# Patient Record
Sex: Male | Born: 1935
Health system: Southern US, Community
[De-identification: ages and names within clinical notes are randomized; demographics above are authoritative.]

## PROBLEM LIST (undated history)

## (undated) DIAGNOSIS — H9190 Unspecified hearing loss, unspecified ear: Secondary | ICD-10-CM

## (undated) DIAGNOSIS — C61 Malignant neoplasm of prostate: Secondary | ICD-10-CM

## (undated) DIAGNOSIS — K219 Gastro-esophageal reflux disease without esophagitis: Secondary | ICD-10-CM

## (undated) DIAGNOSIS — K5900 Constipation, unspecified: Secondary | ICD-10-CM

## (undated) DIAGNOSIS — I10 Essential (primary) hypertension: Secondary | ICD-10-CM

## (undated) DIAGNOSIS — E785 Hyperlipidemia, unspecified: Secondary | ICD-10-CM

## (undated) DIAGNOSIS — C439 Malignant melanoma of skin, unspecified: Secondary | ICD-10-CM

## (undated) DIAGNOSIS — R0789 Other chest pain: Secondary | ICD-10-CM

## (undated) HISTORY — DX: Essential (primary) hypertension: I10

## (undated) HISTORY — DX: Gastro-esophageal reflux disease without esophagitis: K21.9

## (undated) HISTORY — DX: Other chest pain: R07.89

## (undated) HISTORY — DX: Hyperlipidemia, unspecified: E78.5

## (undated) HISTORY — DX: Malignant melanoma of skin, unspecified: C43.9

## (undated) HISTORY — PX: APPENDECTOMY: SHX54

## (undated) HISTORY — DX: Constipation, unspecified: K59.00

## (undated) HISTORY — DX: Unspecified hearing loss, unspecified ear: H91.90

## (undated) HISTORY — PX: OTHER SURGICAL HISTORY: SHX169

## (undated) HISTORY — PX: POLYPECTOMY: SHX149

## (undated) HISTORY — PX: TONSILLECTOMY: SUR1361

## (undated) HISTORY — DX: Malignant neoplasm of prostate: C61

---

## 2001-05-10 ENCOUNTER — Emergency Department (HOSPITAL_COMMUNITY): Admission: EM | Admit: 2001-05-10 | Discharge: 2001-05-10 | Payer: Self-pay | Admitting: Emergency Medicine

## 2001-05-10 ENCOUNTER — Encounter: Payer: Self-pay | Admitting: Emergency Medicine

## 2002-06-22 ENCOUNTER — Encounter: Admission: RE | Admit: 2002-06-22 | Discharge: 2002-06-22 | Payer: Self-pay | Admitting: Internal Medicine

## 2002-06-22 ENCOUNTER — Encounter: Payer: Self-pay | Admitting: Internal Medicine

## 2004-04-08 ENCOUNTER — Ambulatory Visit: Payer: Self-pay | Admitting: Internal Medicine

## 2004-05-30 ENCOUNTER — Ambulatory Visit: Payer: Self-pay | Admitting: Internal Medicine

## 2004-11-07 ENCOUNTER — Ambulatory Visit: Payer: Self-pay | Admitting: Internal Medicine

## 2004-11-29 ENCOUNTER — Ambulatory Visit: Payer: Self-pay | Admitting: Internal Medicine

## 2005-03-14 ENCOUNTER — Encounter: Admission: RE | Admit: 2005-03-14 | Discharge: 2005-03-14 | Payer: Self-pay | Admitting: Internal Medicine

## 2005-03-14 ENCOUNTER — Ambulatory Visit: Payer: Self-pay | Admitting: Internal Medicine

## 2005-06-13 ENCOUNTER — Ambulatory Visit: Payer: Self-pay | Admitting: Internal Medicine

## 2005-06-19 ENCOUNTER — Ambulatory Visit: Payer: Self-pay | Admitting: Internal Medicine

## 2005-06-20 ENCOUNTER — Ambulatory Visit: Payer: Self-pay | Admitting: Internal Medicine

## 2005-09-02 ENCOUNTER — Encounter (INDEPENDENT_AMBULATORY_CARE_PROVIDER_SITE_OTHER): Payer: Self-pay | Admitting: Specialist

## 2005-09-02 ENCOUNTER — Ambulatory Visit (HOSPITAL_BASED_OUTPATIENT_CLINIC_OR_DEPARTMENT_OTHER): Admission: RE | Admit: 2005-09-02 | Discharge: 2005-09-02 | Payer: Self-pay | Admitting: Urology

## 2005-11-19 ENCOUNTER — Ambulatory Visit: Payer: Self-pay | Admitting: Internal Medicine

## 2005-12-15 ENCOUNTER — Ambulatory Visit: Payer: Self-pay | Admitting: Internal Medicine

## 2005-12-15 LAB — CONVERTED CEMR LAB
Calcium: 9.3 mg/dL (ref 8.4–10.5)
Creatinine, Ser: 1.2 mg/dL (ref 0.4–1.5)
Glucose, Bld: 98 mg/dL (ref 70–99)
Sodium: 135 meq/L (ref 135–145)

## 2006-04-10 ENCOUNTER — Encounter (HOSPITAL_COMMUNITY): Admission: RE | Admit: 2006-04-10 | Discharge: 2006-04-10 | Payer: Self-pay | Admitting: Urology

## 2006-05-19 DIAGNOSIS — I10 Essential (primary) hypertension: Secondary | ICD-10-CM

## 2006-05-19 HISTORY — DX: Essential (primary) hypertension: I10

## 2006-06-04 ENCOUNTER — Encounter: Payer: Self-pay | Admitting: Internal Medicine

## 2006-06-04 ENCOUNTER — Encounter (INDEPENDENT_AMBULATORY_CARE_PROVIDER_SITE_OTHER): Payer: Self-pay | Admitting: Specialist

## 2006-06-04 ENCOUNTER — Inpatient Hospital Stay (HOSPITAL_COMMUNITY): Admission: RE | Admit: 2006-06-04 | Discharge: 2006-06-10 | Payer: Self-pay | Admitting: Urology

## 2006-06-09 ENCOUNTER — Ambulatory Visit: Payer: Self-pay | Admitting: Internal Medicine

## 2006-06-26 ENCOUNTER — Encounter: Payer: Self-pay | Admitting: Internal Medicine

## 2006-07-01 ENCOUNTER — Encounter: Payer: Self-pay | Admitting: Internal Medicine

## 2006-07-01 DIAGNOSIS — Z9079 Acquired absence of other genital organ(s): Secondary | ICD-10-CM | POA: Insufficient documentation

## 2006-07-01 DIAGNOSIS — C61 Malignant neoplasm of prostate: Secondary | ICD-10-CM

## 2006-07-01 HISTORY — DX: Malignant neoplasm of prostate: C61

## 2006-07-15 ENCOUNTER — Encounter: Payer: Self-pay | Admitting: Internal Medicine

## 2006-08-05 ENCOUNTER — Ambulatory Visit: Payer: Self-pay | Admitting: Internal Medicine

## 2006-08-31 ENCOUNTER — Encounter: Payer: Self-pay | Admitting: Internal Medicine

## 2006-10-19 ENCOUNTER — Ambulatory Visit: Payer: Self-pay | Admitting: Gastroenterology

## 2006-10-30 ENCOUNTER — Ambulatory Visit: Payer: Self-pay | Admitting: Gastroenterology

## 2006-10-30 ENCOUNTER — Encounter: Payer: Self-pay | Admitting: Gastroenterology

## 2006-10-30 ENCOUNTER — Encounter: Payer: Self-pay | Admitting: Internal Medicine

## 2006-11-11 ENCOUNTER — Ambulatory Visit: Payer: Self-pay | Admitting: Internal Medicine

## 2006-12-22 ENCOUNTER — Telehealth (INDEPENDENT_AMBULATORY_CARE_PROVIDER_SITE_OTHER): Payer: Self-pay | Admitting: *Deleted

## 2006-12-30 ENCOUNTER — Encounter: Payer: Self-pay | Admitting: Internal Medicine

## 2007-03-10 ENCOUNTER — Ambulatory Visit: Payer: Self-pay | Admitting: Internal Medicine

## 2007-04-02 ENCOUNTER — Encounter: Payer: Self-pay | Admitting: Internal Medicine

## 2007-05-28 HISTORY — PX: PROSTATECTOMY: SHX69

## 2007-07-14 ENCOUNTER — Encounter: Payer: Self-pay | Admitting: Internal Medicine

## 2007-07-19 ENCOUNTER — Telehealth (INDEPENDENT_AMBULATORY_CARE_PROVIDER_SITE_OTHER): Payer: Self-pay | Admitting: *Deleted

## 2007-10-15 ENCOUNTER — Ambulatory Visit: Payer: Self-pay | Admitting: Internal Medicine

## 2007-10-15 LAB — HM COLONOSCOPY

## 2007-10-18 ENCOUNTER — Encounter: Payer: Self-pay | Admitting: Internal Medicine

## 2007-10-18 LAB — CONVERTED CEMR LAB: PSA: 0.02 ng/mL

## 2007-10-20 ENCOUNTER — Encounter: Payer: Self-pay | Admitting: Internal Medicine

## 2007-11-05 ENCOUNTER — Encounter: Payer: Self-pay | Admitting: Internal Medicine

## 2007-11-17 ENCOUNTER — Ambulatory Visit: Payer: Self-pay | Admitting: Internal Medicine

## 2007-11-28 HISTORY — PX: INGUINAL HERNIA REPAIR: SUR1180

## 2007-12-03 ENCOUNTER — Inpatient Hospital Stay (HOSPITAL_COMMUNITY): Admission: EM | Admit: 2007-12-03 | Discharge: 2007-12-04 | Payer: Self-pay | Admitting: Emergency Medicine

## 2007-12-03 ENCOUNTER — Ambulatory Visit: Payer: Self-pay | Admitting: Internal Medicine

## 2007-12-06 ENCOUNTER — Ambulatory Visit: Payer: Self-pay | Admitting: Internal Medicine

## 2007-12-10 ENCOUNTER — Ambulatory Visit: Payer: Self-pay | Admitting: Internal Medicine

## 2007-12-14 ENCOUNTER — Encounter (INDEPENDENT_AMBULATORY_CARE_PROVIDER_SITE_OTHER): Payer: Self-pay | Admitting: *Deleted

## 2007-12-14 LAB — CONVERTED CEMR LAB
Calcium: 9.7 mg/dL (ref 8.4–10.5)
Creatinine, Ser: 1.3 mg/dL (ref 0.4–1.5)
GFR calc non Af Amer: 58 mL/min
Glucose, Bld: 82 mg/dL (ref 70–99)
Potassium: 4.7 meq/L (ref 3.5–5.1)

## 2007-12-17 ENCOUNTER — Ambulatory Visit (HOSPITAL_COMMUNITY): Admission: RE | Admit: 2007-12-17 | Discharge: 2007-12-17 | Payer: Self-pay | Admitting: General Surgery

## 2008-01-03 ENCOUNTER — Encounter: Payer: Self-pay | Admitting: Internal Medicine

## 2008-01-14 ENCOUNTER — Ambulatory Visit: Payer: Self-pay | Admitting: Internal Medicine

## 2008-01-19 ENCOUNTER — Encounter: Payer: Self-pay | Admitting: Internal Medicine

## 2008-06-05 ENCOUNTER — Ambulatory Visit: Payer: Self-pay | Admitting: Internal Medicine

## 2008-10-07 ENCOUNTER — Ambulatory Visit: Payer: Self-pay | Admitting: Family Medicine

## 2008-10-18 ENCOUNTER — Encounter: Payer: Self-pay | Admitting: Family Medicine

## 2008-10-25 ENCOUNTER — Ambulatory Visit: Payer: Self-pay | Admitting: Internal Medicine

## 2008-10-31 ENCOUNTER — Telehealth: Payer: Self-pay | Admitting: Internal Medicine

## 2008-11-29 ENCOUNTER — Telehealth (INDEPENDENT_AMBULATORY_CARE_PROVIDER_SITE_OTHER): Payer: Self-pay | Admitting: *Deleted

## 2009-01-17 ENCOUNTER — Ambulatory Visit: Payer: Self-pay | Admitting: Internal Medicine

## 2009-01-22 ENCOUNTER — Encounter (INDEPENDENT_AMBULATORY_CARE_PROVIDER_SITE_OTHER): Payer: Self-pay | Admitting: *Deleted

## 2009-01-22 LAB — CONVERTED CEMR LAB
AST: 26 units/L (ref 0–37)
CO2: 31 meq/L (ref 19–32)
Calcium: 9.5 mg/dL (ref 8.4–10.5)
Creatinine, Ser: 1 mg/dL (ref 0.4–1.5)
Direct LDL: 148.2 mg/dL
GFR calc non Af Amer: 77.48 mL/min (ref >60)
Sodium: 134 meq/L — ABNORMAL LOW (ref 135–145)
TSH: 2.72 microintl units/mL (ref 0.35–5.50)
Total CHOL/HDL Ratio: 5
VLDL: 35.2 mg/dL (ref 0.0–40.0)

## 2009-07-18 ENCOUNTER — Ambulatory Visit: Payer: Self-pay | Admitting: Internal Medicine

## 2009-07-18 DIAGNOSIS — E785 Hyperlipidemia, unspecified: Secondary | ICD-10-CM

## 2009-07-18 HISTORY — DX: Hyperlipidemia, unspecified: E78.5

## 2009-07-24 LAB — CONVERTED CEMR LAB
Cholesterol: 216 mg/dL — ABNORMAL HIGH (ref 0–200)
Direct LDL: 150.7 mg/dL
HDL: 46.5 mg/dL (ref >39.00)
PSA: 0.01 ng/mL — ABNORMAL LOW (ref 0.10–4.00)
Total CHOL/HDL Ratio: 5

## 2009-07-25 ENCOUNTER — Telehealth (INDEPENDENT_AMBULATORY_CARE_PROVIDER_SITE_OTHER): Payer: Self-pay | Admitting: *Deleted

## 2009-10-30 ENCOUNTER — Ambulatory Visit: Payer: Self-pay | Admitting: Internal Medicine

## 2009-12-17 ENCOUNTER — Telehealth: Payer: Self-pay | Admitting: Internal Medicine

## 2010-01-03 ENCOUNTER — Telehealth: Payer: Self-pay | Admitting: Internal Medicine

## 2010-02-01 ENCOUNTER — Telehealth (INDEPENDENT_AMBULATORY_CARE_PROVIDER_SITE_OTHER): Payer: Self-pay | Admitting: *Deleted

## 2010-02-24 LAB — CONVERTED CEMR LAB
AST: 24 units/L (ref 0–37)
BUN: 13 mg/dL (ref 6–23)
CO2: 31 meq/L (ref 19–32)
Chloride: 103 meq/L (ref 96–112)
Cholesterol: 208 mg/dL (ref 0–200)
Creatinine, Ser: 1.2 mg/dL (ref 0.4–1.5)
Eosinophils Absolute: 0.3 10*3/uL (ref 0.0–0.7)
GFR calc Af Amer: 76 mL/min
GFR calc non Af Amer: 63 mL/min
Hemoglobin: 14.2 g/dL (ref 13.0–17.0)
MCHC: 34.6 g/dL (ref 30.0–36.0)
MCV: 95.5 fL (ref 78.0–100.0)
Monocytes Absolute: 0.9 10*3/uL (ref 0.1–1.0)
Monocytes Relative: 13.2 % — ABNORMAL HIGH (ref 3.0–12.0)
Neutrophils Relative %: 48.6 % (ref 43.0–77.0)
RBC: 4.31 M/uL (ref 4.22–5.81)
Sodium: 137 meq/L (ref 135–145)
VLDL: 20 mg/dL (ref 0–40)
WBC: 6.9 10*3/uL (ref 4.5–10.5)

## 2010-02-25 ENCOUNTER — Other Ambulatory Visit: Payer: Self-pay | Admitting: Internal Medicine

## 2010-02-25 ENCOUNTER — Ambulatory Visit
Admission: RE | Admit: 2010-02-25 | Discharge: 2010-02-25 | Payer: Self-pay | Source: Home / Self Care | Attending: Internal Medicine | Admitting: Internal Medicine

## 2010-02-25 LAB — LIPID PANEL
HDL: 49.8 mg/dL (ref >39.00)
Total CHOL/HDL Ratio: 4
Triglycerides: 106 mg/dL (ref 0.0–149.0)
VLDL: 21.2 mg/dL (ref 0.0–40.0)

## 2010-02-25 LAB — AST: AST: 29 U/L (ref 0–37)

## 2010-02-25 LAB — BASIC METABOLIC PANEL
BUN: 16 mg/dL (ref 6–23)
GFR: 72.22 mL/min (ref >60.00)

## 2010-02-25 LAB — CBC WITH DIFFERENTIAL/PLATELET
HCT: 41.7 % (ref 39.0–52.0)
Hemoglobin: 14.7 g/dL (ref 13.0–17.0)
MCHC: 35.3 g/dL (ref 30.0–36.0)
MCV: 93 fl (ref 78.0–100.0)
Monocytes Relative: 12.8 % — ABNORMAL HIGH (ref 3.0–12.0)
Neutro Abs: 3.9 10*3/uL (ref 1.4–7.7)
Neutrophils Relative %: 53.2 % (ref 43.0–77.0)
RDW: 13.8 % (ref 11.5–14.6)

## 2010-02-26 NOTE — Assessment & Plan Note (Signed)
Summary: FLU SHOT//PH   Nurse Visit  CC: Flu shot./kb   Allergies: 1)  ! Keflex  Orders Added: 1)  Flu Vaccine 32yrs + MEDICARE PATIENTS [Q2039] 2)  Administration Flu vaccine - MCR [G0008]          Flu Vaccine Consent Questions     Do you have a history of severe allergic reactions to this vaccine? no    Any prior history of allergic reactions to egg and/or gelatin? no    Do you have a sensitivity to the preservative Thimersol? no    Do you have a past history of Guillan-Barre Syndrome? no    Do you currently have an acute febrile illness? no    Have you ever had a severe reaction to latex? no    Vaccine information given and explained to patient? yes    Are you currently pregnant? no    Lot Number:[id number]   Exp Date:07/27/2010   Site Given  Left Deltoid IMu

## 2010-02-26 NOTE — Progress Notes (Signed)
Summary: Patient Returning Call  Phone Note Outgoing Call Call back at [facility] Phone [phone] Call back at Work Phone [phone]   Call placed by: Shonna Chock,  July 25, 2009 7:54 AM Call placed to: Patient Summary of Call: Message left on Triage VM: Late yesterday, please call  I spoke with patient and informed him labs were normal, copy printed off and mailed to patient, patient ok'd.Shonna Chock  July 25, 2009 7:55 AM

## 2010-02-26 NOTE — Assessment & Plan Note (Signed)
Summary: 6 MONTH OV//PH   Vital Signs:  Patient profile:   77 year old male Height:      64 inches Weight:      148 pounds Temp:     98.2 degrees F oral Pulse rate:   66 / minute BP sitting:   130 / 70  (left arm)  Vitals Entered By: Jeremy Johann CMA (July 18, 2009 8:06 AM) CC: 6 month f/u Comments --fasting ---discuss pain in rt ear REVIEWED MED LIST, PATIENT AGREED DOSE AND INSTRUCTION CORRECT    History of Present Illness: routine office visit occ pain in the right side of the neck, on and off, starts in the back of the ear and goes down. No ear discharge, unclear if the pain is worse with neck motion   Allergies: 1)  ! Keflex  Past History:  Past Medical History: Reviewed history from 01/17/2009 and no changes required. Hypertension Prostate cancer, hx of (2008) ?gout (see 09-2008 note) addominal pain: admited 11-09, due to constipation and ?pancreatitis   Past Surgical History: Reviewed history from 01/14/2008 and no changes required. Appendectomy Tonsillectomy neg cardiolite but had tachycardia and couplets (09/2003).Marland KitchenMarland KitchenRx toprol rt middle ear surgery Prostatectomy (05/2007), LADs (-), bone scan (-) RIGHT Inguinal herniorrhaphy  (11-09)  Social History: Reviewed history from 06/05/2008 and no changes required. Married no children, wife has  retired-- worked for the [employer] exercise: golf 2/week, treadmill at home sometimes, active diet-- ok , eats at home most times  Review of Systems       -no further episodes of gout - amb  BPs within normal -the patient remains active without difficulty breathing -Denies dysuria, hematuria, occasionally has a bladder leak if his bladder is full  Physical Exam  General:  alert, well-developed, and well-nourished.   Ears:  no redness or discharge on either ear. No tenderness to percussion at the mastoid area Neck:  full ROM and no masses.   Lungs:  normal respiratory effort, no intercostal retractions, no  accessory muscle use, and normal breath sounds.   Heart:  normal rate, regular rhythm, and no murmur.     Impression & Recommendations:  Problem # 1:  HYPERLIPIDEMIA, MILD (ICD-272.4)  slight increased LDL Diet discussed Labs  Labs Reviewed: SGOT: 26 (01/17/2009)   SGPT: 23 (01/17/2009)   HDL:46.00 (01/17/2009), 37.8 (10/15/2007)  LDL:DEL (10/15/2007)  Chol:227 (01/17/2009), 208 (10/15/2007)  Trig:176.0 (01/17/2009), 101 (10/15/2007)  Orders: Venipuncture (16109) TLB-Lipid Panel (80061-LIPID)  Problem # 2:  ADENOCARCINOMA, PROSTATE (ICD-185)  does not see Dr. Annabell Howells   routinely Would like to check his PSAs here, we agreed to do that every 6 months for now  Orders: TLB-PSA (Prostate Specific Antigen) (84153-PSA)  Problem # 3:  HYPERTENSION (ICD-401.9) at goal  ambulatory BP is normal His updated medication list for this problem includes:    Hydrochlorothiazide 25 Mg Tabs (Hydrochlorothiazide) .Marland Kitchen... Take 1 tablet by mouth once a day    Toprol Xl 25 Mg Tb24 (Metoprolol succinate) .Marland Kitchen... Take 1 tablet by mouth once a day  BP today: 130/70 Prior BP: 120/88 (01/17/2009)  Labs Reviewed: K+: 3.7 (01/17/2009) Creat: : 1.0 (01/17/2009)   Chol: 227 (01/17/2009)   HDL: 46.00 (01/17/2009)   LDL: DEL (10/15/2007)   TG: 176.0 (01/17/2009)  Problem # 4:  right-sided neck pain exam negative, patient Will call if the symptoms worsen  Complete Medication List: 1)  Hydrochlorothiazide 25 Mg Tabs (Hydrochlorothiazide) .... Take 1 tablet by mouth once a day 2)  Toprol Xl 25 Mg  Tb24 (Metoprolol succinate) .... Take 1 tablet by mouth once a day 3)  Miralax Powd (Polyethylene glycol 3350) .... Once weekly 4)  Mvi  .... One a day 5)  Potassium Over-the-counter 99 Mg  .... One a day 6)  Baby Aspirin 81 Mg Chew (Aspirin) .Marland Kitchen.. 1 p o  daily  Patient Instructions: 1)  Please schedule a follow-up appointment in 6 months .

## 2010-02-26 NOTE — Progress Notes (Signed)
Summary: EYE MD recommendation  Phone Note Call from Patient   Details for Reason: ok to leave message on machine Summary of Call: Patient left message on triage requesting the name of an eye doctor (not optician). Please advise. Initial call taken by: Lucious Groves CMA,  January 03, 2010 10:57 AM  Follow-up for Phone Call        Dr Hazle Quant or one of his associates Follow-up by: Nolon Rod. Paz MD,  January 03, 2010 3:54 PM  Additional Follow-up for Phone Call Additional follow up Details #1::        Patient notified. Additional Follow-up by: Lucious Groves CMA,  January 03, 2010 4:40 PM

## 2010-02-26 NOTE — Progress Notes (Signed)
Summary: recommendation  Phone Note Call from Patient Call back at Home Phone [phone]   Summary of Call: Patient left message on triage requesting the name of an ear doctor to go to for an ear problem. Patient did not leave any additional info. Please advise. Initial call taken by: Lucious Groves CMA,  December 17, 2009 9:53 AM  Follow-up for Phone Call        [facility], phone  [phone] any of the doctors there will be fine Follow-up by: [facility] E. Paz MD,  December 17, 2009 4:53 PM  Additional Follow-up for Phone Call Additional follow up Details #1::        Patient notified and will call their office. Additional Follow-up by: Lucious Groves CMA,  December 18, 2009 8:53 AM

## 2010-02-28 NOTE — Progress Notes (Signed)
Summary: ?Accept family member as a pt  Phone Note Call from Patient Call back at Home Phone [phone]   Summary of Call: Patient left message on triage that his 77 yo sister needs to find a MD. Her current MD is closing and he would like to know if Drue Novel will take her as a patient. He notes that she is sick and he would like her seen today if possible. Please advise on if you can accept the patient.  Initial call taken by: Lucious Groves CMA,  February 01, 2010 9:29 AM  Follow-up for Phone Call        Patient walked in and sister has Medicare/AARP insurance. Brother states that she has congestion and cold and is just worried becuase of her age.Marland KitchenMarland KitchenHarold Barban  February 01, 2010 9:33 AM  Additional Follow-up for Phone Call Additional follow up Details #1::        Spoke with Dr. Drue Novel and he agreed to take patient as a new medicare patient. Schedule is booked for today but Mr. Godinho will call back to see about Monday if she isnt any better.  Additional Follow-up by: Harold Barban,  February 01, 2010 9:46 AM

## 2010-03-06 NOTE — Assessment & Plan Note (Signed)
Summary: cpx//pt will be fasting//lch   Vital Signs:  Patient profile:   77 year old male Height:      64 inches Weight:      154 pounds BMI:     26.53 Pulse rate:   78 / minute Pulse rhythm:   regular BP sitting:   128 / 84  (left arm) Cuff size:   regular  Vitals Entered By: Army Fossa CMA (February 25, 2010 9:37 AM) CC: CPX, fasting  Comments no complaints  discuss labs [facility]   History of Present Illness: Here for Medicare AWV:  1.   Risk factors based on Past M, S, F history: reviewed  2.   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 3.   Depression/mood: no problems noted or reported  4.   Hearing: R hear decreased > 40% x years , uses a hearing aid . No problems @ normal                             conversation  5.   ADL's: independent  6.   Fall Risk: no recent, low risk  7.   Home Safety: does feels safe at home  8.   Height, weight, &visual acuity: see VS, sees eye doctor yearly, wear glasses  9.   Counseling: yes  10.   Labs ordered based on risk factors: yes 11.           Referral Coordination, if needed 12.           Care Plan, see assessment and plan 13.            Cognitive Assessment: cognition & motor skills seem appropriate. Good memory  in addition, we discussed the following Hypertension-- good medication compliance , ambulatory BPs wnl  Prostate cancer,  does not see urology routinely  ?gout  -- no episodes   Current Medications (verified): 1)  Hydrochlorothiazide 25 Mg Tabs (Hydrochlorothiazide) .... Take 1 Tablet By Mouth Once A Day. Due For Ov. 2)  Toprol Xl 25 Mg Tb24 (Metoprolol Succinate) .... Take 1 Tablet By Mouth Once A Day. Due For Ov. 3)  Miralax  Powd (Polyethylene Glycol 3350) .... Once Weekly 4)  Mvi .... One A Day 5)  Potassium Over-The-Counter 99 Mg .... One A Day 6)  Baby Aspirin 81 Mg  Chew (Aspirin) .Marland Kitchen.. 1 P O  Daily  Allergies (verified): 1)  ! Keflex  Past History:  Past Medical  History: Hypertension Prostate cancer, hx of (2008) ?gout (see 09-2008 note) R hear decreased > 40% x years  addominal pain: admited 11-09, due to constipation and ?pancreatitis   Past Surgical History: Reviewed history from 01/14/2008 and no changes required. Appendectomy Tonsillectomy neg cardiolite but had tachycardia and couplets (09/2003).Marland KitchenMarland KitchenRx toprol rt middle ear surgery Prostatectomy (05/2007), LADs (-), bone scan (-) RIGHT Inguinal herniorrhaphy  (11-09)  Family History: Reviewed history from 10/15/2007 and no changes required. Father: esophageal Ca,  prostate ca-- no colon ca--no  HTN--M DM--M CAD--M (CHF, CABG 79y/0) MGM - MI   Social History: Married no children, wife has 3 daughters   retired-- worked for the [employer] exercise: golf 2/week, treadmill at home sometimes, active diet-- ok , eats at home most times; likes pizza, pork   Review of Systems General:  Denies fatigue, fever, and weight loss. CV:  Denies chest pain or discomfort and swelling of feet. Resp:  Denies cough, shortness of breath,  and sputum productive. GI:  Denies bloody stools, diarrhea, and nausea. GU:  Denies dysuria and hematuria; occasionally "drips" since prostate ca surgery .  Physical Exam  General:  alert, well-developed, and well-nourished.   Neck:  full ROM and no masses.  normal carotid pulses, no thyromegaly Lungs:  normal respiratory effort, no intercostal retractions, no accessory muscle use, and normal breath sounds.   Heart:  normal rate, regular rhythm, and no murmur.   Abdomen:  soft, non-tender, no distention, and no masses.  no bruit  Extremities:  no pretibial edema bilaterally  Neurologic:  alert & oriented X3, strength normal in all extremities, and gait normal.   Psych:  Cognition and judgment appear intact. Alert and cooperative with normal attention span and concentration.  not anxious appearing and not depressed appearing.     Impression &  Recommendations:  Problem # 1:  HEALTH SCREENING (ICD-V70.0) Td 2003 pneumonia shot 2009 shingles shot 2-09 flu shot +  very healthy life style, praised, counseled   Cscope 10-08, Adenomatous polyp, next 2013 Dr Arlyce Dice  diet and exercise discussed  Orders: Specimen Handling (46962) Medicare -1st Annual Wellness Visit [phone])  Problem # 2:  HYPERLIPIDEMIA, MILD (ICD-272.4) all previous labs discussed, LDL goal 130 consider meds if LDL increases , otherwise states will try to work on a even healthier diet  Labs Reviewed: SGOT: 26 (01/17/2009)   SGPT: 23 (01/17/2009)   HDL:46.50 (07/18/2009), 46.00 (01/17/2009)  LDL:DEL (10/15/2007)  Chol:216 (07/18/2009), 227 (01/17/2009)  Trig:113.0 (07/18/2009), 176.0 (01/17/2009)  Orders: Venipuncture (13244) TLB-Lipid Panel (80061-LIPID) TLB-ALT (SGPT) (84460-ALT) TLB-AST (SGOT) (84450-SGOT) Specimen Handling (01027)  Problem # 3:  ADENOCARCINOMA, PROSTATE (ICD-185) labs   Orders: TLB-PSA (Prostate Specific Antigen) (84153-PSA) Specimen Handling (25366)  Problem # 4:  ? of GOUT, UNSPECIFIED (ICD-274.9) asx   Complete Medication List: 1)  Hydrochlorothiazide 25 Mg Tabs (Hydrochlorothiazide) .... Take 1 tablet by mouth once a day. due for ov. 2)  Toprol Xl 25 Mg Tb24 (Metoprolol succinate) .... Take 1 tablet by mouth once a day. due for ov. 3)  Miralax Powd (Polyethylene glycol 3350) .... Once weekly 4)  Mvi  .... One a day 5)  Potassium Over-the-counter 99 Mg  .... One a day 6)  Baby Aspirin 81 Mg Chew (Aspirin) .Marland Kitchen.. 1 p o  daily  Other Orders: TLB-BMP (Basic Metabolic Panel-BMET) (80048-METABOL) TLB-CBC Platelet - w/Differential (85025-CBCD)  Patient Instructions: 1)  Please schedule a follow-up appointment in 6 months .    Orders Added: 1)  Venipuncture [36415] 2)  TLB-Lipid Panel [80061-LIPID] 3)  TLB-BMP (Basic Metabolic Panel-BMET) [80048-METABOL] 4)  TLB-ALT (SGPT) [84460-ALT] 5)  TLB-AST (SGOT) [84450-SGOT] 6)   TLB-PSA (Prostate Specific Antigen) [84153-PSA] 7)  TLB-CBC Platelet - w/Differential [85025-CBCD] 8)  Specimen Handling [99000] 9)  Medicare -1st Annual Wellness Visit [G0438] 10)  Est. Patient Level III [44034]

## 2010-03-13 ENCOUNTER — Other Ambulatory Visit: Payer: Self-pay | Admitting: Internal Medicine

## 2010-03-13 ENCOUNTER — Encounter (INDEPENDENT_AMBULATORY_CARE_PROVIDER_SITE_OTHER): Payer: Self-pay | Admitting: *Deleted

## 2010-03-13 ENCOUNTER — Other Ambulatory Visit (INDEPENDENT_AMBULATORY_CARE_PROVIDER_SITE_OTHER): Payer: Medicare Other

## 2010-03-13 DIAGNOSIS — I10 Essential (primary) hypertension: Secondary | ICD-10-CM

## 2010-03-13 LAB — BASIC METABOLIC PANEL
CO2: 32 mEq/L (ref 19–32)
Chloride: 97 mEq/L (ref 96–112)
Creatinine, Ser: 1.1 mg/dL (ref 0.4–1.5)
Glucose, Bld: 95 mg/dL (ref 70–99)
Potassium: 4.7 mEq/L (ref 3.5–5.1)
Sodium: 136 mEq/L (ref 135–145)

## 2010-03-19 ENCOUNTER — Encounter: Payer: Self-pay | Admitting: Internal Medicine

## 2010-04-04 NOTE — Consult Note (Signed)
Summary: chronic mastoiditis, otitis---ENT  [facility]   Imported By: Lanelle Bal 03/26/2010 13:45:31  _____________________________________________________________________  External Attachment:    Type:   Image     Comment:   External Document

## 2010-05-18 ENCOUNTER — Ambulatory Visit (INDEPENDENT_AMBULATORY_CARE_PROVIDER_SITE_OTHER): Payer: Medicare Other | Admitting: Family Medicine

## 2010-05-18 VITALS — BP 108/62 | HR 66 | Temp 98.0°F | Wt 148.4 lb

## 2010-05-18 DIAGNOSIS — K529 Noninfective gastroenteritis and colitis, unspecified: Secondary | ICD-10-CM

## 2010-05-18 DIAGNOSIS — K5289 Other specified noninfective gastroenteritis and colitis: Secondary | ICD-10-CM

## 2010-05-18 NOTE — Patient Instructions (Signed)
Prevent dehydration: drink Gatorade, Pedialyte, Ginger Ale, popsicles  Immodium A-D over ther counter or Pepto-Bismol   day 1: clear liquids day 1: clear liquids--2-3 oz every 45-60 min. SMALL SIPS OR ICE CHIPS 7-up, ginger ale, sprite tea--no cofee chicken broth plain jello Water  Day 2: contnue clear liquids and add BRAT diet B--banana R--rice---can use chicken noodle or rice soup A--apple sauce T--dry toast GRITS, CREAM OF WHEAT, OATMEAL OK  Day 3: continue day 2 and add simple, non fat non spicy foods1 at a time to diet as canned peaches or pears backed or broiled chicken breast---or lunch meat boiled white potato-cook in chicken broth Chicken and rice or chicken noodles  

## 2010-05-18 NOTE — Progress Notes (Signed)
  Subjective:    Patient ID: Michael Johns, male    DOB: October 15, 1932, 77 y.o.   MRN: [id number]  HPI Since Tues, sever  Diarrhea. And also had some bad stomache. No known exposures. Urinating.   Had to get up thre times last night.  10 times or so in the last day    Review of Systems     Objective:   Physical Exam        Assessment & Plan:   Subjective:     Michael Johns is a 77 y.o. male who presents for evaluation of diarrhea 10 times per day. Symptoms have been present for 5 days. Patient denies blood in stool, constipation, dark urine, dysuria, fever, heartburn, hematemesis, hematuria and melena. Patient's oral intake has been normal. Patient's urine output has been adequate. Other contacts with similar symptoms include: none. Patient denies recent travel history. Patient has not had recent ingestion of possible contaminated food, toxic plants, or inappropriate medications/poisons.   The following portions of the patient's history were reviewed and updated as appropriate: current medications and problem list.  Review of Systems Pertinent items are noted in HPI.    Objective:   GEN: WDWN, NAD, Non-toxic, A & O x 3 HEENT: Atraumatic, Normocephalic. Neck supple. No masses, No LAD. Ears and Nose: No external deformity. CV: RRR, No M/G/R. No JVD. No thrill. No extra heart sounds. PULM: CTA B, no wheezes, crackles, rhonchi. No retractions. No resp. distress. No accessory muscle use. ABD: S, NT, ND, HYPERACTIVE BS. No rebound tenderness. No HSM.  EXTR: No c/c/e NEURO Normal gait.  PSYCH: Normally interactive. Conversant. Not depressed or anxious appearing.  Calm demeanor.     Assessment:    Acute Gastroenteritis    Plan:    1. Discussed oral rehydration, reintroduction of solid foods, signs of dehydration. 2. Return or go to emergency department if worsening symptoms, blood or bile, signs of dehydration, diarrhea lasting longer than 5 days or any new concerns. 3. Follow  up in 5 days or sooner as needed.  If no improvement

## 2010-06-11 NOTE — Discharge Summary (Signed)
NAMEPARIS, Michael Johns                 ACCOUNT NO.:  [id number]   MEDICAL RECORD NO.:  [id number]          PATIENT TYPE:  INP   LOCATION:  5123                         FACILITY:  [facility]   PHYSICIAN:  Georgina Quint. Plotnikov, MDDATE OF BIRTH:  [birth year]   DATE OF ADMISSION:  12/02/2007  DATE OF DISCHARGE:                               DISCHARGE SUMMARY   DISCHARGE MEDICATIONS:  1. Peri-Colace 1 capsule b.i.d.  2. MiraLax 17 gm p.o. once daily p.r.n. constipation.   Resume other home medications.   FOLLOW-UP PLAN:  Dr. Drue Novel next week with BMET drawing.   SPECIAL INSTRUCTIONS:  1. Avoid alcohol for one month.  2. Call if problems.  3. Resume activity slowly.   DISCHARGE DIAGNOSES:  1. Abdominal pain due to acute pancreatitis, resolved.  2. Constipation with stool impaction, resolved.  3. Hyponatremia.  4. Splenic mass on ultrasound, which proved to be a part of distended      stomach on CAT scan.   HISTORY:  Patient is a 77 year old male who was admitted on November 6  by Dr. Janee Morn with abdominal pain, chemical pancreatitis,  hyponatremia, and obstipation.  He did have severe upper abdominal  epigastric pain.  For further details, please see the rest of his  history and physical on December 03, 2007.   HOSPITAL COURSE:  During the course of hospitalization, he was kept in  IV fluids and clear liquids.  His condition has improved.  He has a  large bowel movement with a laxative.  He was found to have elevated  lipase that resolved.  He was found to have a splenic mass and  ultrasound that proved to be a distended stomach on the CAT scan.   PHYSICAL EXAMINATION:  On the day of discharge, he was feeling well.  He  was able to tolerate clear liquids.  Blood pressure 131/71, respirations 20, heart rate 59, temp 97.6.  Sats  97% on room air.  He moves well.  HEENT:  Moist.  NECK:  Supple.  LUNGS:  Clear.  HEART:  Regular.  ABDOMEN:  Soft and nontender.  No organomegaly or masses  felt.  LOWER EXTREMITIES:  Without edema.  He is alert, oriented and  cooperative.   If he tolerates a regular lunch, we will discharge home today.      Georgina Quint. Plotnikov, MD  Electronically Signed     AVP/MEDQ  D:  12/04/2007  T:  12/04/2007  Job:  [id number]

## 2010-06-11 NOTE — H&P (Signed)
[name], Michael Johns                 ACCOUNT NO.:  [id number]   MEDICAL RECORD NO.:  [id number]          PATIENT TYPE:  INP   LOCATION:  1425                         FACILITY:  [facility]   PHYSICIAN:  Excell Seltzer. Annabell Howells, M.D.    DATE OF BIRTH:  18-Oct-1932   DATE OF ADMISSION:  06/04/2006  DATE OF DISCHARGE:                              HISTORY & PHYSICAL   HISTORY OF PRESENT ILLNESS:  [name] is a 77 year old white male who  initially presented with an elevated PSA.  He was found to have a T1C  Gleason's 73 +4 adenocarcinoma of prostate involving the right base of  the prostate and he has elected to undergo radical prostatectomy.   PAST HISTORY:  1. Hypertension.  2. BPH.   SURGICAL HISTORY:  1. Middle ear surgery in 1962.  2. Appendectomy at age 89.  3 . Tonsillectomy at age 84.  1. Prostate, ultrasound and biopsy in August 2007.  2. 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   CURRENT MEDICATIONS:  1. Hydrochlorothiazide 25 mg daily.  2. Doxazosin 4 mg daily.  3. Metoprolol 12-1/2 mg daily.  4. Aspirin 81 mg daily.  5. Potassium gluconate 99 mg daily.  6. Multivitamin daily.   ALLERGIES:  He has no drug allergies.   SOCIAL HISTORY:  Negative for tobacco use.  Drinks occasional alcohol.   FAMILY HISTORY:  Unremarkable.   REVIEW OF SYSTEMS:  He has no chest pains, shortness breath, GI  complaints, but does have a history of some voiding difficulties,  otherwise without complaints.   PHYSICAL EXAMINATION:  VITAL SIGNS: Blood pressure is 108/65, heart 77,  temperature 96.8.  GENERAL: He is a well well-nourished white male with stress alert and  x3.  HEENT: Normocephalic, atraumatic.  NECK: Supple without thyromegaly or bruits.  LUNGS: Clear.  HEART: Regular rate and rhythm.  ABDOMEN: Soft, flat and nontender without mass and hepatosplenomegaly or  CVA tenderness.  No hernias or inguinal adenopathy noted.  GU: Exam reveals an unremarkable  phallus an adequate meatus.  Scrotum is  unremarkable.  Testicles bilaterally descended normal in size and  consistency without mass or tenderness.  Epididymis unremarkable.  Anus  and perineum without lesions.  RECTAL:  Reveals normal sphincter tone.  Prostate is high-riding, 2+ in  size and benign consistency without nodules. Seminal vesicles is  nonpalpable.  No rectal masses are noted.  EXTREMITIES:  Full range of motion without edema.  NEUROLOGIC:  Grossly intact.  SKIN:  Warm and dry.   IMPRESSION:  Gleason 7 T1C adenocarcinoma of prostate is 12, history of  BPH, bladder outlet obstruction.   PLAN:  He is to be admitted for robot assisted radical prostatectomy  with bilateral pelvic lymphadenectomy.      Excell Seltzer. Annabell Howells, M.D.  Electronically Signed     JJW/MEDQ  D:  07/30/2006  T:  07/31/2006  Job:  [id number]

## 2010-06-11 NOTE — Discharge Summary (Signed)
[name], Michael Johns                 ACCOUNT NO.:  [id number]   MEDICAL RECORD NO.:  [id number]          PATIENT TYPE:  INP   LOCATION:  1425                         FACILITY:  [facility]   PHYSICIAN:  Excell Seltzer. Michael Johns, M.D.    DATE OF BIRTH:  1932-09-03   DATE OF ADMISSION:  06/04/2006  DATE OF DISCHARGE:  06/10/2006                               DISCHARGE SUMMARY   HISTORY OF PRESENT ILLNESS:  [name] is a 77 year old, white male with  a T1c Gleason 7 adenocarcinoma of the prostate who is admitted for  radical prostatectomy.   PAST MEDICAL HISTORY:  Significant for hypertension.  Please see his H&P  for details.   ADMISSION MEDICATIONS:  1. Hydrochlorothiazide 25 mg daily.  2. Doxazosin 4 mg daily.  3. Metoprolol 12.5 mg daily.  4. Aspirin 81 mg daily.  5. Potassium gluconate.  6. Multivitamin.   ALLERGIES:  No known drug allergies.   HOSPITAL COURSE:  The patient was taken to the operating room on the day  of admission and underwent an uneventful robotic-assisted radical  prostatectomy and bilateral pelvic lymphadenectomy.  His blood loss was  estimated at 200 mL.  Postoperatively, he did well.  On the evening of  surgery, he had good urine output and was afebrile.  However, he began  to have some bloody drainage with clots later that evening and required  irrigation of his catheter.  On the postop day #1, he was doing well.  He was afebrile.  His abdomen was soft with bowel sounds.  His JP output  was decreasing.  His urine output was felt to be adequate following  irrigation.  His hemoglobin was 11.5.  His Jackson-Pratt was removed.  However, later that day, the patient had the new-onset of severe lower  abdominal pain with rectal urgency.  The catheter was able to be  irrigated quantitatively with return of a small clot.  A CBC and CMP  were obtained.  A bladder scan was repeated which revealed no  significant residual and it was felt a CT of the abdomen with contrast  to rule  out bowel injury was indicated.  His hemoglobin was down to 9.8.  The initial CT scan report was not read as revealing extravasation and  the patient was managed over the weekend for an ileus.  During that  time, he remained afebrile, but continued to have pain and a persistent  ileus.  On postop day #4, his temperature was 97.9, pulse was regular,  respirations 22, blood pressure 127/57.  I reviewed his CT scan and  noted that he had significant extravasation of contrast of urine at the  anastomosis along with a persistent ileus.  He had been taken off his  Cipro, that was resumed.  A repeat CBC and CMP were obtained along with  order for a CT cystogram.  A medical consult was obtained because the  patient was found to have hyponatremia with a sodium of 122 on May 11.  He also was noted to have some hypokalemia that was felt to be secondary  to his  hydrochlorothiazide.  He was given Lasix and IV fluids.  On the  evening of May 12, his sodium increased to 127.  He was passing gas and  feeling better.  The CT cystogram revealed some mild extravasation at  the posterior bladder neck and anastomotic area with diminished  retroperitoneal fluid and a stable hematoma in the retropubic space.  His IV had infiltrated and was removed.  At this point, we felt we could  safely manage him with oral intake.  His diet was initiated and  advanced.  He was changed to p.o. Cipro.  A repeat BMP and CBC were  obtained on May 13, with his hemoglobin 8.9.  Sodium was up to 132,  potassium was 2.9.  He was given potassium supplement and changed to a  regular diet and had continued medical followup.  On May 14, his T-max  was 100.4, but he was afebrile that morning.  His vital signs were  stable.  His exam was unremarkable with reduced suprapubic tenderness.  His sodium was up to 131 and his potassium was up to 3.8.  His final  pathology returned consistent with a T2c, Gleason 7, N0 MX  adenocarcinoma of the  prostate.  He was felt to be ready for discharge  home.  His admission was complicated by an anastomotic leak likely  related to irrigation from clot retention.  He had acute blood loss  anemia, a postoperative ileus, hyponatremia and hypokalemia.   DISCHARGE MEDICATIONS:  Discharge medications include Colace, Vicodin,  Keflex, Cipro and resumption of his home medications.   SPECIAL INSTRUCTIONS:  His Foley catheter was left indwelling and he was  instructed to follow up in 1 week for evaluation.   DISPOSITION:  Home.   CONDITION ON DISCHARGE:  Prognosis is good.  Condition is improved.   FOLLOW UP:  He was encouraged to follow up as an outpatient with Dr. Drue Novel  as well.      Excell Seltzer. Michael Johns, M.D.  Electronically Signed     JJW/MEDQ  D:  07/30/2006  T:  07/31/2006  Job:  [id number]   cc:   Willow Ora, MD  [phone] [address] [place], [place] [address]

## 2010-06-11 NOTE — Op Note (Signed)
Michael Johns, Michael Johns                 ACCOUNT NO.:  [id number]   MEDICAL RECORD NO.:  [id number]          PATIENT TYPE:  AMB   LOCATION:  DAY                          FACILITY:  [facility]   PHYSICIAN:  Adolph Pollack, M.D.DATE OF BIRTH:  1932-05-04   DATE OF PROCEDURE:  12/17/2007  DATE OF DISCHARGE:                               OPERATIVE REPORT   PREOPERATIVE DIAGNOSIS:  Right inguinal hernia.   POSTOPERATIVE DIAGNOSIS:  Indirect right inguinal hernia.   PROCEDURE:  Right inguinal repair with mesh.   SURGEON:  Adolph Pollack, M.D.   ANESTHESIA:  Gentle/LMA with Marcaine local.   INDICATION:  Mr. Kracht is a 77 year old male who developed some  testicular pain.  He was noted to have a moderate size right inguinal  hernia. He would like to have it repaired and thus presents for repair.  We discussed the procedure, risks and aftercare preoperatively.   TECHNIQUE:  He was seen in the holding area and the right groin marked  with my initials.  He was then brought to the operating room, placed  supine on the operating table, and general anesthetic by way of LMA was  administered.  The hair in the right groin was clipped and the area  sterilely prepped and draped.  Marcaine solution was infiltrated  superficially and deep in the right groin.  Right groin incision was  made through the skin and subcutaneous tissue down to level of external  oblique aponeurosis.  Local anesthetic was infiltrated deep to the  external oblique aponeurosis.  The external oblique aponeurosis was then  divided in the direction of its fibers through the external ring  medially and up toward the anterior superior iliac spine laterally.  Using blunt dissection, the shelving edge of the inguinal ligament was  exposed inferiorly, and internal oblique muscle and aponeurosis were  exposed superiorly.  The ilioinguinal nerve was identified and kept with  the spermatic cord.  Spermatic cord was isolated, and a  posterior window  created around it.  Indirect sac was identified and dissected free from  the cord and reduced back through a patulous internal ring.   Next, a piece of 3 x 6 polypropylene mesh was brought into the field and  anchored 2 cm medial to the pubic tubercle with 2-0 Prolene suture.  The  inferior aspect of mesh was then anchored to the shelving edge of the  inguinal ligament with a running 2-0 Prolene suture up to the level 1-2  cm lateral to the internal ring.  A slit was cut in the mesh and 2 tails  wrapped around the cord.  The superior aspect of mesh was anchored to  the internal oblique aponeurosis with interrupted 2-0 Vicryl sutures.  The 2 tails were crossed creating a new internal ring, and they were  anchored to the shelving edge of the inguinal ligament with a single 2-0  Prolene suture.  The tip of the hemostat was able to be placed in the  new aperture.   Hemostasis was adequate at this time.  The lateral aspect of mesh was  then tucked deep to the external oblique aponeurosis.  The external  oblique aponeurosis was closed over the mesh with running 3-0 Vicryl  suture.  Scarpa fascia was closed with running 3-0 Vicryl suture.  Skin  was closed with 4-0 Monocryl subcuticular stitch followed by Steri-  Strips and sterile dressings.  The right testicle was in its normal  position in the scrotum.   He tolerated the procedure well without any apparent complications and  was taken to the recovery in satisfactory condition.      Adolph Pollack, M.D.  Electronically Signed     TJR/MEDQ  D:  12/17/2007  T:  12/17/2007  Job:  [id number]

## 2010-06-11 NOTE — H&P (Signed)
NAMESABIN, GIBEAULT NO.:  [id number]   MEDICAL RECORD NO.:  [id number]          PATIENT TYPE:  EMS   LOCATION:  MAJO                         FACILITY:  [facility]   PHYSICIAN:  Ramiro Harvest, MD    DATE OF BIRTH:  Apr 05, 1932   DATE OF ADMISSION:  12/03/2007  DATE OF DISCHARGE:                              HISTORY & PHYSICAL   PRIMARY CARE PHYSICIAN:  Dr. Drue Novel of [facility].   HISTORY OF PRESENT ILLNESS:  Michael Johns is a 77 year old white male  with a history of prostate cancer status post prostatectomy, history of  hypertension who presents to the ED with a several hour history of  epigastric and upper abdominal pain constant in nature and occurring at  rest.  The patient denied any nausea, no fever, no chills, no  diaphoresis, no headache, no visual changes, no melena, no hematemesis,  no hematochezia, no palpitations, no focal neurological symptoms.  The  patient does endorse some shortness of breath with abdominal pain and  some emesis while taking the p.o. contrast in the ED.  Otherwise, the  patient has been stable.  In the ED the patient was given GI cocktail,  Dilaudid, Zofran with some improvement in his symptoms.  Lipase levels  obtained were 382.  CBC was unremarkable.  CMET with sodium of 130,  chloride of 93; otherwise, was within normal limits.  Acute abdominal  series is a large fecal burden and nonobstructive bowel gas pattern.  Abdominal ultrasound was negative for cholecystitis and a 9 to 8 cm  cystic lesion within the spleen hilum.  CT of the abdomen and pelvis was  negative for splenic mass, and large tubal was noted within the rectum.   EKG was negative.  We are called to admit the patient for further  evaluation and management.   ALLERGIES:  CEPHALEXIN.   PAST MEDICAL HISTORY:  1. Hypertension.  2. BPH.  3. Adenocarcinoma of the prostate status post radical prostatectomy      per Dr. Annabell Howells in May of 2008.  4. Nasal polyp  status post removal in 1993.  5. A middle ear surgery in 1962.  6. Appendectomy at age 35.  7. Tonsillectomy at age 36.   HOME MEDICATIONS:  1. HCTZ 25 mg p.o. daily.  2. Metoprolol 25 mg p.o. daily.  3. Aspirin 81 mg p.o. daily.  4. Multivitamin 1 tab p.o. daily.  5. KCl 99 mg p.o. daily.  6. Fish oil 1 g p.o. daily.  7. Stool softener b.i.d.   SOCIAL HISTORY:  The patient is married, lives in [place].  No tobacco  use .  Occasional/ social alcohol use.  No IV drug use.   FAMILY HISTORY:  Noncontributory.   REVIEW OF SYSTEMS:  As per HPI; otherwise, negative.   PHYSICAL EXAMINATION:  Temperature 97.7, blood pressure 142/75, pulse of  65, respiratory rate 20, satting 98% on room air.  GENERAL:  Patient in no apparent distress, sleeping on gurney.  HEENT:  Normocephalic, atraumatic.  Pupils equal, round, and reactive to  light and accommodation.  Extraocular movements  intact.  Oropharynx is  clear.  No lesions, no exudates.  NECK:  Supple.  Dry mucous membranes.  RESPIRATORY:  Lungs are clear to auscultation bilaterally.  ABDOMEN:  Soft, nontender, nondistended.  Positive bowel sounds.  EXTREMITIES:  No clubbing or cyanosis.  NEUROLOGICAL:  The patient is alert and oriented x3.  Cranial nerves II-  XII are grossly intact.  No focal deficits.   LABORATORIES:  Lipase 382.  Sodium 130, potassium 3.6, chloride 93,  bicarb 27, BUN 13, creatinine 0.94, glucose 126.  Bilirubin 0.6, alk  phosphatase 54.  AST 32, ALT 22.  Calcium 9.6.  Protein 7.1.  Albumin  4.0.  CBC:  White count 8.1, hemoglobin 13.3, platelets 243, hematocrit  40.7, ANC of 4.4.  Direct LDL 136.5.   Acute abdominal series shows large fecal burden, nonobstructive bowel  gas pattern, left basal atelectasis.  Abdominal ultrasound shows no  cholecystitis or cholelithiasis, 9.8 cm long axis cystic lesion present  within the splenic hilum.  Differential includes pseudocyst or a  peristaltic stomach.  CT of the abdomen  and pelvis showed no mass lesion  in the splenic hilum.  This could represent a distended stomach with  hyperperistalsis at the time of ultrasound.  Cause for gastric  distention is unclear, no acute pelvic abnormality.  Large stool ball  within the rectum.  EKG:  Normal sinus rhythm.   ASSESSMENT AND PLAN:  Michael Johns is a 77 year old gentleman with a  history of adenocarcinoma of the prostate status post resection, history  of hypertension, and benign prostatic hypertrophy who presents to the  emergency department with poor abdominal pain.   1. Abdominal pain likely secondary to constipation.  The patient with      an elevated lipase.  However, computed tomography of the abdomen      and pelvis is negative for an acute pancreatitis.  Will recheck      lipase and amylase levels, cycle cardiac enzymes q.12 hours x2.      Check an EKG.  Hydrate with intravenous fluids.  Place on MiraLax      and follow.  2. Chemical pancreatitis.  Will place on intravenous fluids,      supportive care.  Computed tomography of the abdomen and pelvis      negative for any acute pancreatitis.  Will check a fasting lipid      panel and follow.  3. Constipation.  Will place on MiraLax.  4. Hyponatremia likely secondary to hypovolemia.  Check a urine      sodium.  Check a TSH.  Place on intravenous fluids and follow.  5. Benign prostatic hypertrophy.  6. Adenocarcinoma of the prostate status post prostatectomy.  7. Prophylaxis Protonix for gastrointestinal prophylaxis.  Sequential      compression devices for deep venous thrombosis prophylaxis.   It has been a pleasure taking care of Michael Johns.      Ramiro Harvest, MD  Electronically Signed     DT/MEDQ  D:  12/03/2007  T:  12/03/2007  Job:  [id number]   cc:   Willow Ora, MD

## 2010-06-14 NOTE — Op Note (Signed)
[name], [name]                 ACCOUNT NO.:  [id number]   MEDICAL RECORD NO.:  [id number]          PATIENT TYPE:  INP   LOCATION:  1425                         FACILITY:  [facility]   PHYSICIAN:  Michael Johns, M.D.    DATE OF BIRTH:  01/28/1932   DATE OF PROCEDURE:  06/04/2006  DATE OF DISCHARGE:                               OPERATIVE REPORT   PREOPERATIVE DIAGNOSIS:  Prostate cancer.   POSTOPERATIVE DIAGNOSIS:  Prostate cancer.   OPERATION PERFORMED:  Robot assisted laparoscopic radical prostatectomy  with bilateral pelvic lymphadenectomy.   SURGEON:  Michael Johns, M.D.   ASSISTANT:  Crecencio Mc, M.D.   ANESTHESIA:  General.   DRAINS:  Foley catheter and Blake drain.   BLOOD LOSS:  200 mL.   COMPLICATIONS:  None.   INDICATIONS FOR PROCEDURE:  Michael Johns is a 77 year old white male with a  Gleason 7 adenocarcinoma of the prostate who has elected to undergo  radical prostatectomy.   FINDINGS AND PROCEDURE:  The patient was given Ancef.  He was fitted  with PAS hose.  He was taken to operating room where general anesthetic  was induced.  He was placed in lithotomy position.  His abdomen was  shaved.  He was prepped with Betadine solution.  He was then placed in  steep Trendelenburg position and was draped in the usual sterile  fashion.  A 22 French Foley catheter was inserted.  The balloon was  filled with 30 mL of sterile fluid and the bladder was drained.   A periumbilical incision was made 18 cm above the pubis.  This was  carried down through the fascia with a Bovie.  The peritoneum was nicked  with the scalpel and a hemostat was passed through the posterior sheath  into the peritoneal cavity.  A finger was placed to confirm appropriate  placement and a 12 mm laparoscopic port was inserted.  This was secured  using a buttressing 2-0 Vicryl figure-of-eight suture.  Once in  position, the laparoscopic camera was inserted.  Examination revealed  significant adhesions  in the right lower quadrant from the patient's  prior appendectomy.   The robot port sites were marked and injected with local anesthetic.  The 5 mm port was placed in its usual location as were the two left-  sided 8 mm robot arm ports.  Using the 5 mm port and one of the robot  arm ports, a laparoscopic adhesiolysis was performed to release the  adhesions from the right lower quadrant.  The remaining right lower  quadrant 8 mm robot port and 12 mm working port were placed in the right  abdomen after lysis of adhesions.   At this point, the robot was brought onto the field and docked.  A  monopolar scissors was placed in the right robot port, and a PK  dissector was placed in the medial left port and a ProGrasp was placed  in the lateral robotic port.  The bladder was then filled with sterile  fluid and the peritoneum was incised over the anterior abdominal wall.  The obliterated  umbilical ligaments were divided using sharp and cautery  dissection and the bladder was reflected off the anterior abdominal wall  exposing the pubic symphysis and anterior prostate.  The bladder was  then drained.  The endopelvic fascia was opened on the right side and  the lateral prostatic attachments were freed out to the apex.  The left  endopelvic fascia was then incised and the lateral prostatic attachments  were taken down using sharp and blunt dissection from the base to the  apex.  The puboprostatic ligaments were divided and the groove between  the dorsal vein complex and the urethra was identified on each side.  An  endoscopic GIA stapler was then passed and the dorsal vein complex was  engaged and stapled.   At this point we turned our attention to the bladder neck which was  opened anteriorly using the monopolar cautery.  Initially, we were a bit  distal into the prostate so some residual prostatic tissue was removed  from the bladder neck side.  Once the anterior dissection had been   performed and the Foley catheter was exposed, it was brought onto the  field and used to provide anterior traction.  The posterior bladder neck  was then divided and the bladder was dropped away from the prostate  exposing the ampulla of the vas.  Indigo carmine was given to allow  better visualization of urine in the field and confirm ureteral patency.  Once the bladder neck had been freed anterior and posteriorly was when  the residual prostatic tissue was removed from the bladder neck.  It was  sent as a margin specimen.   The left vasal ampulla was then grasped and dissected out and then  divided and used to provide anterior traction while the left seminal  vesicle was dissected out using monopolar cautery.  The fourth robot arm  was then used to provide anterior traction on the left structures while  the right vasal ampulla was dissected out and divided followed by the  left seminal vesicle.  Once both  groups of structures were dissected  out, the Prestige grasper was used through the 12 mm right port and the  PK dissector were then used to provide anterior traction on the  prostate.  The monopolar scissors and ProGrasp instrument were then used  to develop the plane between the rectum and the posterior prostate.  There were some adhesions on the right that were taken down sharply.  Once this plane had been developed, we turned our attention to the nerve  spare.   The prostatic fascia was incised anteriorly on the right and the  neurovascular bundle was developed and dissected off the prostate back  to the level of the pedicle and out to the apex.  This was then repeated  on the left side.  Good bundles were developed. We then turned our  attention to the pedicle dissection.  The left prostatic pedicle was  divided into packets using the scissors and clips and the prostate was  reflected anteriorly as we marched up the pedicle.  Once the left pedicle had been taken down leaving  only some minor apical attachments,  the right pedicle was taken down in identical fashion.  Care was taken  to avoid injury to the neurovascular bundles.  Once again apical  attachments remained after division of the pedicle.   We then completed the division of the dorsal vein complex using the  monopolar scissors and some residual apical lateral  attachments were  taken down sharply.  Once small area of bleeding on the left was  controlled with bipolar cautery with care taken to stay away from the  neurovascular bundle.  The anterior urethra was then opened with cold  scissors and the Foley catheter was then backed into the urethra.  The  posterior urethra was then divided along with a couple of minor apical  posterior attachments and the prostate was then moved out of the field.  No significant bleeding was noted.  Irrigation was placed in the  prostatic fossa and the rectal bulb was pumped, no evidence of air  bubbles were noted.  The irrigant was then aspirated.   We then turned our attention to the pelvic lymph node dissection.  The  right iliac vessel was identified and the plane between the vessel and  the lymphatic packet was developed using the PK dissector and monopolar  scissors.  Once the pelvic sidewall was identified, the obturator nerve  was identified on the medial aspect of the bundle and was dissected  free.  The distal end of the pedicle was controlled with two large  Hemoclips.  The pedicle was then further developed back to the  bifurcation and then divided with large clips and removed.  This was  taken off the field and sent for permanent section.   The left iliac vein was identified and the plane between the vein and  the lymphatic package was developed out to the pelvic side wall.  The  obturator nerve was identified medially and dissected free from the  packet.  Once fully encircled, the distal lymphatic channels were  divided between large clips and the packet  was then developed back to  the bifurcation and divided between large clips and removed from the  field.  No obvious nodal disease was noted.  Both of the dissection  sites were inspected and no active bleeding was noted.   We then turned our attention to the urethrovesical anastomosis.  A 3-0  Vicryl on an SH needle was used to place a single stitch at the 12  o'clock position on the bladder neck and urethral stump.  Then using a  slip knot, the bladder neck was brought down to the urethral stump and  secured.   Once this initial tacking stitch was placed, a 4-0 Monocryl double armed  suture was then used to perform a running urethrovesical anastomosis in  the usual fashion.  This was done without complications and when tied,  irrigation demonstrated a watertight anastomosis.  The ends of the  suture were trimmed and the needles were removed as per routine.  At  this point the 20-French Coude Foley catheter was inserted without difficulty.  The bladder irrigation was performed as noted and no  extravasation was apparent.   Final inspection revealed no active bleeding.  No evidence of urinary  extravasation was noted.  The robot as undocked and removed from the  field.  The camera was moved to the 12 mm working port and a lap  entrapment sac was passed through the periumbilical port.  The prostate  was then placed within the entrapment sac which was deployed and the  working port was then reinserted alongside the entrapment pack string.  The 12 mm working port was then removed and the port site was closed  using 2-0 Vicryl and a suture passer.  Prior to placement of the  entrapment sac, a #10 round Blake drain had been placed through  the  fourth arm working port and positioned in the pelvis.  The working port  was removed and the drain was secured with 3-0 nylon suture.   After closure of the 12 mm working port, the remaining two 8 mm robot  ports and the 5 mm port removed under  direct inspection, no bleeding was  noted.  The periumbilical port was then removed and the figure-of-eight  suture was released.  The entrapment sac was brought up to the incision  and using an army-navy retractor and finger for guidance, the fascia was  opened sufficiently to allow removal of the specimen.   Once the specimen was removed, the anterior rectus fascia was closed  using a running 0-0 Vicryl.  The port sites were infiltrated with 0.25%  Marcaine and all were closed with skin clips.  The Blake drain was  placed to bulb suction.  The Foley catheter was placed to straight  drainage and secured to the patient's thigh after removal of the drapes.  The patient was taken out of Trendelenburg position.  The drapes were  removed. The Foley catheter was secured as noted.  The rectal catheter  was removed.  He was taken down from lithotomy position, his anesthetic  was reversed.  He was removed to recovery room in stable condition.  There were no complications.      Michael Johns, M.D.  Electronically Signed     JJW/MEDQ  D:  06/04/2006  T:  06/05/2006  Job:  [id number]

## 2010-06-14 NOTE — Op Note (Signed)
Michael Johns, Michael Johns                 ACCOUNT NO.:  [id number]   MEDICAL RECORD NO.:  [id number]          PATIENT TYPE:  AMB   LOCATION:  NESC                         FACILITY:  [facility]   PHYSICIAN:  Excell Seltzer. Annabell Howells, M.D.    DATE OF BIRTH:  Mar 04, 1932   DATE OF PROCEDURE:  09/02/2005  DATE OF DISCHARGE:                                 OPERATIVE REPORT   PROCEDURE:  Transrectal ultrasound of the prostate, with saturation prostate  biopsy.   PREOPERATIVE DIAGNOSIS:  Elevated prostate specific antigen with atypia,  suspicious for adenocarcinoma on prior biopsy.   POSTOPERATIVE DIAGNOSIS:  Elevated prostate specific antigen with atypia,  suspicious for adenocarcinoma on prior biopsy.   SURGEON:  Excell Seltzer. Annabell Howells, M.D.   ANESTHESIA:  General.   SPECIMEN:  Prostate biopsies from the right and left base, the right and  left mid to base, right and left mid apex, and right left apex, three cores  from each site.   COMPLICATIONS:  None.   INDICATIONS:  [name] is a 77 year old white male with a rising PSA who  was found to have atypia suspicious for prostate cancer on a prior 12-core  biopsy. Several sites were positive, but a definitive diagnosis of cancer  could not be main.  It was felt that a more extensive biopsy was indicated.   FINDINGS AND PROCEDURE:  The patient is given p.o. Cipro preoperatively and  Rocephin prior the procedure, and was taken operating room where general  anesthetic was induced.  He was placed in a lateral decubitus position, with  the left side down. The transrectal ultrasound probe was assembled and  inserted, and scanning was performed.  Examination revealed normal-appearing  seminal vesicles.  The prostate was symmetric. There was slight  hypoechogenicity of the left base of the prostate that extended into the  midportion. There were a few scattered calcifications at the junction of the  peripheral and transitional zone, and some enlargement of the  transitional  zone which had a normal variegated echotexture. The sagittal views revealed  no additional abnormalities.  There was a slightly hypoechoic appearance to  the left peripheral zone on the lateral sagittal view. Prostate-seminal  vesicle angle was unremarkable bilaterally. Prostate volume was 36 cc.   After completion of the diagnostic scan, the biopsy was performed, as noted  in the specimen comment.  He had biopsies obtained from four planes on each  side, with three cores take in each plane. The ultrasound probe was removed  after completion of all the biopsies.  He was rolled back supine. His  anesthetic was reversed.  He was taken to the recovery room in stable  condition.  There were no complications.      Excell Seltzer. Annabell Howells, M.D.  Electronically Signed     JJW/MEDQ  D:  09/02/2005  T:  09/02/2005  Job:  [id number]   cc:   Juline Patch, M.D.  Fax: [phone]

## 2010-07-06 ENCOUNTER — Emergency Department (HOSPITAL_COMMUNITY): Payer: Medicare Other

## 2010-07-06 ENCOUNTER — Inpatient Hospital Stay (HOSPITAL_COMMUNITY)
Admission: EM | Admit: 2010-07-06 | Discharge: 2010-07-07 | DRG: 312 | Disposition: A | Discharge status: Home / Self Care | Payer: Medicare Other | Attending: Internal Medicine | Admitting: Internal Medicine

## 2010-07-06 DIAGNOSIS — I10 Essential (primary) hypertension: Secondary | ICD-10-CM | POA: Diagnosis present

## 2010-07-06 DIAGNOSIS — R55 Syncope and collapse: Principal | ICD-10-CM | POA: Diagnosis present

## 2010-07-06 DIAGNOSIS — K5641 Fecal impaction: Secondary | ICD-10-CM | POA: Diagnosis present

## 2010-07-06 DIAGNOSIS — E871 Hypo-osmolality and hyponatremia: Secondary | ICD-10-CM | POA: Diagnosis present

## 2010-07-06 DIAGNOSIS — Z8546 Personal history of malignant neoplasm of prostate: Secondary | ICD-10-CM

## 2010-07-06 LAB — CBC
HCT: 37.6 % — ABNORMAL LOW (ref 39.0–52.0)
MCH: 31 pg (ref 26.0–34.0)
MCHC: 37.5 g/dL — ABNORMAL HIGH (ref 30.0–36.0)
RDW: 13.4 % (ref 11.5–15.5)

## 2010-07-06 LAB — URINALYSIS, ROUTINE W REFLEX MICROSCOPIC: pH: 7.5 (ref 5.0–8.0)

## 2010-07-06 LAB — POCT I-STAT, CHEM 8
BUN: 21 mg/dL (ref 6–23)
Calcium, Ion: 1 mmol/L — ABNORMAL LOW (ref 1.12–1.32)
Chloride: 92 mEq/L — ABNORMAL LOW (ref 96–112)
Glucose, Bld: 123 mg/dL — ABNORMAL HIGH (ref 70–99)
HCT: 43 % (ref 39.0–52.0)
Potassium: 4.2 mEq/L (ref 3.5–5.1)
Sodium: 129 mEq/L — ABNORMAL LOW (ref 135–145)
TCO2: 29 mmol/L (ref 0–100)

## 2010-07-06 NOTE — H&P (Signed)
Michael Johns, Michael Johns                 ACCOUNT NO.:  [id number]  MEDICAL RECORD NO.:  [id number]  LOCATION:  WLED                         FACILITY:  [facility]  PHYSICIAN:  Jonny Ruiz, MD    DATE OF BIRTH:  [birth year]  DATE OF ADMISSION:  07/06/2010 DATE OF DISCHARGE:                             HISTORY & PHYSICAL   CHIEF COMPLAINT:  Near syncope.  HISTORY OF PRESENT ILLNESS:  The patient is a 77 year old man presenting with an episode of near syncope this morning.  The patient states that he has been having severe constipation for the last several days and in fact has not had a bowel movement in a few days and this morning after taking a bottle of magnesium citrate, he went to bathroom and had to strain without any bowel movement.  At some point, he became very lightheaded, cold, clammy, and felt like he was going to pass out.  He did not have any chest pain, shortness of breath, or palpitations.  His wife contacted EMS and he was brought to the emergency department.  In the emergency department, he was still feeling lightheaded and somewhat wobbly when he walked.  He was complaining of pressure in his lower abdomen and unable to have a bowel movement.  ED physician, Dr. Hyacinth Meeker, examined the patient and found a fecal impaction.  He attempted several times to manually relief the fecal impaction without any success, subsequently I was contacted for further evaluation and admission to the hospital.  PAST MEDICAL HISTORY: 1. Prostate cancer, robotic prostatectomy. 2. Bilateral inguinal hernia repair. 3. Appendectomy. 4. Hypertension.  CURRENT MEDICATIONS: 1. Hydrochlorothiazide 25 mg a day. 2. Toprol-XL 25 mg a day. 3. Aspirin 81 mg a day. 4. MiraLax 17 g p.r.n. 5. Multivitamins 1 tablet a day. 6. Potassium tablets 99 mg a day.  ALLERGIES:  CEPHALEXIN causes rash and itching.  FAMILY HISTORY:  Negative for colon cancer.  SOCIAL HISTORY:  He is retired from [employer].  He is married.  No children of his own, but he has 3 stepchildren with his wife.  Denies tobacco, alcohol, or recreational drugs.  PREVENTIVE MEDICINE:  The patient has had 2 colonoscopies in the past with Dr. Arlyce Dice.  On the first one, he was found with benign polyps, which were removed.  Second one 4 years ago was normal.  REVIEW OF SYSTEMS:  CONSTITUTIONAL:  The patient denies recent illness, fever, chills, or night sweats.  No weight loss, fatigue, or malaise. CARDIOVASCULAR:  Denies chest pain, shortness of breath, palpitations, or prior history of syncope. RESPIRATORY:  Denies troubled breathing, cough, or wheezes. GI:  Denies nausea or vomiting, otherwise see HPI. GU:  Denies dysuria, frequency, or hematuria.  The patient states that he be on himself while he was receiving rectal manipulation to relief his fecal impaction. MUSCULOSKELETAL:  No arthralgia or myalgia.  PHYSICAL EXAMINATION:  VITAL SIGNS:  Temperature 98.4, respirations 18, heart rate 57 and regular, blood pressure 115/64, and O2 saturation 99%. GENERAL APPEARANCE:  The patient is a healthy-looking Caucasian man, who appears in no distress.  He is pleasant and cooperative. HEENT:  Unremarkable. NECK:  Supple without JVD or  old lymphadenopathy. HEART:  Regular S1 and S2 without gallops, murmurs, or rubs. LUNGS:  Clear to auscultation. ABDOMEN:  Slightly distended with increased bowel sounds, soft, and tender in the hypogastrium.  No guarding or rebound tenderness. RECTAL:  Deferred since it was performed by ED physician. EXTREMITIES:  Without clubbing, cyanosis or edema. NEUROLOGIC:  Nonfocal.  LABORATORY DATA:  Sodium 129, potassium 4.2, chloride 92, CO2 of 29, BUN 21, and creatinine 1.2.  Hemoglobin 14.6, hematocrit 43.0, and WBC pending.  X-ray is pending.  IMPRESSION: 1. Intestinal obstruction from fecal impaction. 2. Near syncope as a result of vasovagal reaction from intestinal      obstruction from fecal impaction. 3. Mild hyponatremia, most likely from diuretic use. 4. History of hypertension.  PLAN: 1. Admit to the hospital  2. GoLYTELY p.o. b.i.d. 3. Tap water enema and repeat manual disimpaction. 4. Analgesia as needed.          ______________________________ Jonny Ruiz, MD     GL/MEDQ  D:  07/06/2010  T:  07/06/2010  Job:  [id number]  cc:   Willow Ora, MD [phone] [place], [place] [address]  Barbette Hair. Arlyce Dice, MD,FACG [address] [place] [address]  Electronically Signed by Jonny Ruiz MD on 07/06/2010 07:55:55 PM

## 2010-07-17 ENCOUNTER — Encounter: Payer: Self-pay | Admitting: Internal Medicine

## 2010-07-18 NOTE — Discharge Summary (Signed)
Michael Johns, Michael Johns                 ACCOUNT NO.:  [id number]  MEDICAL RECORD NO.:  [id number]  LOCATION:  1534                         FACILITY:  [facility]  PHYSICIAN:  Marinda Elk, M.D.DATE OF BIRTH:  02-11-1932  DATE OF ADMISSION:  07/06/2010 DATE OF DISCHARGE:  07/07/2010                              DISCHARGE SUMMARY   PRIMARY CARE DOCTOR:  Dr. Johnnette Litter.  DISCHARGE DIAGNOSES: 1. Vasovagal syncope. 2. Fecal impaction.  DISCHARGE MEDICATIONS: 1. Aspirin 81 mg daily. 2. Hydrochlorothiazide 25 mg daily. 3. Mag oxide 4 ounces daily. 4. MiraLax 17 g daily. 5. Multivitamin 1 tab daily. 6. Potassium 1 tab daily. 7. Toprol 50 mg daily.  PROCEDURES PERFORMED:  Abdominal x-ray showed no acute abnormalities.  BRIEF ADMITTING HISTORY AND PHYSICAL:  This 77 year old man presented with episode of near syncope this morning.  The patient stated that he had been having severe constipation for the last several days and in fact, he had no bowel movements in the last 5 days.  After taking bottle of magnesium citrate, he went to the bathroom and had to strain very hard without any movement.  At some point, he became very lightheaded, cold, clammy and felt like he was going to pass out, but he did not.  He denies chest pain, shortness of breath, palpitation or shortness of breath.  His wife called the EMS and he was brought here to the ED and we were asked to admit and further evaluate.  PHYSICAL EXAMINATION:  VITAL SIGNS:  Temperature 98, respiration is 18, heart rate of 57, blood pressure 115/64.  He was satting 99% on 2 L. GENERAL:  He is awake, alert and oriented x3, in no acute distress. HEENT:  Unremarkable. NECK:  Supple.  No JVD. CARDIOVASCULAR:  He has regular rate and rhythm with a positive S1 and S2.  No murmurs, rubs or gallops. ABDOMEN:  Positive bowel sounds, nontender and nondistended. EXTREMITIES:  Positive pulses, clubbing, cyanosis or edema. LUNGS:  Good air movement.   Clear to auscultation. SKIN:  No rashes or ulceration. NEURO:  Nonfocal. RECTAL:  Deferred was performed by the ED physician.  LABORATORY DATA ON ADMISSION:  Sodium 121, potassium 4.2, chloride 99, bicarb 29, BUN of 21, creatinine 1.2, hemoglobin of 14.  White count of 11.4, platelet count of 257.  His UA was clean.  EKG shows sinus bradycardia with no ST-segment changes.  ASSESSMENT AND PLAN: 1. Vasovagal syncope.  He was admitted to the hospital, was monitored     on telemetry over 24 hours with no events.  EKG showed no changes,     so this was probably vasovagal syncope from straining. 2. Fecal impaction.  He was given GoLYTELY enema and Fleet enema.  It     has resolved.  He had 7 bowel movements while in the hospital.  DISPOSITION:  The patient will follow up with his primary care doctor. Here we will check his blood pressure and titrate his blood pressure medications as needed.  We will also see if he is having regular bowel movements.  We have discussed with him the plan to have regular schedule and treatment.  He is on MiraLax.  He is hesitant to take MiraLax regularly as he thinks he might become dependent.  Vitals on the day of discharge temperature 98, pulse 68, respirations 20, blood pressure 141/71.  He was satting 99% on room air.  Labs on day of discharge shows none.     Marinda Elk, M.D.     AF/MEDQ  D:  07/07/2010  T:  07/08/2010  Job:  [id number]  Electronically Signed by Marinda Elk M.D. on 07/18/2010 06:30:02 PM

## 2010-07-22 ENCOUNTER — Encounter: Payer: Self-pay | Admitting: Internal Medicine

## 2010-07-22 ENCOUNTER — Ambulatory Visit (INDEPENDENT_AMBULATORY_CARE_PROVIDER_SITE_OTHER): Payer: Medicare Other | Admitting: Internal Medicine

## 2010-07-22 DIAGNOSIS — K5909 Other constipation: Secondary | ICD-10-CM | POA: Insufficient documentation

## 2010-07-22 DIAGNOSIS — K5900 Constipation, unspecified: Secondary | ICD-10-CM

## 2010-07-22 NOTE — Assessment & Plan Note (Addendum)
Chronic mild constipation for a while. Had a fecal impactation and a  syncope few  days ago. Since he left the hospital, he is doing great, I agree with the use of MiraLax if he has no bowel movement in a 2 day period.

## 2010-07-22 NOTE — Progress Notes (Signed)
  Subjective:    Patient ID: Michael Johns, male    DOB: 1932-06-20, 77 y.o.   MRN: [id number]  HPI Hospital followup, chart reviewed. Patient was admitted 07-06-10 for one day and diagnosed with vaso vagal and a fecal impactation  X-ray of the abdomen was negative, wbc show white count of 11.4, normal platelets.  UA was negative, potassium normal, creatinine 1.2  Past Medical History  Diagnosis Date  . HYPERTENSION 05/19/2006  . ADENOCARCINOMA, PROSTATE 07/01/2006  . HYPERLIPIDEMIA, MILD 07/18/2009  . PROSTATECTOMY, HX OF 07/01/2006  . Gout     see 09-2008 note  . 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 Abdominal pain 11/09    due to constipation and ?pancreatitis- admitted   Past Surgical History  Procedure Date  . Appendectomy   . Tonsillectomy   . Prostatectomy 05/2007    LADS (-) Bone scan (-)  . Inguinal hernia repair 11/2007    right  . Rt middle ear surgery       Review of Systems Since he left the hospital, he is doing better, he is drinking more fluids, has changed his diet and is eating more fruits and vegetables. No fever, nausea, vomiting, diarrhea or blood in the stools. His bowel movements are no less often do every 2 days and he has MiraLax in case he goes more than 2 days without a BM which I think is a good idea    Objective:   Physical Exam  Constitutional: He appears well-nourished. No distress.       Not pale  Cardiovascular: Normal rate, regular rhythm and normal heart sounds.   No murmur heard. Pulmonary/Chest: Breath sounds normal. No respiratory distress. He has no wheezes.  Abdominal: Soft. Bowel sounds are normal. He exhibits no distension. There is no tenderness. There is no rebound.  Musculoskeletal: He exhibits no edema.  Skin: He is not diaphoretic.          Assessment & Plan:

## 2010-07-22 NOTE — Patient Instructions (Addendum)
miralax daily as needed if no BM in 2 days Call if prolonged or severe constipation

## 2010-08-26 ENCOUNTER — Ambulatory Visit: Payer: Self-pay | Admitting: Internal Medicine

## 2010-09-05 ENCOUNTER — Ambulatory Visit (INDEPENDENT_AMBULATORY_CARE_PROVIDER_SITE_OTHER): Payer: Medicare Other | Admitting: Internal Medicine

## 2010-09-05 ENCOUNTER — Encounter: Payer: Self-pay | Admitting: Internal Medicine

## 2010-09-05 VITALS — BP 120/80 | HR 60 | Wt 148.8 lb

## 2010-09-05 DIAGNOSIS — K5900 Constipation, unspecified: Secondary | ICD-10-CM

## 2010-09-05 DIAGNOSIS — I10 Essential (primary) hypertension: Secondary | ICD-10-CM

## 2010-09-05 LAB — BASIC METABOLIC PANEL
Calcium: 9.3 mg/dL (ref 8.4–10.5)
Creatinine, Ser: 1.1 mg/dL (ref 0.4–1.5)
GFR: 66.31 mL/min (ref >60.00)
Glucose, Bld: 93 mg/dL (ref 70–99)
Sodium: 131 mEq/L — ABNORMAL LOW (ref 135–145)

## 2010-09-05 NOTE — Assessment & Plan Note (Signed)
Currently well controlled with as needed Senokot and MiraLax.  See hypertension comment

## 2010-09-05 NOTE — Assessment & Plan Note (Addendum)
Patient would like to discontinue the diuretic, he thinks it may be making worse his constipation.  BP is actually under good control at this time;  I agree to discontinue hydrochlorothiazide. Will also discontinue the potassium supplements. On chart review, he had hyponatremia on and off, likely due to  hydrochlorothiazide. If his blood pressure goes up,  we will start another medication probably an ARB Check a BMP

## 2010-09-05 NOTE — Progress Notes (Signed)
  Subjective:    Patient ID: Michael Johns, male    DOB: 1932/09/07, 77 y.o.   MRN: [id number]  HPI Routine office visit, feeling great  Past Medical History  Diagnosis Date  . HYPERTENSION 05/19/2006  . ADENOCARCINOMA, PROSTATE 07/01/2006  . HYPERLIPIDEMIA, MILD 07/18/2009  . PROSTATECTOMY, HX OF 07/01/2006  . Gout     see 09-2008 note  . 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 Abdominal pain 11/09    due to constipation and ?pancreatitis- admitted   Past Surgical History  Procedure Date  . Appendectomy   . Tonsillectomy   . Prostatectomy 05/2007    LADS (-) Bone scan (-)  . Inguinal hernia repair 11/2007    right  . Rt middle ear surgery      Review of Systems Good  compliance with all medicines, ambulatory blood pressures within normal. Constipation well control on MiraLax and Senokot that he takes as needed. Denies any chest pain, shortness of breath or edema. Wonders if constipation is triggered or make worse by taking a diuretic    Objective:   Physical Exam  Constitutional: He is oriented to person, place, and time. He appears well-developed and well-nourished.  Cardiovascular: Normal rate, regular rhythm and normal heart sounds.   No murmur heard. Pulmonary/Chest: Effort normal and breath sounds normal. No respiratory distress. He has no wheezes. He has no rales.  Musculoskeletal: He exhibits no edema.  Neurological: He is alert and oriented to person, place, and time.  Psychiatric: He has a normal mood and affect. His behavior is normal. Judgment and thought content normal.          Assessment & Plan:

## 2010-09-05 NOTE — Patient Instructions (Addendum)
stop hydrochlorothiazide and potassium If the BP increase to > 140/85 consistently, let me know

## 2010-09-09 ENCOUNTER — Telehealth: Payer: Self-pay | Admitting: *Deleted

## 2010-09-09 NOTE — Telephone Encounter (Signed)
Pt Informed. Patient states that since stopping HCTZ his BP has been sporadic from 113/73 to 164/90; mainly systolic being >140. Pt will keep records for a couple more days and inform us as to the readings.

## 2010-09-09 NOTE — Telephone Encounter (Signed)
Message copied by Regis Bill on Mon Sep 09, 2010  5:24 PM ------      Message from: Willow Ora E      Created: Mon Sep 09, 2010  1:52 PM       Advise patient, BMP normal.

## 2010-09-12 NOTE — Telephone Encounter (Signed)
See 09/12/10 note/duplicate. 

## 2010-09-12 NOTE — Telephone Encounter (Signed)
Please check on him tomorrow, how is blood pressure?

## 2010-09-12 NOTE — Telephone Encounter (Signed)
LMOM [mobile; no answer/ans machine at home #] for patient to call back w/BP readings [see 09/09/10 note]

## 2010-09-13 ENCOUNTER — Telehealth: Payer: Self-pay | Admitting: Internal Medicine

## 2010-09-13 MED ORDER — LOSARTAN POTASSIUM 25 MG PO TABS: 25.0000 mg | ORAL_TABLET | Freq: Every day | ORAL | Status: DC

## 2010-09-13 NOTE — Telephone Encounter (Signed)
Spoke w/ pt aware of medication and instructions.

## 2010-09-13 NOTE — Telephone Encounter (Signed)
I received a letter from the patient, blood pressure in the last 3 days has been 165/71, 159/70, 136/70, 150/74, 144/85. Pulse in the 50s to low 60s. So his BP is slightly higher than before. Advise patient: Start losartan 25 mg one by mouth daily, call #[phone], 1 Rf Office visit in 6 weeks Call  if BP more than 140/85 or less than 110/70 or if side effects

## 2010-09-20 ENCOUNTER — Other Ambulatory Visit: Payer: Self-pay | Admitting: *Deleted

## 2010-09-20 MED ORDER — METOPROLOL SUCCINATE ER 25 MG PO TB24: 25.0000 mg | ORAL_TABLET | Freq: Every day | ORAL | Status: DC

## 2010-10-29 LAB — CBC
HCT: 37.7 — ABNORMAL LOW
Hemoglobin: 15.1
Hemoglobin: 13
MCHC: 34
MCV: 94
MCV: 94.4
Platelets: 243
RDW: 13.3
RDW: 13.5
WBC: 10.5

## 2010-10-29 LAB — DIFFERENTIAL
Basophils Relative: 0
Eosinophils Absolute: 0.2
Eosinophils Absolute: 0.4
Eosinophils Relative: 3
Eosinophils Relative: 5
Lymphocytes Relative: 28
Lymphs Abs: 1.8
Lymphs Abs: 2.3
Monocytes Absolute: 0.6
Monocytes Relative: 10
Monocytes Relative: 12
Neutrophils Relative %: 58

## 2010-10-29 LAB — COMPREHENSIVE METABOLIC PANEL
ALT: 25
ALT: 22
AST: 29
AST: 32
Albumin: 3.9
Alkaline Phosphatase: 53
CO2: 27
Calcium: 9.7
Calcium: 9.6
Creatinine, Ser: 0.94
GFR calc Af Amer: >60
GFR calc Af Amer: >60
GFR calc non Af Amer: >60
Glucose, Bld: 94
Potassium: 3.2 — ABNORMAL LOW
Sodium: 136
Sodium: 130 — ABNORMAL LOW
Total Protein: 7.4
Total Protein: 7.1

## 2010-10-29 LAB — TSH: TSH: 1.659

## 2010-10-29 LAB — MAGNESIUM: Magnesium: 1.8

## 2010-10-29 LAB — LIPASE, BLOOD
Lipase: 382 — ABNORMAL HIGH
Lipase: 56

## 2010-10-29 LAB — TROPONIN I: Troponin I: <0.01

## 2010-10-29 LAB — AMYLASE: Amylase: 60

## 2010-10-29 LAB — CK TOTAL AND CKMB (NOT AT ARMC): Relative Index: 1

## 2010-10-29 LAB — BASIC METABOLIC PANEL
BUN: 12
Chloride: 98
GFR calc non Af Amer: >60
Glucose, Bld: 108 — ABNORMAL HIGH
Potassium: 4.3
Sodium: 130 — ABNORMAL LOW

## 2010-10-29 LAB — CARDIAC PANEL(CRET KIN+CKTOT+MB+TROPI): Troponin I: <0.01

## 2010-10-30 ENCOUNTER — Ambulatory Visit (INDEPENDENT_AMBULATORY_CARE_PROVIDER_SITE_OTHER): Payer: Medicare Other

## 2010-10-30 DIAGNOSIS — Z23 Encounter for immunization: Secondary | ICD-10-CM

## 2010-11-01 ENCOUNTER — Encounter: Payer: Self-pay | Admitting: Internal Medicine

## 2010-11-01 ENCOUNTER — Ambulatory Visit (INDEPENDENT_AMBULATORY_CARE_PROVIDER_SITE_OTHER): Payer: Medicare Other | Admitting: Internal Medicine

## 2010-11-01 VITALS — BP 118/58 | HR 62 | Temp 98.3°F | Resp 14 | Wt 152.0 lb

## 2010-11-01 DIAGNOSIS — I10 Essential (primary) hypertension: Secondary | ICD-10-CM

## 2010-11-01 NOTE — Patient Instructions (Signed)
Came back next week for a BMP--dx HTN

## 2010-11-01 NOTE — Progress Notes (Signed)
  Subjective:    Patient ID: Michael Johns, male    DOB: 1932-02-15, 77 y.o.   MRN: [id number]  HPI Routine office visit Doing well Hypertension--see assessment and plan At night sometimes feel his heart pounding, lasts a few seconds, not associated with dizziness, chest pain or diaphoresis  Past Medical History  Diagnosis Date  . HYPERTENSION 05/19/2006  . ADENOCARCINOMA, PROSTATE 07/01/2006  . HYPERLIPIDEMIA, MILD 07/18/2009  . PROSTATECTOMY, HX OF 07/01/2006  . Gout     see 09-2008 note  . 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 Abdominal pain 11/09    due to constipation and ?pancreatitis- admitted    Review of Systems No apparent side effects from new BP med Constipation controlled as long as he takes his over-the-counter medications. Ambulatory blood pressures go up and down, from normal to 140/90, the patient is not sure about the accuracy of the readings.    Objective:   Physical Exam  Constitutional: He is oriented to person, place, and time. He appears well-developed and well-nourished.  HENT:  Head: Normocephalic and atraumatic.  Cardiovascular: Normal rate, regular rhythm and normal heart sounds.   Pulmonary/Chest: Effort normal and breath sounds normal. No respiratory distress. He has no wheezes. He has no rales.  Musculoskeletal: He exhibits no edema.  Neurological: He is alert and oriented to person, place, and time.          Assessment & Plan:

## 2010-11-01 NOTE — Assessment & Plan Note (Addendum)
Since the last office visit, his diuretic was discontinued , BP went up, now on losartan and doing well.  Ambulatory BP okay showing elevated but BP here is excellent. No change. Occasional palpitations without red flag symptoms: rec to  call me if anything changes. Labs Encouraged to continue checking his BP

## 2010-11-04 ENCOUNTER — Other Ambulatory Visit (INDEPENDENT_AMBULATORY_CARE_PROVIDER_SITE_OTHER): Payer: Medicare Other

## 2010-11-04 DIAGNOSIS — I10 Essential (primary) hypertension: Secondary | ICD-10-CM

## 2010-11-04 LAB — BASIC METABOLIC PANEL
BUN: 14 mg/dL (ref 6–23)
CO2: 29 mEq/L (ref 19–32)
Calcium: 9.1 mg/dL (ref 8.4–10.5)
Chloride: 101 mEq/L (ref 96–112)
Creatinine, Ser: 1.1 mg/dL (ref 0.4–1.5)
Glucose, Bld: 80 mg/dL (ref 70–99)

## 2010-11-04 NOTE — Progress Notes (Signed)
Labs only

## 2010-11-05 ENCOUNTER — Other Ambulatory Visit: Payer: Self-pay | Admitting: Internal Medicine

## 2010-11-05 NOTE — Telephone Encounter (Signed)
Done

## 2010-11-29 ENCOUNTER — Telehealth: Payer: Self-pay | Admitting: Internal Medicine

## 2010-11-29 MED ORDER — AMLODIPINE BESYLATE 5 MG PO TABS: 5.0000 mg | ORAL_TABLET | Freq: Every day | ORAL | Status: DC

## 2010-11-29 NOTE — Telephone Encounter (Signed)
Pt reports BPs varies widely and is not feeling too well since we d/c his diuretic (due to constipation which is now slt better) and started  Losartan; feels his  heart pounding, feels slt dizzy. No CP. Plan: D/c losartan Start amlodipine 5 mg Rx gaved to wife who is here, RTC in 1 month

## 2010-12-27 ENCOUNTER — Ambulatory Visit: Payer: Medicare Other | Admitting: Internal Medicine

## 2011-01-01 ENCOUNTER — Ambulatory Visit: Payer: Medicare Other | Admitting: Internal Medicine

## 2011-01-08 ENCOUNTER — Ambulatory Visit (INDEPENDENT_AMBULATORY_CARE_PROVIDER_SITE_OTHER): Payer: Medicare Other | Admitting: Internal Medicine

## 2011-01-08 VITALS — BP 118/60 | HR 64 | Temp 97.6°F | Ht 64.5 in | Wt 154.0 lb

## 2011-01-08 DIAGNOSIS — I10 Essential (primary) hypertension: Secondary | ICD-10-CM

## 2011-01-08 MED ORDER — AMLODIPINE BESYLATE 5 MG PO TABS: 5.0000 mg | ORAL_TABLET | Freq: Every day | ORAL | Status: DC

## 2011-01-08 NOTE — Progress Notes (Signed)
  Subjective:    Patient ID: Michael Johns, male    DOB: [birth year], 77 y.o.   MRN: [id number]  HPI routine followup   Past Medical History: Hypertension Prostate cancer, hx of (2008) ?gout (see 09-2008 note) R hear decreased > 40% x years  addominal pain: admited 11-09, due to constipation and ?pancreatitis   Past Surgical History: Appendectomy Tonsillectomy neg cardiolite but had tachycardia and couplets (09/2003).Marland KitchenMarland KitchenRx toprol rt middle ear surgery Prostatectomy (05/2007), LADs (-), bone scan (-) RIGHT Inguinal herniorrhaphy  (11-09)  Family History: Reviewed history from 10/15/2007 and no changes required. Father: esophageal Ca,  prostate ca-- no colon ca--no  HTN--M DM--M CAD--M (CHF, CABG 79y/0) MGM - MI   Social History: Married no children, wife has 3 daughters   retired-- worked for the [employer] exercise: golf 2/week, treadmill at home sometimes, active diet-- ok , eats at home most times; likes pizza, pork    Review of Systems Since the last office visit, we had to stop losartan due to palpitations. He is taking amlodipine,  good compliance and tolerance, ambulatory BPs varies from 120-159/70s. Most of the readings are within normal. No lower extremity edema    Objective:   Physical Exam  Constitutional: He is oriented to person, place, and time. He appears well-developed and well-nourished.  Cardiovascular: Normal rate, regular rhythm and normal heart sounds.   Musculoskeletal: He exhibits no edema.  Neurological: He is alert and oriented to person, place, and time.          Assessment & Plan:

## 2011-01-08 NOTE — Assessment & Plan Note (Signed)
Status post discontinuation of diuretics and losartan, tolerating amlodipine well, no edema. Plan: no change , low salt diet discussed

## 2011-01-08 NOTE — Patient Instructions (Signed)
Check the  blood pressure 2 or 3 times a week, be sure it is less than 140/85. If it is consistently higher, let me know  

## 2011-01-29 ENCOUNTER — Telehealth: Payer: Self-pay | Admitting: Gastroenterology

## 2011-01-29 NOTE — Telephone Encounter (Signed)
Pt calling wanting to schedule his colon. Recall due in October. Instructed pt to call back in a month to schedule his colon when Dr. Arlyce Dice is back doing procedures. Pt verbalized understanding.

## 2011-01-31 ENCOUNTER — Telehealth: Payer: Self-pay | Admitting: Internal Medicine

## 2011-01-31 NOTE — Telephone Encounter (Signed)
Left message to call office

## 2011-01-31 NOTE — Telephone Encounter (Signed)
That fine just have Pt come in for nurse visit for BP check.

## 2011-01-31 NOTE — Telephone Encounter (Signed)
Agree 

## 2011-01-31 NOTE — Telephone Encounter (Signed)
Patient called stating that he was started on new heart rx and he purchased a new bp monitor which reads his bp as 147/74 and is requesting that he come in and have the nurse compare it to the office bp cuff/monitor. Please advise.

## 2011-02-03 ENCOUNTER — Ambulatory Visit (INDEPENDENT_AMBULATORY_CARE_PROVIDER_SITE_OTHER): Payer: Medicare Other | Admitting: Internal Medicine

## 2011-02-03 ENCOUNTER — Telehealth: Payer: Self-pay

## 2011-02-03 VITALS — BP 132/70 | Temp 98.2°F | Wt 153.0 lb

## 2011-02-03 DIAGNOSIS — I10 Essential (primary) hypertension: Secondary | ICD-10-CM

## 2011-02-03 MED ORDER — HYDROCHLOROTHIAZIDE 12.5 MG PO TABS: 12.5000 mg | ORAL_TABLET | Freq: Every day | ORAL | Status: DC

## 2011-02-03 NOTE — Telephone Encounter (Signed)
Pt was in on 02/03/11 for  BP check.  Pt states since he has been on amlodipine 5 mg he has been experiencing heart pounding.  Pt states he gets higher BP readings on his monitor. Pt states when he was on the hctz it caused constipation but his BP readings were in the 120's.  Pt would like to know what Dr. Drue Novel suggest about his BP medication.  Pls advise.

## 2011-02-03 NOTE — Telephone Encounter (Signed)
Rx sent to pharmacy.  Pt aware of instructions and appt on 02/10/11.

## 2011-02-03 NOTE — Telephone Encounter (Signed)
Complaints of amlodipine causing "heart pounding", similar side effects to losartan. Plan: Discontinue amlodipine Re introduce HCTZ 12.5 mg once a day, call a new Rx or use the 25 mg tabs 1/2 a day . Drink plenty of fluids and followup in one week.

## 2011-02-04 NOTE — Assessment & Plan Note (Signed)
Here for a BP check, amlodipine pulse palpitations, plan: Re introduce HCTZ 12.5 mg once a day. Drink plenty of fluids and followup in one week.

## 2011-02-04 NOTE — Progress Notes (Signed)
  Subjective:    Patient ID: Michael Johns, male    DOB: 1932/04/27, 78 y.o.   MRN: [id number]  HPI   here for a BP check  Review of Systems     Objective:   Physical Exam        Assessment & Plan:

## 2011-02-05 ENCOUNTER — Encounter: Payer: Medicare Other | Admitting: Internal Medicine

## 2011-02-06 NOTE — Telephone Encounter (Signed)
Pt had a appt on 02-03-11.

## 2011-02-10 ENCOUNTER — Encounter: Payer: Self-pay | Admitting: Internal Medicine

## 2011-02-10 ENCOUNTER — Ambulatory Visit (INDEPENDENT_AMBULATORY_CARE_PROVIDER_SITE_OTHER): Payer: Medicare Other | Admitting: Internal Medicine

## 2011-02-10 VITALS — BP 140/70 | HR 63 | Ht 64.5 in | Wt 154.0 lb

## 2011-02-10 DIAGNOSIS — I10 Essential (primary) hypertension: Secondary | ICD-10-CM

## 2011-02-10 LAB — BASIC METABOLIC PANEL
BUN: 14 mg/dL (ref 6–23)
Calcium: 9.3 mg/dL (ref 8.4–10.5)
Creatinine, Ser: 1.1 mg/dL (ref 0.4–1.5)
GFR: 69.02 mL/min (ref >60.00)
Glucose, Bld: 67 mg/dL — ABNORMAL LOW (ref 70–99)
Potassium: 4.3 mEq/L (ref 3.5–5.1)

## 2011-02-10 NOTE — Patient Instructions (Signed)
Go back to amlodipine 5 mg 1/2 tab a day Check the  blood pressure 2 or 3 times a week, be sure it is less than 135/85. If it is consistently higher, let me know

## 2011-02-10 NOTE — Progress Notes (Signed)
  Subjective:    Patient ID: Michael Johns, male    DOB: 10/10/1932, 78 y.o.   MRN: [id number]  HPI Here for hypertension followup  Past Medical History: Hypertension Prostate cancer, hx of (2008) ?gout (see 09-2008 note) R hear decreased > 40% x years  addominal pain: admited 11-09, due to constipation and ?pancreatitis   Past Surgical History: Appendectomy Tonsillectomy neg cardiolite but had tachycardia and couplets (09/2003).Marland KitchenMarland KitchenRx toprol rt middle ear surgery Prostatectomy (05/2007), LADs (-), bone scan (-) RIGHT Inguinal herniorrhaphy  (11-09)  Family History: Father: esophageal Ca,  prostate ca-- no colon ca--no  HTN--M DM--M CAD--M (CHF, CABG 79y/0) MGM - MI   Social History: Married no children, wife has 3 daughters   retired-- worked for the [employer] exercise: golf 2/week, treadmill at home sometimes, active diet-- ok , eats at home most times; likes pizza, pork    Review of Systems Currently on Toprol and hydrochlorothiazide 12.5 mg daily. Ambulatory BPs around 140/80. Still has occasional palpitations, described as "simply awareness of my heartbeat". There is no chest pain, irregular heartbeat or skipping beats. Room for improvement on his diet, still eats a lot of fast food.    Objective:   Physical Exam  Constitutional: He is oriented to person, place, and time. He appears well-developed and well-nourished.  HENT:  Head: Normocephalic and atraumatic.  Cardiovascular: Normal rate, regular rhythm and normal heart sounds.   No murmur heard. Pulmonary/Chest: Effort normal and breath sounds normal. No respiratory distress. He has no wheezes. He has no rales.  Musculoskeletal: He exhibits no edema.  Neurological: He is alert and oriented to person, place, and time.      Assessment & Plan:

## 2011-02-10 NOTE — Assessment & Plan Note (Addendum)
Having issues with medication  tolerance, BP in the 140s, I would like to see his BP in the 130s. Patient is willing to try amlodipine again, he isn't sure if the palpitations "are only in my mind". Plan:  add half dose of amlodipine. Check a BMP Reassess in 2 month

## 2011-02-20 ENCOUNTER — Encounter: Payer: Self-pay | Admitting: Internal Medicine

## 2011-02-21 ENCOUNTER — Ambulatory Visit (INDEPENDENT_AMBULATORY_CARE_PROVIDER_SITE_OTHER): Payer: Medicare Other | Admitting: Internal Medicine

## 2011-02-21 ENCOUNTER — Other Ambulatory Visit: Payer: Self-pay | Admitting: Internal Medicine

## 2011-02-21 VITALS — BP 140/74 | HR 71 | Temp 97.9°F | Wt 153.0 lb

## 2011-02-21 DIAGNOSIS — R109 Unspecified abdominal pain: Secondary | ICD-10-CM

## 2011-02-21 DIAGNOSIS — I10 Essential (primary) hypertension: Secondary | ICD-10-CM

## 2011-02-21 DIAGNOSIS — R1030 Lower abdominal pain, unspecified: Secondary | ICD-10-CM

## 2011-02-21 MED ORDER — HYDROCHLOROTHIAZIDE 12.5 MG PO TABS: 12.5000 mg | ORAL_TABLET | Freq: Every day | ORAL | Status: DC

## 2011-02-21 NOTE — Progress Notes (Signed)
  Subjective:    Patient ID: Michael Johns, male    DOB: [birth year], 78 y.o.   MRN: [id number]  HPI  acute visit 6 days ago he moves heavy object and developed a pain in the left groin. 2 days later he got up and twisted his torso and the pain got worse. Also tolerating amlodipine 2.5 mg without side effects. BPs in the 140/80.  Past Surgical History:  Appendectomy  Tonsillectomy  neg cardiolite but had tachycardia and couplets (09/2003).Marland KitchenMarland KitchenRx toprol  rt middle ear surgery  Prostatectomy (05/2007), LADs (-), bone scan (-)  RIGHT Inguinal herniorrhaphy (11-09)   Family History:  Father: esophageal Ca,  prostate ca-- no  colon ca--no  HTN--M  DM--M  CAD--M (CHF, CABG 79y/0)  MGM - MI    Review of Systems No fever chills, no dysuria, gross hematuria. No nausea, vomiting, diarrhea blood in the stools. No testicular pain per se. has not feel any hernia.    Objective:   Physical Exam  Constitutional: He is oriented to person, place, and time. He appears well-developed and well-nourished. No distress.  HENT:  Head: Normocephalic and atraumatic.  Abdominal:         Abdomen is soft, nondistended, good bowel sounds. Mild soreness to touch at the area of perceived pain  Genitourinary:       Penis without lesions or discharge. Testicles riding very high, both are slightly tender to college symmetrically. No hernia on physical exam, no inguinal lymphadenopathy   Neurological: He is alert and oriented to person, place, and time.  Skin: He is not diaphoretic.        Assessment & Plan:  Groin pain: Most likely a musculoskeletal issue, I don't see a GU or GI pathology at this point. Both testicles are slightly sensitive to touch in a symmetric fashion. I doubt orchitis. See instructions.

## 2011-02-21 NOTE — Telephone Encounter (Signed)
Refill done.  

## 2011-02-21 NOTE — Patient Instructions (Signed)
No heavy lifting, tylenol as needed, call if no better in few days or if worse, fever, nausea, blood in the urine or stools Check the  blood pressure 2 or 3 times a week, be sure it is less than 140/85. If it is consistently higher, let me know

## 2011-02-21 NOTE — Assessment & Plan Note (Signed)
Tolerates amlodipine 2.5 mg, room for improvement in his control. Increase amlodipine to 5 mg.

## 2011-02-22 ENCOUNTER — Encounter: Payer: Self-pay | Admitting: Internal Medicine

## 2011-03-20 ENCOUNTER — Ambulatory Visit (AMBULATORY_SURGERY_CENTER): Payer: Medicare Other | Admitting: *Deleted

## 2011-03-20 DIAGNOSIS — Z860100 Personal history of colon polyps, unspecified: Secondary | ICD-10-CM

## 2011-03-20 DIAGNOSIS — Z1211 Encounter for screening for malignant neoplasm of colon: Secondary | ICD-10-CM

## 2011-03-20 MED ORDER — PEG-KCL-NACL-NASULF-NA ASC-C 100 G PO SOLR: ORAL | Status: DC

## 2011-03-20 NOTE — Progress Notes (Signed)
No allergy to eggs or soy products 

## 2011-03-31 ENCOUNTER — Telehealth: Payer: Self-pay | Admitting: Gastroenterology

## 2011-03-31 NOTE — Telephone Encounter (Signed)
Error

## 2011-04-03 ENCOUNTER — Ambulatory Visit (AMBULATORY_SURGERY_CENTER): Payer: Medicare Other | Admitting: Gastroenterology

## 2011-04-03 ENCOUNTER — Encounter: Payer: Self-pay | Admitting: Gastroenterology

## 2011-04-03 ENCOUNTER — Encounter: Payer: Medicare Other | Admitting: Gastroenterology

## 2011-04-03 DIAGNOSIS — Z86010 Personal history of colonic polyps: Secondary | ICD-10-CM

## 2011-04-03 DIAGNOSIS — Z1211 Encounter for screening for malignant neoplasm of colon: Secondary | ICD-10-CM

## 2011-04-03 MED ORDER — SODIUM CHLORIDE 0.9 % IV SOLN: 500.0000 mL | INTRAVENOUS | Status: DC

## 2011-04-03 NOTE — Progress Notes (Signed)
Patient did not experience any of the following events: a burn prior to discharge; a fall within the facility; wrong site/side/patient/procedure/implant event; or a hospital transfer or hospital admission upon discharge from the facility. (G8907) Patient did not have preoperative order for IV antibiotic SSI prophylaxis. (G8918)  

## 2011-04-03 NOTE — Op Note (Signed)
[facility] [address] [place], [place]  [address]  COLONOSCOPY PROCEDURE REPORT  PATIENT:  Michael Johns, Michael Johns  MR#:  [id number] BIRTHDATE:  1932-06-23, 78 yrs. old  GENDER:  male ENDOSCOPIST:  Barbette Hair. Arlyce Dice, MD REF. BY: PROCEDURE DATE:  04/03/2011 PROCEDURE:  Diagnostic Colonoscopy ASA CLASS:  Class II INDICATIONS:  Screening, history of pre-cancerous (adenomatous) colon polyps Index polypectomy 2003 colo 2008 - polyps MEDICATIONS:   MAC sedation, administered by CRNA propofol 130mg IV  DESCRIPTION OF PROCEDURE:   After the risks benefits and alternatives of the procedure were thoroughly explained, informed consent was obtained.  Digital rectal exam was performed and revealed no abnormalities.   The LB 180AL [id number] endoscope was introduced through the anus and advanced to the cecum, which was identified by both the appendix and ileocecal valve, without limitations.  The quality of the prep was good, using MoviPrep. The instrument was then slowly withdrawn as the colon was fully examined. <<PROCEDUREIMAGES>>  FINDINGS:  A normal appearing cecum, ileocecal valve, and appendiceal orifice were identified. The ascending, hepatic flexure, transverse, splenic flexure, descending, sigmoid colon, and rectum appeared unremarkable (see image2, image3, and image4). Retroflexed views in the rectum revealed no abnormalities.    The time to cecum =  1) 6.25  minutes. The scope was then withdrawn in 1) 8.0  minutes from the cecum and the procedure completed. COMPLICATIONS:  None ENDOSCOPIC IMPRESSION: 1) Normal colon RECOMMENDATIONS: 1) Call to schedule a follow-up appointment with office PRN REPEAT EXAM:  No  ______________________________ Barbette Hair. Arlyce Dice, MD  CC:  Willow Ora, MD  n. Rosalie Doctor:   Barbette Hair. Cheree Fowles at 04/03/2011 01:59 PM  [place], [place], [phone]

## 2011-04-03 NOTE — Patient Instructions (Signed)
YOU HAD AN ENDOSCOPIC PROCEDURE TODAY AT THE [facility]: Refer to the procedure report that was given to you for any specific questions about what was found during the examination.  If the procedure report does not answer your questions, please call your gastroenterologist to clarify.  If you requested that your care partner not be given the details of your procedure findings, then the procedure report has been included in a sealed envelope for you to review at your convenience later.  YOU SHOULD EXPECT: Some feelings of bloating in the abdomen. Passage of more gas than usual.  Walking can help get rid of the air that was put into your GI tract during the procedure and reduce the bloating. If you had a lower endoscopy (such as a colonoscopy or flexible sigmoidoscopy) you may notice spotting of blood in your stool or on the toilet paper. If you underwent a bowel prep for your procedure, then you may not have a normal bowel movement for a few days.  DIET: Your first meal following the procedure should be a light meal and then it is ok to progress to your normal diet.  A half-sandwich or bowl of soup is an example of a good first meal.  Heavy or fried foods are harder to digest and may make you feel nauseous or bloated.  Likewise meals heavy in dairy and vegetables can cause extra gas to form and this can also increase the bloating.  Drink plenty of fluids but you should avoid alcoholic beverages for 24 hours.  ACTIVITY: Your care partner should take you home directly after the procedure.  You should plan to take it easy, moving slowly for the rest of the day.  You can resume normal activity the day after the procedure however you should NOT DRIVE or use heavy machinery for 24 hours (because of the sedation medicines used during the test).    SYMPTOMS TO REPORT IMMEDIATELY: A gastroenterologist can be reached at any hour.  During normal business hours, 8:30 AM to 5:00 PM Monday through Friday,  call [phone].  After hours and on weekends, please call the GI answering service at [phone] who will take a message and have the physician on call contact you.   Following lower endoscopy (colonoscopy or flexible sigmoidoscopy):  Excessive amounts of blood in the stool  Significant tenderness or worsening of abdominal pains  Swelling of the abdomen that is new, acute  Fever of 100F or higher  FOLLOW UP: Our staff will call the home number listed on your records the next business day following your procedure to check on you and address any questions or concerns that you may have at that time regarding the information given to you following your procedure. This is a courtesy call and so if there is no answer at the home number and we have not heard from you through the emergency physician on call, we will assume that you have returned to your regular daily activities without incident.  SIGNATURES/CONFIDENTIALITY: You and/or your care partner have signed paperwork which will be entered into your electronic medical record.  These signatures attest to the fact that that the information above on your After Visit Summary has been reviewed and is understood.  Full responsibility of the confidentiality of this discharge information lies with you and/or your care-partner.   Please call the office as needed for a follow up appointment

## 2011-04-04 ENCOUNTER — Telehealth: Payer: Self-pay | Admitting: *Deleted

## 2011-04-04 NOTE — Telephone Encounter (Signed)
  Follow up Call-  Call back number 04/03/2011  Post procedure Call Back phone  # [phone]  Permission to leave phone message Yes     Patient questions:  Do you have a fever, pain , or abdominal swelling? no Pain Score  0 *  Have you tolerated food without any problems? yes  Have you been able to return to your normal activities? yes  Do you have any questions about your discharge instructions: Diet   no Medications  no Follow up visit  no  Do you have questions or concerns about your Care? no  Actions: * If pain score is 4 or above: No action needed, pain <4.

## 2011-04-15 ENCOUNTER — Encounter: Payer: Self-pay | Admitting: Internal Medicine

## 2011-04-16 ENCOUNTER — Ambulatory Visit (INDEPENDENT_AMBULATORY_CARE_PROVIDER_SITE_OTHER): Payer: Medicare Other | Admitting: Internal Medicine

## 2011-04-16 VITALS — BP 128/78 | HR 56 | Ht 64.5 in | Wt 151.0 lb

## 2011-04-16 DIAGNOSIS — I10 Essential (primary) hypertension: Secondary | ICD-10-CM

## 2011-04-16 DIAGNOSIS — E785 Hyperlipidemia, unspecified: Secondary | ICD-10-CM

## 2011-04-16 DIAGNOSIS — C61 Malignant neoplasm of prostate: Secondary | ICD-10-CM

## 2011-04-16 DIAGNOSIS — Z0000 Encounter for general adult medical examination without abnormal findings: Secondary | ICD-10-CM

## 2011-04-16 DIAGNOSIS — Z23 Encounter for immunization: Secondary | ICD-10-CM

## 2011-04-16 LAB — CBC WITH DIFFERENTIAL/PLATELET
Basophils Absolute: 0 10*3/uL (ref 0.0–0.1)
Eosinophils Relative: 3.7 % (ref 0.0–5.0)
HCT: 40.9 % (ref 39.0–52.0)
Lymphocytes Relative: 31.1 % (ref 12.0–46.0)
Lymphs Abs: 1.9 10*3/uL (ref 0.7–4.0)
Monocytes Relative: 10.7 % (ref 3.0–12.0)
Platelets: 255 10*3/uL (ref 150.0–400.0)
RDW: 13.6 % (ref 11.5–14.6)
WBC: 6.1 10*3/uL (ref 4.5–10.5)

## 2011-04-16 LAB — LIPID PANEL
Total CHOL/HDL Ratio: 5
Triglycerides: 139 mg/dL (ref 0.0–149.0)
VLDL: 27.8 mg/dL (ref 0.0–40.0)

## 2011-04-16 LAB — LDL CHOLESTEROL, DIRECT: Direct LDL: 150.2 mg/dL

## 2011-04-16 LAB — PSA: PSA: 0.01 ng/mL — ABNORMAL LOW (ref 0.10–4.00)

## 2011-04-16 NOTE — Assessment & Plan Note (Signed)
Has not seen urology in a while, was told that as long as PSA was checked, it would be ok to f/u prn Doing well Labs

## 2011-04-16 NOTE — Assessment & Plan Note (Signed)
On lifestyle control only, LDL goal <130 Labs

## 2011-04-16 NOTE — Progress Notes (Signed)
  Subjective:    Patient ID: Michael Johns, male    DOB: Jul 22, 1932, 78 y.o.   MRN: [id number]  HPI Here for Medicare AWV: 1. Risk factors based on Past M, S, F history: reviewed  2. 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 3. Depression/mood: no problems noted or reported  4. Hearing: R hear decreased > 40% x years , uses a hearing aid on-off . No problems @ normal                        conversation  5. ADL's: independent  6. Fall Risk: no recent, prevention discussed   7. Home Safety: does feels safe at home  8. Height, weight, &visual acuity: see VS, sees eye doctor yearly, wear glasses  9. Counseling: yes  10. Labs ordered based on risk factors: yes 11.           Referral Coordination, if needed 12.           Care Plan, see assessment and plan 13.            Cognitive Assessment: cognition & motor skills seem appropriate. Good memory  in addition, we discussed the following Hypertension-- good medication compliance , ambulatory BPs wnl , <130 in the last 2 weeks, no apparent s/e from amlodipine except for occ palpitations (last seconds, no pre syncope) Prostate cancer, has not seen urology in a while  ?gout  -- no episodes   Past Medical History: Hypertension Prostate cancer, hx of (2008) ?gout (see 09-2008 note) R hear decreased > 40% x years  addominal pain: admited 11-09, due to constipation and ?pancreatitis   Past Surgical History:  Appendectomy  Tonsillectomy  neg cardiolite but had tachycardia and couplets (09/2003).Marland KitchenMarland KitchenRx toprol  rt middle ear surgery  Prostatectomy (05/2007), LADs (-), bone scan (-)  RIGHT Inguinal herniorrhaphy (11-09)   Family History:  Father--esophageal Ca,  prostate ca-- no  colon ca--uncle   HTN--M  DM--M  CAD--M (CHF, CABG 81y/0)  MGM - MI   Social History: Re-married, no children, [name] , present wife , has 3 daughters   retired-- worked for the [employer]-- no ETOH-- socially    exercise: golf 2/week, treadmill at home sometimes, active diet-- ok , eats at home most times, occ fast food    Review of Systems  Constitutional: Negative for fever, fatigue and unexpected weight change.  Respiratory: Negative for cough and shortness of breath.   Cardiovascular: Negative for chest pain and leg swelling.  Gastrointestinal: Negative for abdominal pain and blood in stool.       Objective:   Physical Exam  General:  alert, well-developed, and well-nourished.   Neck:  full ROM and no masses.  normal carotid pulses, no thyromegaly Lungs:  normal respiratory effort, no intercostal retractions, no accessory muscle use, and normal breath sounds.   Heart:  normal rate, regular rhythm, and no murmur.   Abdomen:  soft, non-tender, no distention, and no masses.  no bruit  Extremities:  no pretibial edema bilaterally  DRE- no mass, brown stools Neurologic:  alert & oriented X3, strength normal in all extremities, and gait normal.   Psych:  Cognition and judgment appear intact. Alert and cooperative with normal attention span and concentration.  not anxious appearing and not depressed appearing.         Assessment & Plan:

## 2011-04-16 NOTE — Assessment & Plan Note (Addendum)
Td 2003 and today pneumonia shot 2009 shingles shot 2-09  very healthy life style, praised, counseled   Cscope 10-08, Adenomatous polyp, repeated cscope 03-2011 Dr Arlyce Dice  diet and exercise discussed

## 2011-04-16 NOTE — Assessment & Plan Note (Signed)
Well controlled, tolerated amlodipine no change

## 2011-04-17 ENCOUNTER — Encounter: Payer: Self-pay | Admitting: Internal Medicine

## 2011-04-20 ENCOUNTER — Encounter: Payer: Self-pay | Admitting: Internal Medicine

## 2011-04-24 ENCOUNTER — Telehealth: Payer: Self-pay | Admitting: *Deleted

## 2011-04-24 NOTE — Telephone Encounter (Signed)
Pt called back stating that he is interesting in starting the low dose cholesterol med that Dr Drue Novel suggested. .Please advise on med, dosing and instruction.

## 2011-04-26 NOTE — Telephone Encounter (Signed)
rec to start lipitor (generic ok) 10 mg 1 po qhs #30, 6 RF Schedule labs in 2 months: FLP AST ALT -- high cholesterol

## 2011-04-27 ENCOUNTER — Encounter: Payer: Self-pay | Admitting: Internal Medicine

## 2011-04-28 MED ORDER — ATORVASTATIN CALCIUM 10 MG PO TABS: 10.0000 mg | ORAL_TABLET | Freq: Every day | ORAL | Status: DC

## 2011-04-28 NOTE — Telephone Encounter (Signed)
Labs only appointment  in 2 months OV  in 8 months

## 2011-04-28 NOTE — Telephone Encounter (Signed)
Pt called stating that you told him to make a follow-up appt in 8 months. He currently has an appt on April 15. Does he need to cancel the April 15 appt & set up one for 8 months? Please advise.

## 2011-04-28 NOTE — Telephone Encounter (Signed)
Replied to pt on mychart

## 2011-05-12 ENCOUNTER — Ambulatory Visit: Payer: Medicare Other | Admitting: Internal Medicine

## 2011-05-21 ENCOUNTER — Telehealth: Payer: Self-pay | Admitting: *Deleted

## 2011-05-21 MED ORDER — ATORVASTATIN CALCIUM 10 MG PO TABS: 10.0000 mg | ORAL_TABLET | Freq: Every day | ORAL | Status: DC

## 2011-05-21 NOTE — Telephone Encounter (Signed)
Refill done.  

## 2011-05-24 ENCOUNTER — Encounter: Payer: Self-pay | Admitting: Internal Medicine

## 2011-05-26 NOTE — Telephone Encounter (Signed)
In the most recent labs it states that you will recheck the pt when he returns for a visit. He does not have any future appts. Does he need to make one? If so, when?

## 2011-05-28 ENCOUNTER — Telehealth: Payer: Self-pay | Admitting: *Deleted

## 2011-05-28 DIAGNOSIS — E7800 Pure hypercholesterolemia, unspecified: Secondary | ICD-10-CM

## 2011-06-03 NOTE — Telephone Encounter (Signed)
Error

## 2011-06-26 ENCOUNTER — Other Ambulatory Visit (INDEPENDENT_AMBULATORY_CARE_PROVIDER_SITE_OTHER): Payer: Medicare Other

## 2011-06-26 DIAGNOSIS — E7800 Pure hypercholesterolemia, unspecified: Secondary | ICD-10-CM

## 2011-06-26 LAB — LIPID PANEL
Cholesterol: 130 mg/dL (ref 0–200)
HDL: 50.4 mg/dL (ref >39.00)
Triglycerides: 85 mg/dL (ref 0.0–149.0)
VLDL: 17 mg/dL (ref 0.0–40.0)

## 2011-06-26 LAB — ALT: ALT: 25 U/L (ref 0–53)

## 2011-06-26 LAB — AST: AST: 28 U/L (ref 0–37)

## 2011-06-26 NOTE — Progress Notes (Signed)
Labs only

## 2011-09-05 ENCOUNTER — Ambulatory Visit (INDEPENDENT_AMBULATORY_CARE_PROVIDER_SITE_OTHER): Payer: Medicare Other | Admitting: Family Medicine

## 2011-09-05 ENCOUNTER — Encounter: Payer: Self-pay | Admitting: Family Medicine

## 2011-09-05 VITALS — BP 120/68 | HR 84 | Temp 98.3°F | Wt 150.4 lb

## 2011-09-05 DIAGNOSIS — J40 Bronchitis, not specified as acute or chronic: Secondary | ICD-10-CM

## 2011-09-05 MED ORDER — AZITHROMYCIN 250 MG PO TABS: ORAL_TABLET | ORAL | Status: DC

## 2011-09-05 MED ORDER — AZITHROMYCIN 250 MG PO TABS: ORAL_TABLET | ORAL | Status: AC

## 2011-09-05 NOTE — Progress Notes (Signed)
  Subjective:     Michael Johns is a 78 y.o. male who presents for evaluation of symptoms of a URI. Symptoms include congestion, coryza, nasal congestion, post nasal drip, productive cough with  green colored sputum and shortness of breath. Onset of symptoms was 4 days ago, and has been gradually worsening since that time. Treatment to date: antihistamines.  The following portions of the patient's history were reviewed and updated as appropriate: allergies, current medications, past family history, past medical history, past social history, past surgical history and problem list.  Review of Systems Pertinent items are noted in HPI.   Objective:    BP 120/68  Pulse 84  Temp 98.3 F (36.8 C) (Oral)  Wt 150 lb 6.4 oz (68.221 kg)  SpO2 97% General appearance: alert, cooperative, appears stated age and no distress Ears: normal TM's and external ear canals both ears Nose: clear discharge, mild congestion, no sinus tenderness Throat: lips, mucosa, and tongue normal; teeth and gums normal Neck: no adenopathy, supple, symmetrical, trachea midline and thyroid not enlarged, symmetric, no tenderness/mass/nodules Lungs: clear to auscultation bilaterally   Assessment:    bronchitis   Plan:    Discussed the importance of avoiding unnecessary antibiotic therapy. Suggested symptomatic OTC remedies. Follow up as needed.  rx given to fill if symptoms do not improve con't claritin and start mucinex

## 2011-09-05 NOTE — Patient Instructions (Signed)

## 2011-10-19 ENCOUNTER — Encounter: Payer: Self-pay | Admitting: Internal Medicine

## 2011-10-27 ENCOUNTER — Other Ambulatory Visit: Payer: Self-pay | Admitting: Internal Medicine

## 2011-10-27 MED ORDER — METOPROLOL SUCCINATE ER 25 MG PO TB24: 25.0000 mg | ORAL_TABLET | Freq: Every day | ORAL | Status: DC

## 2011-10-27 NOTE — Telephone Encounter (Signed)
Refill done.  

## 2011-10-27 NOTE — Telephone Encounter (Signed)
Refill Metoprolol Succ 25 MG Take 1 tablet (25 mg total) by mouth daily requesting90-day supply Last ov 8.9.13 acute Last wrt 8.24.12 #30 3-refills

## 2011-10-29 ENCOUNTER — Ambulatory Visit (INDEPENDENT_AMBULATORY_CARE_PROVIDER_SITE_OTHER): Payer: Medicare Other

## 2011-10-29 DIAGNOSIS — Z23 Encounter for immunization: Secondary | ICD-10-CM

## 2011-11-03 ENCOUNTER — Telehealth: Payer: Self-pay | Admitting: Internal Medicine

## 2011-11-03 MED ORDER — AMLODIPINE BESYLATE 5 MG PO TABS: 5.0000 mg | ORAL_TABLET | Freq: Every day | ORAL | Status: DC

## 2011-11-03 NOTE — Telephone Encounter (Signed)
Refill done.  

## 2011-11-03 NOTE — Telephone Encounter (Signed)
Refill: Amlodipine tab 5 mg. Take 1 tablet by mouth daily. Qty 90. Last fill 9.28.13  Refill: Atorvastatin tab.

## 2011-11-04 MED ORDER — ATORVASTATIN CALCIUM 10 MG PO TABS: 10.0000 mg | ORAL_TABLET | Freq: Every day | ORAL | Status: DC

## 2011-11-04 NOTE — Addendum Note (Signed)
Addended by: Edwena Felty T on: 11/04/2011 10:40 AM   Modules accepted: Orders

## 2011-11-04 NOTE — Telephone Encounter (Signed)
Refill done.  

## 2011-12-30 ENCOUNTER — Ambulatory Visit: Payer: Medicare Other | Admitting: Internal Medicine

## 2012-01-01 ENCOUNTER — Ambulatory Visit (INDEPENDENT_AMBULATORY_CARE_PROVIDER_SITE_OTHER): Payer: Medicare Other | Admitting: Internal Medicine

## 2012-01-01 VITALS — BP 124/74 | HR 61 | Temp 97.8°F | Wt 153.0 lb

## 2012-01-01 DIAGNOSIS — E785 Hyperlipidemia, unspecified: Secondary | ICD-10-CM

## 2012-01-01 DIAGNOSIS — I10 Essential (primary) hypertension: Secondary | ICD-10-CM

## 2012-01-01 LAB — LIPID PANEL
HDL: 46.7 mg/dL (ref >39.00)
LDL Cholesterol: 76 mg/dL (ref 0–99)
Total CHOL/HDL Ratio: 3
Triglycerides: 69 mg/dL (ref 0.0–149.0)
VLDL: 13.8 mg/dL (ref 0.0–40.0)

## 2012-01-01 LAB — BASIC METABOLIC PANEL
CO2: 31 mEq/L (ref 19–32)
Chloride: 96 mEq/L (ref 96–112)
Creatinine, Ser: 1.1 mg/dL (ref 0.4–1.5)
Potassium: 4.4 mEq/L (ref 3.5–5.1)
Sodium: 134 mEq/L — ABNORMAL LOW (ref 135–145)

## 2012-01-01 NOTE — Progress Notes (Signed)
  Subjective:    Patient ID: Michael Johns, male    DOB: [birth year], 78 y.o.   MRN: [id number]  HPI Routine office visit Good medication compliance Since he switch from simvastatin to Lipitor has noted more leg cramps, symptoms are mild and not frequent. He does not think he needs to change therapy.  Past Medical History: Hypertension Prostate cancer, hx of (2008) ?gout (see 09-2008 note) R hear decreased > 40% x years   addominal pain: admited 11-09, due to constipation and ?pancreatitis   Past Surgical History:   Appendectomy   Tonsillectomy   neg cardiolite but had tachycardia and couplets (09/2003).Marland KitchenMarland KitchenRx toprol   rt middle ear surgery   Prostatectomy (05/2007), LADs (-), bone scan (-)   RIGHT Inguinal herniorrhaphy (11-09)   Family History:   Father--esophageal Ca,   prostate ca-- no   colon ca--uncle    HTN--M   DM--M   CAD--M (CHF, CABG 81y/0)   MGM - MI   Social History: Re-married, no children, [name] , present wife , has 3 daughters    retired-- worked for the [employer]-- no ETOH-- socially   exercise: golf 2/week, treadmill at home sometimes, active diet-- ok , eats at home most times, occ fast food   Review of Systems No chest pain or shortness of breath. No claudication No nausea, vomiting, diarrhea.    Objective:   Physical Exam  General -- alert, well-developed, and well-nourished.   Lungs -- normal respiratory effort, no intercostal retractions, no accessory muscle use, and normal breath sounds.   Heart-- normal rate, regular rhythm, no murmur, and no gallop.   Extremities-- no pretibial edema bilaterally Psych-- Cognition and judgment appear intact. Alert and cooperative with normal attention span and concentration.  not anxious appearing and not depressed appearing.      Assessment & Plan:

## 2012-01-01 NOTE — Assessment & Plan Note (Signed)
Well-controlled, check a BMP, recommend to check his BP from time to time.

## 2012-01-01 NOTE — Assessment & Plan Note (Signed)
Good compliance with Lipitor, occasionally has leg cramps, unclear if cramps related to Lipitor. Plan: FLP, CK. Continue with Lipitor

## 2012-01-02 ENCOUNTER — Encounter: Payer: Self-pay | Admitting: Internal Medicine

## 2012-01-28 HISTORY — PX: OTHER SURGICAL HISTORY: SHX169

## 2012-02-04 ENCOUNTER — Other Ambulatory Visit: Payer: Self-pay | Admitting: Internal Medicine

## 2012-02-04 NOTE — Telephone Encounter (Signed)
Refill done.  

## 2012-04-01 ENCOUNTER — Other Ambulatory Visit: Payer: Self-pay | Admitting: Internal Medicine

## 2012-04-01 NOTE — Telephone Encounter (Signed)
Refill done.  

## 2012-04-23 ENCOUNTER — Ambulatory Visit (INDEPENDENT_AMBULATORY_CARE_PROVIDER_SITE_OTHER): Payer: Medicare Other | Admitting: Internal Medicine

## 2012-04-23 ENCOUNTER — Encounter: Payer: Self-pay | Admitting: Internal Medicine

## 2012-04-23 VITALS — BP 118/66 | HR 57 | Temp 97.9°F | Ht 64.0 in | Wt 155.0 lb

## 2012-04-23 DIAGNOSIS — I10 Essential (primary) hypertension: Secondary | ICD-10-CM

## 2012-04-23 DIAGNOSIS — Z0000 Encounter for general adult medical examination without abnormal findings: Secondary | ICD-10-CM

## 2012-04-23 DIAGNOSIS — C61 Malignant neoplasm of prostate: Secondary | ICD-10-CM

## 2012-04-23 DIAGNOSIS — R1310 Dysphagia, unspecified: Secondary | ICD-10-CM

## 2012-04-23 DIAGNOSIS — E785 Hyperlipidemia, unspecified: Secondary | ICD-10-CM

## 2012-04-23 LAB — BASIC METABOLIC PANEL
CO2: 30 mEq/L (ref 19–32)
Chloride: 96 mEq/L (ref 96–112)
Creatinine, Ser: 1.2 mg/dL (ref 0.4–1.5)

## 2012-04-23 LAB — CBC WITH DIFFERENTIAL/PLATELET
Basophils Relative: 0.7 % (ref 0.0–3.0)
Eosinophils Absolute: 0.3 10*3/uL (ref 0.0–0.7)
HCT: 39.1 % (ref 39.0–52.0)
Hemoglobin: 13.3 g/dL (ref 13.0–17.0)
Lymphs Abs: 2.1 10*3/uL (ref 0.7–4.0)
MCHC: 34.1 g/dL (ref 30.0–36.0)
MCV: 91 fl (ref 78.0–100.0)
Monocytes Absolute: 0.9 10*3/uL (ref 0.1–1.0)
Neutro Abs: 3.9 10*3/uL (ref 1.4–7.7)
Neutrophils Relative %: 54.2 % (ref 43.0–77.0)
RBC: 4.29 Mil/uL (ref 4.22–5.81)

## 2012-04-23 MED ORDER — OMEPRAZOLE 20 MG PO CPDR: 20.0000 mg | DELAYED_RELEASE_CAPSULE | Freq: Every day | ORAL | Status: DC

## 2012-04-23 NOTE — Assessment & Plan Note (Signed)
See previous entry, check PSA

## 2012-04-23 NOTE — Assessment & Plan Note (Addendum)
Td 2013 pneumonia shot 2009 shingles shot 2-09  very healthy life style, praised, counseled   Cscope 10-08, Adenomatous polyp,  cscope again  03-2011 Dr Arlyce Dice  diet and exercise discussed

## 2012-04-23 NOTE — Assessment & Plan Note (Addendum)
79 year old gentleman with a new onset of dysphagia, regurgitation. His father has esophageal cancer. Although he does not have chronic GERD and he was not a smoker I'm  still somehow concerned about his symptoms. Plan: Omeprazole Refer to GI for evaluation: ?EGD

## 2012-04-23 NOTE — Patient Instructions (Addendum)
Next visit in 6 months, please make an appointment  

## 2012-04-23 NOTE — Progress Notes (Signed)
Subjective:    Patient ID: Michael Johns, male    DOB: [birth year], 79 y.o.   MRN: [id number]  HPI Here for Medicare AWV: 1. Risk factors based on Past M, S, F history: reviewed   2. 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 Depression/mood: neg screening   4. Hearing: R hear decreased > 40% x years , uses a hearing aid on-off.  5. ADL's: independent   6. Fall Risk: no recent falls, prevention discussed    7.  Home Safety: does feels safe at home   8. Height, weight, &visual acuity: see VS, sees eye doctor yearly, wear glasses   9.         Counseling: yes   10. Labs ordered based on risk factors: yes 11. referral Coordination, if needed 12. Care Plan, see assessment and plan 13. Cognitive Assessment: cognition & motor skills seem appropriate. Good memory  in addition, we discussed the following For the last 6 months has noted some acid regurgitation when he bends over. On further questioning, he also reports that solids are not going down the esophagus "as smooth as before". Denies odynophagia, no change in the color of his stools, no weight loss. High cholesterol, good medication compliance, 2 or 3 times a week has either leg cramps or feet cramps at night. Thinks symptoms started with Lipitor use. Hypertension, good medication compliance, no ambulatory BPs.   Past Medical History  Diagnosis Date  . HYPERTENSION 05/19/2006  . ADENOCARCINOMA, PROSTATE 07/01/2006  . HYPERLIPIDEMIA, MILD 07/18/2009  . PROSTATECTOMY, HX OF 07/01/2006  . Gout     see 09-2008 note  . 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 Abdominal pain 11/09    due to constipation and ?pancreatitis- admitted    Past Surgical History  Procedure Laterality Date  . Appendectomy    . Tonsillectomy    . Prostatectomy  05/2007    LADS (-) Bone scan (-)  . Inguinal hernia repair  11/2007    right  . Rt middle ear surgery    . Colonoscopy    . Polypectomy     . Sinus polyp removed      Family History  Problem Relation Age of Onset  . Hypertension Mother   . Diabetes Mother   . Heart disease Mother     CAD  . Cancer Father     Esophageal  . Esophageal cancer Father   . Heart disease Maternal Grandmother     MI  . Prostate cancer Neg Hx   . Colon cancer Neg Hx   . Rectal cancer Neg Hx   . Stomach cancer Neg Hx    Social History: Re-married, no children, [name] , present wife has 3 daughters    retired-- worked for the [employer]-- no ETOH-- socially   diet-- ok , eats at home most times, occ fast food   Review of Systems No nausea, vomiting, diarrhea. No generalized myalgias per se. No cough or difficulty breathing.     Objective:   Physical Exam General -- alert, well-developed .   Neck --no thyromegaly , no supraclavicular abnormalities. Lungs -- normal respiratory effort, no intercostal retractions, no accessory muscle use, and normal breath sounds.   Heart-- normal rate, regular rhythm, no murmur, and no gallop.   Abdomen--soft, non-tender, no distention, no masses, no HSM, no guarding, and no rigidity.   Extremities-- no pretibial edema bilaterally  Neurologic-- alert & oriented X3 and strength normal in all extremities. Psych-- Cognition and judgment appear intact. Alert and cooperative with normal attention span and concentration.  not anxious appearing and not depressed appearing.       Assessment & Plan:

## 2012-04-23 NOTE — Assessment & Plan Note (Addendum)
No ambulatory BPs, BPs at the office always good.  Plan: Continue same meds, check labs.

## 2012-04-23 NOTE — Assessment & Plan Note (Addendum)
Good compliance with medication, last cholesterol very good. Has cramps sometimes, unclear if related to Lipitor. Previous CK negative. At this point benefits outweighed mild side effects. Continue with Lipitor, recommend tonic water at night.

## 2012-04-26 ENCOUNTER — Encounter: Payer: Self-pay | Admitting: Gastroenterology

## 2012-05-12 ENCOUNTER — Encounter: Payer: Self-pay | Admitting: Internal Medicine

## 2012-05-16 ENCOUNTER — Other Ambulatory Visit: Payer: Self-pay | Admitting: Internal Medicine

## 2012-05-17 NOTE — Telephone Encounter (Signed)
Refill done.  

## 2012-05-18 ENCOUNTER — Other Ambulatory Visit: Payer: Self-pay | Admitting: Internal Medicine

## 2012-05-19 NOTE — Telephone Encounter (Signed)
Refill done.  

## 2012-05-21 ENCOUNTER — Telehealth: Payer: Self-pay | Admitting: Internal Medicine

## 2012-05-21 MED ORDER — OMEPRAZOLE 20 MG PO CPDR: 20.0000 mg | DELAYED_RELEASE_CAPSULE | Freq: Every day | ORAL | Status: DC

## 2012-05-21 NOTE — Telephone Encounter (Signed)
New prescription request  Omeprazole tablet

## 2012-05-21 NOTE — Telephone Encounter (Signed)
Refill done.  

## 2012-05-24 ENCOUNTER — Ambulatory Visit: Payer: PRIVATE HEALTH INSURANCE | Admitting: Gastroenterology

## 2012-08-03 ENCOUNTER — Other Ambulatory Visit: Payer: Self-pay | Admitting: *Deleted

## 2012-08-03 MED ORDER — OMEPRAZOLE 20 MG PO CPDR: 20.0000 mg | DELAYED_RELEASE_CAPSULE | Freq: Every day | ORAL | Status: DC

## 2012-08-03 MED ORDER — HYDROCHLOROTHIAZIDE 12.5 MG PO TABS: ORAL_TABLET | ORAL | Status: DC

## 2012-08-03 NOTE — Telephone Encounter (Signed)
Rx sent 

## 2012-10-20 ENCOUNTER — Ambulatory Visit (INDEPENDENT_AMBULATORY_CARE_PROVIDER_SITE_OTHER): Payer: Medicare Other

## 2012-10-20 DIAGNOSIS — Z23 Encounter for immunization: Secondary | ICD-10-CM

## 2012-10-27 ENCOUNTER — Ambulatory Visit: Payer: PRIVATE HEALTH INSURANCE | Admitting: Internal Medicine

## 2012-11-12 ENCOUNTER — Ambulatory Visit (INDEPENDENT_AMBULATORY_CARE_PROVIDER_SITE_OTHER): Payer: Medicare Other | Admitting: Internal Medicine

## 2012-11-12 ENCOUNTER — Encounter: Payer: Self-pay | Admitting: Internal Medicine

## 2012-11-12 VITALS — BP 103/62 | HR 59 | Temp 98.4°F | Wt 153.5 lb

## 2012-11-12 DIAGNOSIS — I10 Essential (primary) hypertension: Secondary | ICD-10-CM

## 2012-11-12 DIAGNOSIS — E785 Hyperlipidemia, unspecified: Secondary | ICD-10-CM

## 2012-11-12 NOTE — Assessment & Plan Note (Signed)
BP slightly low, he is asymptomatic, reports he started a exercise program few weeks ago thus that may be the reason for his better BP. Plan: Close monitoring of his BP, if it is consistently low or if he has symptoms he will let me know.

## 2012-11-12 NOTE — Assessment & Plan Note (Signed)
Good medication compliance, due for labs 

## 2012-11-12 NOTE — Progress Notes (Signed)
  Subjective:    Patient ID: Michael Johns, male    DOB: February 18, 1932, 79 y.o.   MRN: [id number]  HPI ROV Hypertension, high cholesterol--good medication compliance, ambulatory BPs usually within normal, readings?. BP today is a slightly low but he does not feel weak or dizzy.   Past Medical History  Diagnosis Date  . HYPERTENSION 05/19/2006  . ADENOCARCINOMA, PROSTATE 07/01/2006  . HYPERLIPIDEMIA, MILD 07/18/2009  . PROSTATECTOMY, HX OF 07/01/2006  . Gout     see 09-2008 note  . 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 Abdominal pain 11/09    due to constipation and ?pancreatitis- admitted   Past Surgical History  Procedure Laterality Date  . Appendectomy    . Tonsillectomy    . Prostatectomy  05/2007    LADS (-) Bone scan (-)  . Inguinal hernia repair  11/2007    right  . Rt middle ear surgery    . Colonoscopy    . Polypectomy    . Sinus polyp removed    . Cataracts  2014    B    Social History: Re-married, no children, Michael Johns , present wife has 3 daughters    retired-- worked for the [employer]-- no ETOH-- socially       Review of Systems Denies chest pain or pressure   No nausea, vomiting, diarrhea. Constipation well-controlled.     Objective:   Physical Exam BP 103/62  Pulse 59  Temp(Src) 98.4 F (36.9 C)  Wt 153 lb 8 oz (69.627 kg)  BMI 26.34 kg/m2  SpO2 99% General -- alert, well-developed, NAD.  Neck -- normal carotid pulse Lungs -- normal respiratory effort, no intercostal retractions, no accessory muscle use, and normal breath sounds.  Heart-- normal rate, regular rhythm, no murmur.   Extremities-- no pretibial edema bilaterally  Neurologic--  alert & oriented X3. Speech normal, gait normal, strength normal in all extremities.    Psych-- Cognition and judgment appear intact. Cooperative with normal attention span and concentration. No anxious appearing , no depressed appearing.     Assessment & Plan:

## 2012-11-12 NOTE — Patient Instructions (Addendum)
please come back fasting: FLP, AST, ALT-- hyperlipidemia BMP,-- dx HTN  Check the  blood pressure 2 or 3 times a month   be sure it is between 110/60 and 140/85. Ideal blood pressure is 120/80. If it is consistently higher or lower, let me know  Next visit : 6 months for a physical

## 2012-11-13 ENCOUNTER — Encounter: Payer: Self-pay | Admitting: Internal Medicine

## 2012-11-16 ENCOUNTER — Other Ambulatory Visit: Payer: Self-pay | Admitting: Internal Medicine

## 2012-11-16 ENCOUNTER — Other Ambulatory Visit (INDEPENDENT_AMBULATORY_CARE_PROVIDER_SITE_OTHER): Payer: Medicare Other

## 2012-11-16 DIAGNOSIS — I10 Essential (primary) hypertension: Secondary | ICD-10-CM

## 2012-11-16 DIAGNOSIS — E785 Hyperlipidemia, unspecified: Secondary | ICD-10-CM

## 2012-11-16 LAB — BASIC METABOLIC PANEL
BUN: 14 mg/dL (ref 6–23)
CO2: 31 mEq/L (ref 19–32)
Creatinine, Ser: 1 mg/dL (ref 0.4–1.5)
GFR: 75.82 mL/min (ref >60.00)
Glucose, Bld: 91 mg/dL (ref 70–99)
Potassium: 4.4 mEq/L (ref 3.5–5.1)
Sodium: 132 mEq/L — ABNORMAL LOW (ref 135–145)

## 2012-11-16 LAB — LIPID PANEL
HDL: 40.3 mg/dL (ref >39.00)
Total CHOL/HDL Ratio: 3
Triglycerides: 81 mg/dL (ref 0.0–149.0)

## 2012-11-16 LAB — ALT: ALT: 19 U/L (ref 0–53)

## 2013-02-21 ENCOUNTER — Other Ambulatory Visit: Payer: Self-pay | Admitting: Internal Medicine

## 2013-02-21 NOTE — Telephone Encounter (Signed)
Amlodipine, Atorvastatin and Metoprolol refilled per protocol. JG//CMA

## 2013-04-25 ENCOUNTER — Other Ambulatory Visit: Payer: Self-pay | Admitting: Internal Medicine

## 2013-05-05 ENCOUNTER — Telehealth: Payer: Self-pay

## 2013-05-05 NOTE — Telephone Encounter (Addendum)
Left message for call back  identifiable  Medication List and allergies:  Reviewed and updated  90 day supply/mail order: [facility] Local prescriptions: [facility] [address]  Immunizations due: UTD  A/P:   No changes to FH, PSH or Personal Hx Flu vaccine--09/2012 Tdap--03/2011 PNA--09/2007 Shingles vaccine--02/2007 CCS--03/2011--Dr Kaplan--nml results--next 2023 PSA--03/2012--0.0  To Discuss with Provider: Jerelyn Scott pain around heart occasionally--stress test? Arthritis

## 2013-05-06 ENCOUNTER — Encounter: Payer: Self-pay | Admitting: Internal Medicine

## 2013-05-06 ENCOUNTER — Ambulatory Visit (INDEPENDENT_AMBULATORY_CARE_PROVIDER_SITE_OTHER): Payer: Medicare Other | Admitting: Internal Medicine

## 2013-05-06 ENCOUNTER — Telehealth: Payer: Self-pay | Admitting: Internal Medicine

## 2013-05-06 VITALS — BP 117/69 | HR 70 | Temp 97.9°F | Ht 64.3 in | Wt 154.0 lb

## 2013-05-06 DIAGNOSIS — Z0000 Encounter for general adult medical examination without abnormal findings: Secondary | ICD-10-CM

## 2013-05-06 DIAGNOSIS — R079 Chest pain, unspecified: Secondary | ICD-10-CM

## 2013-05-06 DIAGNOSIS — K5900 Constipation, unspecified: Secondary | ICD-10-CM

## 2013-05-06 DIAGNOSIS — R1310 Dysphagia, unspecified: Secondary | ICD-10-CM

## 2013-05-06 DIAGNOSIS — I10 Essential (primary) hypertension: Secondary | ICD-10-CM

## 2013-05-06 DIAGNOSIS — E785 Hyperlipidemia, unspecified: Secondary | ICD-10-CM

## 2013-05-06 DIAGNOSIS — M1990 Unspecified osteoarthritis, unspecified site: Secondary | ICD-10-CM

## 2013-05-06 DIAGNOSIS — C61 Malignant neoplasm of prostate: Secondary | ICD-10-CM

## 2013-05-06 LAB — CK: CK TOTAL: 200 U/L (ref 7–232)

## 2013-05-06 LAB — BASIC METABOLIC PANEL
BUN: 14 mg/dL (ref 6–23)
CHLORIDE: 95 meq/L — AB (ref 96–112)
CO2: 32 mEq/L (ref 19–32)
Calcium: 9.3 mg/dL (ref 8.4–10.5)
Creatinine, Ser: 1 mg/dL (ref 0.4–1.5)
GFR: 75.73 mL/min (ref >60.00)
Glucose, Bld: 89 mg/dL (ref 70–99)
Potassium: 3.8 mEq/L (ref 3.5–5.1)
Sodium: 132 mEq/L — ABNORMAL LOW (ref 135–145)

## 2013-05-06 LAB — CBC WITH DIFFERENTIAL/PLATELET
Basophils Absolute: 0 10*3/uL (ref 0.0–0.1)
Basophils Relative: 0.5 % (ref 0.0–3.0)
EOS ABS: 0.2 10*3/uL (ref 0.0–0.7)
Eosinophils Relative: 2.8 % (ref 0.0–5.0)
HCT: 40.1 % (ref 39.0–52.0)
Hemoglobin: 13.6 g/dL (ref 13.0–17.0)
Lymphocytes Relative: 29.7 % (ref 12.0–46.0)
Lymphs Abs: 2 10*3/uL (ref 0.7–4.0)
MCHC: 34 g/dL (ref 30.0–36.0)
MCV: 91.2 fl (ref 78.0–100.0)
MONO ABS: 0.8 10*3/uL (ref 0.1–1.0)
Monocytes Relative: 11.4 % (ref 3.0–12.0)
NEUTROS PCT: 55.6 % (ref 43.0–77.0)
Neutro Abs: 3.8 10*3/uL (ref 1.4–7.7)
Platelets: 280 10*3/uL (ref 150.0–400.0)
RBC: 4.4 Mil/uL (ref 4.22–5.81)
RDW: 13.9 % (ref 11.5–14.6)
WBC: 6.8 10*3/uL (ref 4.5–10.5)

## 2013-05-06 MED ORDER — PREDNISONE 10 MG PO TABS: ORAL_TABLET | ORAL | Status: DC

## 2013-05-06 NOTE — Patient Instructions (Signed)
Get your blood work before you leave   Next visit is for routine check up regards your   blood pressure, aches, swallowing    in 4 months  No need to come back fasting Please make an appointment    Get PREVNAR if available  Take CO Q 10 OTC suplemment to help with aches   Take prednisone as prescribed   Fall Prevention and Home Safety Falls cause injuries and can affect all age groups. It is possible to use preventive measures to significantly decrease the likelihood of falls. There are many simple measures which can make your home safer and prevent falls. OUTDOORS  Repair cracks and edges of walkways and driveways.  Remove high doorway thresholds.  Trim shrubbery on the main path into your home.  Have good outside lighting.  Clear walkways of tools, rocks, debris, and clutter.  Check that handrails are not broken and are securely fastened. Both sides of steps should have handrails.  Have leaves, snow, and ice cleared regularly.  Use sand or salt on walkways during winter months.  In the garage, clean up grease or oil spills. BATHROOM  Install night lights.  Install grab bars by the toilet and in the tub and shower.  Use non-skid mats or decals in the tub or shower.  Place a plastic non-slip stool in the shower to sit on, if needed.  Keep floors dry and clean up all water on the floor immediately.  Remove soap buildup in the tub or shower on a regular basis.  Secure bath mats with non-slip, double-sided rug tape.  Remove throw rugs and tripping hazards from the floors. BEDROOMS  Install night lights.  Make sure a bedside light is easy to reach.  Do not use oversized bedding.  Keep a telephone by your bedside.  Have a firm chair with side arms to use for getting dressed.  Remove throw rugs and tripping hazards from the floor. KITCHEN  Keep handles on pots and pans turned toward the center of the stove. Use back burners when possible.  Clean up spills  quickly and allow time for drying.  Avoid walking on wet floors.  Avoid hot utensils and knives.  Position shelves so they are not too high or low.  Place commonly used objects within easy reach.  If necessary, use a sturdy step stool with a grab bar when reaching.  Keep electrical cables out of the way.  Do not use floor polish or wax that makes floors slippery. If you must use wax, use non-skid floor wax.  Remove throw rugs and tripping hazards from the floor. STAIRWAYS  Never leave objects on stairs.  Place handrails on both sides of stairways and use them. Fix any loose handrails. Make sure handrails on both sides of the stairways are as long as the stairs.  Check carpeting to make sure it is firmly attached along stairs. Make repairs to worn or loose carpet promptly.  Avoid placing throw rugs at the top or bottom of stairways, or properly secure the rug with carpet tape to prevent slippage. Get rid of throw rugs, if possible.  Have an electrician put in a light switch at the top and bottom of the stairs. OTHER FALL PREVENTION TIPS  Wear low-heel or rubber-soled shoes that are supportive and fit well. Wear closed toe shoes.  When using a stepladder, make sure it is fully opened and both spreaders are firmly locked. Do not climb a closed stepladder.  Add color or contrast  paint or tape to grab bars and handrails in your home. Place contrasting color strips on first and last steps.  Learn and use mobility aids as needed. Install an electrical emergency response system.  Turn on lights to avoid dark areas. Replace light bulbs that burn out immediately. Get light switches that glow.  Arrange furniture to create clear pathways. Keep furniture in the same place.  Firmly attach carpet with non-skid or double-sided tape.  Eliminate uneven floor surfaces.  Select a carpet pattern that does not visually hide the edge of steps.  Be aware of all pets. OTHER HOME SAFETY  TIPS  Set the water temperature for 120 F (48.8 C).  Keep emergency numbers on or near the telephone.  Keep smoke detectors on every level of the home and near sleeping areas. Document Released: 01/03/2002 Document Revised: 07/15/2011 Document Reviewed: 04/04/2011 [facility] Patient Information 2014 [place].

## 2013-05-06 NOTE — Progress Notes (Signed)
Subjective:    Patient ID: Michael Johns, male    DOB: 07-15-1932, 80 y.o.   MRN: [id number]  DOS:  05/06/2013 Type of  visit:  Here for Medicare AWV: 1.         Risk factors based on Past M, S, F history: reviewed   2.         Physical Activities: yard work, home chores, Psychologist, occupational,  plays golf! 3.         Depression/mood: neg screen  4.         Hearing: R hear decreased > 40% x years , uses a hearing aid on-off @ the R . No problems @ normal  conversation  ; at baseline  5.         ADL's: independent  , drives 6.         Fall Risk: no recent, prevention discussed    7.         Home Safety: does feels safe at home   8.         Height, weight, &visual acuity: see VS, sees eye doctor yearly, wear glasses   9.         Counseling: yes   10.       Labs ordered based on risk factors: yes 11.      Referral Coordination, if needed 12.           Care Plan, see assessment and plan 13.            Cognitive Assessment: cognition & motor skills seem appropriate. Good memory  in addition, we discussed the following 2 weeks history of swelling tenderness at  the left thumb, mostly at the bases. No injury. Long history of occ left chest dull ache, last 5-6 seconds, at rest , no rash, no change with deep inspirations. Also having back -feet, cramps mostly at night. Thinks symptoms may be related to Lipitor. Hypertension good medication compliance, ambulatory BPs within normal. Dysphagia--- still having issues, see assessment and plan  ROS   No fever, chills, rash, myalgias, arthralgias No  SOB. No palpitations, no lower extremity edema  Denies  nausea, vomiting diarrhea Denies  blood in the stools (-) cough, sputum production (-) wheezing, chest congestion  occ bladder leak since surgery; no dysuria, gross hematuria        Past Medical History  Diagnosis Date  . HYPERTENSION 05/19/2006  . ADENOCARCINOMA, PROSTATE 07/01/2006  . [place], MILD 07/18/2009  . PROSTATECTOMY, HX OF 07/01/2006    . Gout     see 09-2008 note  . 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 Abdominal pain 11/09    due to constipation and ?pancreatitis- admitted    Past Surgical History  Procedure Laterality Date  . Appendectomy    . Tonsillectomy    . Prostatectomy  05/2007    LADS (-) Bone scan (-)  . Inguinal hernia repair  11/2007    right  . Rt middle ear surgery    . Colonoscopy    . Polypectomy    . Sinus polyp removed    . Cataracts  2014    B     History   Social History  . Marital Status: Married    Spouse Name: N/A    Number of Children: 0  . Years of Education: N/A   Occupational History  . retired     Social History Main Topics  . Smoking  status: Never Smoker   . Smokeless tobacco: Never Used  . Alcohol Use: Yes     Comment: socially   . Drug Use: No  . Sexual Activity: Not on file   Other Topics Concern  . Not on file   Social History Narrative   Re-married, no children, Joycelyn Schmid , present wife has 3 daughters      retired-- worked for the [employer] History  Problem Relation Age of Onset  . Hypertension Mother   . Diabetes Mother   . Heart disease Mother     CABG 59 y/o  . Esophageal cancer Father   . Heart disease Maternal Grandmother     MI  . Prostate cancer Neg Hx   . Colon cancer Neg Hx   . Rectal cancer Neg Hx   . Stomach cancer Neg Hx        Medication List       This list is accurate as of: 05/06/13  5:44 PM.  Always use your most recent med list.               amLODipine 5 MG tablet  Commonly known as:  NORVASC  Take 1 tablet by mouth  daily     aspirin 81 MG tablet  Take 81 mg by mouth daily.     atorvastatin 10 MG tablet  Commonly known as:  LIPITOR  Take 1 tablet by mouth  daily     hydrochlorothiazide 12.5 MG tablet  Commonly known as:  HYDRODIURIL  Take 1 tablet by mouth  daily     metoprolol succinate 25 MG 24 hr tablet  Commonly known as:  TOPROL-XL  Take 1 tablet by mouth  daily     multivitamin  tablet  Take 1 tablet by mouth daily.     omeprazole 20 MG capsule  Commonly known as:  PRILOSEC  Take 1 capsule daily. DUE  For physical [phone] please schedule for additional refills .     polyethylene glycol packet  Commonly known as:  MIRALAX / GLYCOLAX  Take 17 g by mouth as needed.     predniSONE 10 MG tablet  Commonly known as:  DELTASONE  3 tabs x 3 days, 2 tabs x 3 days, 1 tab x 3 days     senna 8.6 MG tablet  Commonly known as:  SENOKOT  Take 1 tablet by mouth daily. Prn           Objective:   Physical Exam BP 117/69  Pulse 70  Temp(Src) 97.9 F (36.6 C)  Ht 5' 4.3" (1.633 m)  Wt 154 lb (69.854 kg)  BMI 26.20 kg/m2  SpO2 99% General -- alert, well-developed, NAD.  Neck --no thyromegaly ,   no LAD Lungs -- normal respiratory effort, no intercostal retractions, no accessory muscle use, and normal breath sounds.  Heart-- normal rate, regular rhythm, no murmur.  Abdomen-- Not distended, good bowel sounds,soft, non-tender. Extremities-- no pretibial edema bilaterally  Left hand normal. Right hand slightly puffy and tender at the base of the thumb, palmar aspect. Thumb PIP also slt tender  Neurologic--  alert & oriented X3. Speech normal, gait normal, strength normal in all extremities.  Psych-- Cognition and judgment appear intact. Cooperative with normal attention span and concentration. No anxious or depressed appearing.       Assessment & Plan:

## 2013-05-06 NOTE — Assessment & Plan Note (Signed)
Check a PSA 

## 2013-05-06 NOTE — Assessment & Plan Note (Signed)
Pain likely DJD. Plan: Low dose prednisone, Tylenol. If not better will need a referral for possible local injections , x-rays etc

## 2013-05-06 NOTE — Progress Notes (Signed)
Pre visit review using our clinic review tool, if applicable. No additional management support is needed unless otherwise documented below in the visit note. 

## 2013-05-06 NOTE — Assessment & Plan Note (Addendum)
Td 2013 pneumonia shot 2009, recommend prevnar shingles shot 2-09  Cscope 10-08, Adenomatous polyp, repeated  cscope again  03-2011  Normal Diet and exercise discussed. He is doing well Having atypical chest pain, EKG today nsr, rec observation

## 2013-05-06 NOTE — Assessment & Plan Note (Signed)
Cholesterol well-controlled on Lipitor, has cramps. Plan:  CoQ10 Check--- CKs and vitamin D.

## 2013-05-06 NOTE — Telephone Encounter (Signed)
Relevant patient education assigned to patient using Emmi. ° °

## 2013-05-06 NOTE — Assessment & Plan Note (Signed)
Well-controlled, no change, labs 

## 2013-05-06 NOTE — Assessment & Plan Note (Addendum)
"  For the last few years when I swallow doesn't feel the same but I don't have any difficulty swallowing" Symptoms are, chronic, at baseline. Plan: Consistent  use of Prilosec, reasses in 4 months, GI? Barium study?

## 2013-05-07 LAB — VITAMIN D 25 HYDROXY (VIT D DEFICIENCY, FRACTURES): VIT D 25 HYDROXY: 49 ng/mL (ref 30–89)

## 2013-05-16 ENCOUNTER — Other Ambulatory Visit: Payer: Self-pay | Admitting: Internal Medicine

## 2013-05-16 NOTE — Telephone Encounter (Signed)
Rx sent to the pharmacy by e-script.//AB/CMA 

## 2013-05-18 ENCOUNTER — Encounter: Payer: Self-pay | Admitting: Internal Medicine

## 2013-05-25 ENCOUNTER — Encounter: Payer: Medicare Other | Admitting: Internal Medicine

## 2013-06-07 ENCOUNTER — Encounter: Payer: Self-pay | Admitting: Internal Medicine

## 2013-07-02 ENCOUNTER — Other Ambulatory Visit: Payer: Self-pay | Admitting: Internal Medicine

## 2013-08-15 ENCOUNTER — Telehealth: Payer: Self-pay | Admitting: *Deleted

## 2013-08-15 MED ORDER — METOPROLOL SUCCINATE ER 25 MG PO TB24: ORAL_TABLET | ORAL | Status: DC

## 2013-08-15 NOTE — Telephone Encounter (Signed)
Caller name:  Ron Relation to pt:  self Call back number: [phone]  Pharmacy:  [facility]  Reason for call:  Pt stopped by office.  He is out of metoprolol succinate (TOPROL-XL) 25 MG 24 hr tablet, which he normally gets through [facility].  They told him it would be about 4 days for him to get his prescription in the mail.  Can we send a prescription to [facility] for 5 days worth?  Please advise pt.  bw

## 2013-08-15 NOTE — Telephone Encounter (Signed)
Done . rx sent  

## 2013-09-02 ENCOUNTER — Encounter: Payer: Self-pay | Admitting: Internal Medicine

## 2013-09-07 ENCOUNTER — Encounter: Payer: Self-pay | Admitting: Internal Medicine

## 2013-09-07 ENCOUNTER — Ambulatory Visit (INDEPENDENT_AMBULATORY_CARE_PROVIDER_SITE_OTHER): Payer: Medicare Other | Admitting: Internal Medicine

## 2013-09-07 VITALS — BP 108/66 | HR 60 | Temp 97.9°F | Wt 155.0 lb

## 2013-09-07 DIAGNOSIS — R1310 Dysphagia, unspecified: Secondary | ICD-10-CM

## 2013-09-07 DIAGNOSIS — Z23 Encounter for immunization: Secondary | ICD-10-CM

## 2013-09-07 DIAGNOSIS — R072 Precordial pain: Secondary | ICD-10-CM

## 2013-09-07 DIAGNOSIS — E785 Hyperlipidemia, unspecified: Secondary | ICD-10-CM

## 2013-09-07 NOTE — Patient Instructions (Signed)
Restart Lipitor at bedtime, watch for side effects Next visit in 4 months, fasting Call anytime if the chest pain is different, increases, severe

## 2013-09-07 NOTE — Assessment & Plan Note (Addendum)
See previous entry, he started Prilosec, better?Marland Kitchen Symptoms are vague  and not really much better with Prilosec. Refer to GI. EGD? UGI?

## 2013-09-07 NOTE — Progress Notes (Signed)
Subjective:    Patient ID: Michael Johns, male    DOB: [birth year], 80 y.o.   MRN: [id number]  DOS:  09/07/2013 Type of visit - description: ROV History: Dysphagia, see previous visit, he started  Prilosec, helped a little? Patient not sure. Denies classic heartburn High cholesterol, developed cramps, he held lipitor  And started CoQ10, he was supposed to restart Lipitor however he didn't. He is cramp-free at this time  Also for the last 2 weeks he is experiencing on and off chest pressure, last few minutes, located at the left mid and upper chest pain that is usually after eating a meal, usually a big meal. No radiation, no associated nausea, sweats, difficulty breathing. He is able to take his usual  45-minute long walks without any symptoms.  ROS See HPI  Past Medical History  Diagnosis Date  . HYPERTENSION 05/19/2006  . ADENOCARCINOMA, PROSTATE 07/01/2006  . [place], MILD 07/18/2009  . PROSTATECTOMY, HX OF 07/01/2006  . Gout     see 09-2008 note  . 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 Abdominal pain 11/09    due to constipation and ?pancreatitis- admitted    Past Surgical History  Procedure Laterality Date  . Appendectomy    . Tonsillectomy    . Prostatectomy  05/2007    LADS (-) Bone scan (-)  . Inguinal hernia repair  11/2007    right  . Rt middle ear surgery    . Colonoscopy    . Polypectomy    . Sinus polyp removed    . Cataracts  2014    B     History   Social History  . Marital Status: Married    Spouse Name: N/A    Number of Children: 0  . Years of Education: N/A   Occupational History  . retired     Social History Main Topics  . Smoking status: Never Smoker   . Smokeless tobacco: Never Used  . Alcohol Use: Yes     Comment: socially   . Drug Use: No  . Sexual Activity: Not on file   Other Topics Concern  . Not on file   Social History Narrative   Re-married, no children, Joycelyn Schmid , present wife has 3 daughters      retired--  worked for the [employer]        Medication List       This list is accurate as of: 09/07/13  8:59 PM.  Always use your most recent med list.               amLODipine 5 MG tablet  Commonly known as:  NORVASC  Take 1 tablet by mouth  daily     aspirin 81 MG tablet  Take 81 mg by mouth daily.     atorvastatin 10 MG tablet  Commonly known as:  LIPITOR  Take 1 tablet by mouth  daily     hydrochlorothiazide 12.5 MG tablet  Commonly known as:  HYDRODIURIL  Take 1 tablet by mouth  daily     metoprolol succinate 25 MG 24 hr tablet  Commonly known as:  TOPROL-XL  Take 1 tablet by mouth  daily     multivitamin tablet  Take 1 tablet by mouth daily.     omeprazole 20 MG capsule  Commonly known as:  PRILOSEC  Take 1 capsule daily     polyethylene glycol packet  Commonly known as:  MIRALAX / GLYCOLAX  Take 17 g by mouth as needed.     senna 8.6 MG tablet  Commonly known as:  SENOKOT  Take 1 tablet by mouth daily. Prn           Objective:   Physical Exam BP 108/66  Pulse 60  Temp(Src) 97.9 F (36.6 C)  Wt 155 lb (70.308 kg)  SpO2 98% General -- alert, well-developed, NAD.  Lungs -- normal respiratory effort, no intercostal retractions, no accessory muscle use, and normal breath sounds.  Chest wall, no TTP Heart-- normal rate, regular rhythm, no murmur.  Abdomen-- Not distended, good bowel sounds,soft, non-tender. Extremities-- no pretibial edema bilaterally  Neurologic--  alert & oriented X3. Speech normal, gait appropriate for age, strength symmetric and appropriate for age.  Psych-- Cognition and judgment appear intact. Cooperative with normal attention span and concentration. No anxious or depressed appearing.        Assessment & Plan:   Chest pain,  symptoms are atypical, not exertional, patient is not a diabetic. He does have issues with dysphagia. EKG today nsr And unchanged from previous Plan: Observation , patient to call if symptoms increase, different  or exertional

## 2013-09-07 NOTE — Progress Notes (Signed)
Pre visit review using our clinic review tool, if applicable. No additional management support is needed unless otherwise documented below in the visit note. 

## 2013-09-07 NOTE — Assessment & Plan Note (Signed)
Developed leg cramps, symptoms got better after he stopped Lipitor and start CoQ10, has not restart the Lipitor yet. Plan: Restart Lipitor at bedtime, continue CoQ10, watch for symptoms

## 2013-09-08 ENCOUNTER — Encounter: Payer: Self-pay | Admitting: Gastroenterology

## 2013-09-26 ENCOUNTER — Other Ambulatory Visit: Payer: Self-pay | Admitting: Internal Medicine

## 2013-10-19 ENCOUNTER — Ambulatory Visit (INDEPENDENT_AMBULATORY_CARE_PROVIDER_SITE_OTHER): Payer: Medicare Other

## 2013-10-19 DIAGNOSIS — Z23 Encounter for immunization: Secondary | ICD-10-CM

## 2013-11-14 ENCOUNTER — Ambulatory Visit: Payer: Medicare Other | Admitting: Gastroenterology

## 2013-11-30 ENCOUNTER — Other Ambulatory Visit: Payer: Self-pay | Admitting: Internal Medicine

## 2013-12-30 ENCOUNTER — Other Ambulatory Visit: Payer: Self-pay | Admitting: Internal Medicine

## 2014-01-04 ENCOUNTER — Ambulatory Visit (INDEPENDENT_AMBULATORY_CARE_PROVIDER_SITE_OTHER): Payer: Medicare Other | Admitting: Internal Medicine

## 2014-01-04 ENCOUNTER — Encounter: Payer: Self-pay | Admitting: Internal Medicine

## 2014-01-04 VITALS — BP 119/67 | HR 58 | Temp 97.6°F | Wt 153.4 lb

## 2014-01-04 DIAGNOSIS — M150 Primary generalized (osteo)arthritis: Secondary | ICD-10-CM

## 2014-01-04 DIAGNOSIS — R1310 Dysphagia, unspecified: Secondary | ICD-10-CM

## 2014-01-04 DIAGNOSIS — E785 Hyperlipidemia, unspecified: Secondary | ICD-10-CM

## 2014-01-04 DIAGNOSIS — M159 Polyosteoarthritis, unspecified: Secondary | ICD-10-CM

## 2014-01-04 MED ORDER — PREDNISONE 10 MG PO TABS: ORAL_TABLET | ORAL | Status: DC

## 2014-01-04 NOTE — Assessment & Plan Note (Signed)
DJD, Pain at the base of the left thumb ? DJD flare , ? gout. Plan: Prednisone, if symptoms recur will send to Ortho

## 2014-01-04 NOTE — Progress Notes (Signed)
Subjective:    Patient ID: Michael Johns, male    DOB: 05-Sep-1932, 80 y.o.   MRN: [id number]  DOS:  01/04/2014 Type of visit - description : acute Interval history: .developed pain and swelling at the base of the left thumb 10 days ago. Denies any injury. Also, continue with leg cramps, again thinks related to Crestor.   ROS Denies fever or chills. No hand redness or warmness. Was seen recently with chest pain and  dysphagia, both symptoms resolved When asked, he remembers being diagnosed with gout one time several years ago.  Past Medical History  Diagnosis Date  . HYPERTENSION 05/19/2006  . ADENOCARCINOMA, PROSTATE 07/01/2006  . [place], MILD 07/18/2009  . PROSTATECTOMY, HX OF 07/01/2006  . Gout     see 09-2008 note  . 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 Abdominal pain 11/09    due to constipation and ?pancreatitis- admitted    Past Surgical History  Procedure Laterality Date  . Appendectomy    . Tonsillectomy    . Prostatectomy  05/2007    LADS (-) Bone scan (-)  . Inguinal hernia repair  11/2007    right  . Rt middle ear surgery    . Colonoscopy    . Polypectomy    . Sinus polyp removed    . Cataracts  2014    B     History   Social History  . Marital Status: Married    Spouse Name: N/A    Number of Children: 0  . Years of Education: N/A   Occupational History  . retired     Social History Main Topics  . Smoking status: Never Smoker   . Smokeless tobacco: Never Used  . Alcohol Use: Yes     Comment: socially   . Drug Use: No  . Sexual Activity: Not on file   Other Topics Concern  . Not on file   Social History Narrative   Re-married, no children, Joycelyn Schmid , present wife has 3 daughters      retired-- worked for the [employer]        Medication List       This list is accurate as of: 01/04/14  6:16 PM.  Always use your most recent med list.               amLODipine 5 MG tablet  Commonly known as:  NORVASC  Take 1 tablet by  mouth  daily     aspirin 81 MG tablet  Take 81 mg by mouth daily.     hydrochlorothiazide 12.5 MG tablet  Commonly known as:  HYDRODIURIL  Take 1 tablet by mouth  daily     metoprolol succinate 25 MG 24 hr tablet  Commonly known as:  TOPROL-XL  Take 1 tablet by mouth  daily     multivitamin tablet  Take 1 tablet by mouth daily.     omeprazole 20 MG capsule  Commonly known as:  PRILOSEC  Take 1 capsule by mouth  daily     polyethylene glycol packet  Commonly known as:  MIRALAX / GLYCOLAX  Take 17 g by mouth as needed.     predniSONE 10 MG tablet  Commonly known as:  DELTASONE  4 tablets x 2 days, 3 tabs x 2 days, 2 tabs x 2 days, 1 tab x 2 days     senna 8.6 MG tablet  Commonly known as:  SENOKOT  Take 1 tablet  by mouth daily. Prn           Objective:   Physical Exam BP 119/67 mmHg  Pulse 58  Temp(Src) 97.6 F (36.4 C) (Oral)  Wt 153 lb 6 oz (69.57 kg)  SpO2 98% General -- alert, well-developed, NAD.  Extremities--  Right hand and wrist normal Left hand and wrist normal, symmetric except for the base of the left thumb which is slightly tender, mild swelling?Marland Kitchen Not warm, not red, range of motion is symmetric. Neurologic--  alert & oriented X3. Speech normal, gait appropriate for age, strength symmetric and appropriate for age.  Psych-- Cognition and judgment appear intact. Cooperative with normal attention span and concentration. No anxious or depressed appearing.        Assessment & Plan:   C/o chest pain, see last OV: Resolved

## 2014-01-04 NOTE — Progress Notes (Signed)
Pre visit review using our clinic review tool, if applicable. No additional management support is needed unless otherwise documented below in the visit note. 

## 2014-01-04 NOTE — Assessment & Plan Note (Signed)
Dysphagia Was referred to GI, he got better, now asymptomatic, he canceled the GI referral

## 2014-01-04 NOTE — Assessment & Plan Note (Signed)
Hyperlipidemia Definitely intolerant to Lipitor Plan: Discontinue Lipitor and CoQ10, come back in 4-6 weeks fasting ;consider restart a different statin. Diet and exercise encouraged

## 2014-01-04 NOTE — Patient Instructions (Signed)
Stop Lipitor and CoQ10 Come back in 4-6 weeks for a checkup, fasting  Take prednisone as prescribed for few days If needed take Tylenol for pain If not improving in the next few days please let me know

## 2014-01-09 ENCOUNTER — Encounter: Payer: Self-pay | Admitting: Internal Medicine

## 2014-01-11 ENCOUNTER — Ambulatory Visit: Payer: Medicare Other | Admitting: Internal Medicine

## 2014-01-12 ENCOUNTER — Other Ambulatory Visit: Payer: Self-pay

## 2014-02-03 ENCOUNTER — Encounter: Payer: Self-pay | Admitting: Internal Medicine

## 2014-02-03 ENCOUNTER — Ambulatory Visit (INDEPENDENT_AMBULATORY_CARE_PROVIDER_SITE_OTHER): Payer: Medicare Other | Admitting: Internal Medicine

## 2014-02-03 VITALS — BP 125/69 | HR 70 | Temp 98.1°F | Ht 64.0 in | Wt 148.5 lb

## 2014-02-03 DIAGNOSIS — I10 Essential (primary) hypertension: Secondary | ICD-10-CM

## 2014-02-03 DIAGNOSIS — E785 Hyperlipidemia, unspecified: Secondary | ICD-10-CM

## 2014-02-03 DIAGNOSIS — R1310 Dysphagia, unspecified: Secondary | ICD-10-CM

## 2014-02-03 DIAGNOSIS — J069 Acute upper respiratory infection, unspecified: Secondary | ICD-10-CM

## 2014-02-03 LAB — BASIC METABOLIC PANEL
BUN: 14 mg/dL (ref 6–23)
CALCIUM: 9.3 mg/dL (ref 8.4–10.5)
CHLORIDE: 97 meq/L (ref 96–112)
CO2: 30 mEq/L (ref 19–32)
Creatinine, Ser: 1.1 mg/dL (ref 0.4–1.5)
GFR: 66.4 mL/min (ref >60.00)
Glucose, Bld: 99 mg/dL (ref 70–99)
Potassium: 4.1 mEq/L (ref 3.5–5.1)
Sodium: 133 mEq/L — ABNORMAL LOW (ref 135–145)

## 2014-02-03 LAB — LIPID PANEL
CHOL/HDL RATIO: 6
Cholesterol: 225 mg/dL — ABNORMAL HIGH (ref 0–200)
HDL: 35.5 mg/dL — AB (ref >39.00)
LDL CALC: 152 mg/dL — AB (ref 0–99)
NONHDL: 189.5
Triglycerides: 188 mg/dL — ABNORMAL HIGH (ref 0.0–149.0)
VLDL: 37.6 mg/dL (ref 0.0–40.0)

## 2014-02-03 NOTE — Assessment & Plan Note (Signed)
Well controlled on Toprol, HCTZ. Amlodipine. Labs

## 2014-02-03 NOTE — Progress Notes (Signed)
Pre visit review using our clinic review tool, if applicable. No additional management support is needed unless otherwise documented below in the visit note. 

## 2014-02-03 NOTE — Assessment & Plan Note (Signed)
On PPIs, states that he still has  symptoms sometimes, plan to see GI next month

## 2014-02-03 NOTE — Assessment & Plan Note (Signed)
Has improved his lifestyle significantly, has lost some weight. We'll check a cholesterol panel

## 2014-02-03 NOTE — Progress Notes (Signed)
Subjective:    Patient ID: Michael Johns, male    DOB: May 20, 1932, 81 y.o.   MRN: [id number]  DOS:  02/03/2014 Type of visit - description : Routine checkup Interval history: Hyperlipidemia, has definitely improved his lifestyle loosing weight. Due for labs One-week history of dry cough, hoarseness. He does not feel bad, he walked 2 miles yesterday. The cough is slightly improved today. DJD, see previous note, has pain @ the  thumb on and off at this time.    ROS Denies fever, chills. Some sore throat, no ear discharge No difficulty breathing or lower extremity edema No wheezing  Past Medical History  Diagnosis Date  . HYPERTENSION 05/19/2006  . ADENOCARCINOMA, PROSTATE 07/01/2006  . [place], MILD 07/18/2009  . PROSTATECTOMY, HX OF 07/01/2006  . Gout     see 09-2008 note  . 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 Abdominal pain 11/09    due to constipation and ?pancreatitis- admitted    Past Surgical History  Procedure Laterality Date  . Appendectomy    . Tonsillectomy    . Prostatectomy  05/2007    LADS (-) Bone scan (-)  . Inguinal hernia repair  11/2007    right  . Rt middle ear surgery    . Colonoscopy    . Polypectomy    . Sinus polyp removed    . Cataracts  2014    B     History   Social History  . Marital Status: Married    Spouse Name: N/A    Number of Children: 0  . Years of Education: N/A   Occupational History  . retired     Social History Main Topics  . Smoking status: Never Smoker   . Smokeless tobacco: Never Used  . Alcohol Use: Yes     Comment: socially   . Drug Use: No  . Sexual Activity: Not on file   Other Topics Concern  . Not on file   Social History Narrative   Re-married, no children, Joycelyn Schmid , present wife has 3 daughters      retired-- worked for the [employer]        Medication List       This list is accurate as of: 02/03/14  6:13 PM.  Always use your most recent med list.               amLODipine 5 MG tablet   Commonly known as:  NORVASC  Take 1 tablet by mouth  daily     aspirin 81 MG tablet  Take 81 mg by mouth daily.     FISH OIL PO  Take 1 tablet by mouth every other day.     hydrochlorothiazide 12.5 MG tablet  Commonly known as:  HYDRODIURIL  Take 1 tablet by mouth  daily     metoprolol succinate 25 MG 24 hr tablet  Commonly known as:  TOPROL-XL  Take 1 tablet by mouth  daily     multivitamin tablet  Take 1 tablet by mouth daily.     omeprazole 20 MG capsule  Commonly known as:  PRILOSEC  Take 1 capsule by mouth  daily     polyethylene glycol packet  Commonly known as:  MIRALAX / GLYCOLAX  Take 17 g by mouth as needed.     senna 8.6 MG tablet  Commonly known as:  SENOKOT  Take 1 tablet by mouth daily. As needed  Objective:   Physical Exam BP 125/69 mmHg  Pulse 70  Temp(Src) 98.1 F (36.7 C) (Oral)  Ht 5\' 4"  (1.626 m)  Wt 148 lb 8 oz (67.359 kg)  BMI 25.48 kg/m2  SpO2 98% General -- alert, well-developed, NAD.   HEENT-- Not pale.  R Ear-- no d/c redness  L ear-- normal Throat symmetric, no redness or discharge. Face symmetric, sinuses not tender to palpation. Nose slt congested. Lungs -- normal respiratory effort, no intercostal retractions, no accessory muscle use, and normal breath sounds.  Heart-- normal rate, regular rhythm, no murmur.  Extremities-- no pretibial edema bilaterally  Neurologic--  alert & oriented X3. Speech normal, gait appropriate for age, strength symmetric and appropriate for age.  Psych-- Cognition and judgment appear intact. Cooperative with normal attention span and concentration. No anxious or depressed appearing.        Assessment & Plan:  URI, recommend conservative treatment, see instructions

## 2014-02-03 NOTE — Patient Instructions (Signed)
Get your blood work before you leave   Rest, fluids , tylenol If  cough, take Mucinex DM twice a day as needed  If nasal  congestion use OTC Nasocort or Flonase : 2 nasal sprays on each side of the nose daily until you feel better  Call if not gradually better over the next  10 days Call anytime if the symptoms are severe  Please come back to the office by 04-2014 for a physical exam. Come back fasting

## 2014-02-05 ENCOUNTER — Encounter: Payer: Self-pay | Admitting: Internal Medicine

## 2014-03-04 ENCOUNTER — Other Ambulatory Visit: Payer: Self-pay | Admitting: Internal Medicine

## 2014-03-06 ENCOUNTER — Other Ambulatory Visit: Payer: Self-pay

## 2014-03-10 ENCOUNTER — Ambulatory Visit (INDEPENDENT_AMBULATORY_CARE_PROVIDER_SITE_OTHER): Payer: Medicare Other | Admitting: Gastroenterology

## 2014-03-10 ENCOUNTER — Encounter: Payer: Self-pay | Admitting: Gastroenterology

## 2014-03-10 VITALS — BP 120/70 | HR 68 | Ht 63.0 in | Wt 153.2 lb

## 2014-03-10 DIAGNOSIS — R1310 Dysphagia, unspecified: Secondary | ICD-10-CM

## 2014-03-10 NOTE — Patient Instructions (Signed)

## 2014-03-10 NOTE — Progress Notes (Signed)
_                                                                                                                History of Present Illness:  [name] is a pleasant 81 yo male with history of prostate CA referred for evaluation of dysphagia.  He has developed gradual onset of dysphagia to solids.  He denies odynophagia or pyrosis.  He takes Prilosec for GERD.  Weight has been stable.   Past Medical History  Diagnosis Date  . HYPERTENSION 05/19/2006  . ADENOCARCINOMA, PROSTATE 07/01/2006  . [place], MILD 07/18/2009  . PROSTATECTOMY, HX OF 07/01/2006  . Gout     see 09-2008 note  . 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 Abdominal pain 11/09    due to constipation and ?pancreatitis- admitted  . GERD (gastroesophageal reflux disease)    Past Surgical History  Procedure Laterality Date  . Appendectomy    . Tonsillectomy    . Prostatectomy  05/2007    LADS (-) Bone scan (-)  . Inguinal hernia repair Right 11/2007  . Rt middle ear surgery    . Colonoscopy    . Polypectomy    . Sinus polyp removed    . Cataracts Bilateral 2014   family history includes Cancer in his other; Diabetes in his mother; Esophageal cancer in his father; Heart disease in his maternal grandmother and mother; Hypertension in his mother; Leukemia in his maternal grandfather. There is no history of Prostate cancer, Colon cancer, Rectal cancer, or Stomach cancer. Current Outpatient Prescriptions  Medication Sig Dispense Refill  . amLODipine (NORVASC) 5 MG tablet Take 1 tablet by mouth  daily 90 tablet 2  . aspirin 81 MG tablet Take 81 mg by mouth daily.     Marland Kitchen atorvastatin (LIPITOR) 10 MG tablet Take 10 mg by mouth daily.    . Coenzyme Q10 (CO Q-10) 100 MG CAPS Take 1 capsule by mouth daily.    . hydrochlorothiazide (HYDRODIURIL) 12.5 MG tablet Take 1 tablet by mouth  daily 90 tablet 2  . metoprolol succinate (TOPROL-XL) 25 MG 24 hr tablet Take 1 tablet by mouth  daily 90 tablet 2  .  Multiple Vitamin (MULTIVITAMIN) tablet Take 1 tablet by mouth daily.      . Omega-3 Fatty Acids (FISH OIL PO) Take 1 tablet by mouth every other day.    Marland Kitchen omeprazole (PRILOSEC) 20 MG capsule Take 1 capsule by mouth  daily 90 capsule 1  . polyethylene glycol (MIRALAX / GLYCOLAX) packet Take 17 g by mouth as needed.      . senna (SENOKOT) 8.6 MG tablet Take 1 tablet by mouth as needed. As needed     No current facility-administered medications for this visit.   Allergies as of 03/10/2014 - Review Complete 03/10/2014  Allergen Reaction Noted  . Cephalexin Hives 10/15/2007    reports that he has never smoked. He has never used smokeless tobacco. He reports that he drinks alcohol.  He reports that he does not use illicit drugs.   Review of Systems: Pertinent positive and negative review of systems were noted in the above HPI section. All other review of systems were otherwise negative.  Vital signs were reviewed in today's medical record Physical Exam: General: Well developed , well nourished, no acute distress Skin: anicteric Head: Normocephalic and atraumatic Eyes:  sclerae anicteric, EOMI Ears: Normal auditory acuity Mouth: No deformity or lesions Neck: Supple, no masses or thyromegaly Lungs: Clear throughout to auscultation Heart: Regular rate and rhythm; no murmurs, rubs or bruits Abdomen: Soft, non tender and non distended. No masses, hepatosplenomegaly or hernias noted. Normal Bowel sounds Rectal:deferred Musculoskeletal: Symmetrical with no gross deformities  Skin: No lesions on visible extremities Pulses:  Normal pulses noted Extremities: No clubbing, cyanosis, edema or deformities noted Neurological: Alert oriented x 4, grossly nonfocal Cervical Nodes:  No significant cervical adenopathy Inguinal Nodes: No significant inguinal adenopathy Psychological:  Alert and cooperative. Normal mood and affect  See Assessment and Plan under Problem List

## 2014-03-10 NOTE — Assessment & Plan Note (Signed)
  New onset of dysphagia to solids.  Suspect peptic stricture versus possible mild motility disorder.  Recommendations #1 upper endoscopy with dilation as indicated

## 2014-03-13 ENCOUNTER — Encounter: Payer: Medicare Other | Admitting: Gastroenterology

## 2014-03-20 ENCOUNTER — Encounter: Payer: Medicare Other | Admitting: Gastroenterology

## 2014-03-24 ENCOUNTER — Other Ambulatory Visit: Payer: Self-pay | Admitting: Internal Medicine

## 2014-04-19 ENCOUNTER — Telehealth: Payer: Self-pay | Admitting: Internal Medicine

## 2014-04-19 NOTE — Telephone Encounter (Signed)
pre visit letter sent °

## 2014-05-09 ENCOUNTER — Telehealth: Payer: Self-pay | Admitting: *Deleted

## 2014-05-09 ENCOUNTER — Encounter: Payer: Self-pay | Admitting: *Deleted

## 2014-05-09 NOTE — Telephone Encounter (Signed)
Pre-Visit Call completed with patient and chart updated.   Pre-Visit Info documented in Specialty Comments under [employer].    

## 2014-05-10 ENCOUNTER — Ambulatory Visit (INDEPENDENT_AMBULATORY_CARE_PROVIDER_SITE_OTHER): Payer: Medicare Other | Admitting: Internal Medicine

## 2014-05-10 ENCOUNTER — Encounter: Payer: Self-pay | Admitting: Internal Medicine

## 2014-05-10 VITALS — BP 124/68 | HR 57 | Temp 98.0°F | Ht 63.0 in | Wt 152.0 lb

## 2014-05-10 DIAGNOSIS — C61 Malignant neoplasm of prostate: Secondary | ICD-10-CM | POA: Diagnosis not present

## 2014-05-10 DIAGNOSIS — I10 Essential (primary) hypertension: Secondary | ICD-10-CM | POA: Diagnosis not present

## 2014-05-10 DIAGNOSIS — Z0000 Encounter for general adult medical examination without abnormal findings: Secondary | ICD-10-CM

## 2014-05-10 DIAGNOSIS — E785 Hyperlipidemia, unspecified: Secondary | ICD-10-CM

## 2014-05-10 LAB — CBC WITH DIFFERENTIAL/PLATELET
BASOS ABS: 0 10*3/uL (ref 0.0–0.1)
Basophils Relative: 0.5 % (ref 0.0–3.0)
EOS ABS: 0.4 10*3/uL (ref 0.0–0.7)
Eosinophils Relative: 5.2 % — ABNORMAL HIGH (ref 0.0–5.0)
HEMATOCRIT: 40.8 % (ref 39.0–52.0)
HEMOGLOBIN: 14 g/dL (ref 13.0–17.0)
Lymphocytes Relative: 28.5 % (ref 12.0–46.0)
Lymphs Abs: 2.2 10*3/uL (ref 0.7–4.0)
MCHC: 34.3 g/dL (ref 30.0–36.0)
MCV: 89.1 fl (ref 78.0–100.0)
Monocytes Absolute: 0.9 10*3/uL (ref 0.1–1.0)
Monocytes Relative: 11.3 % (ref 3.0–12.0)
Neutro Abs: 4.2 10*3/uL (ref 1.4–7.7)
Neutrophils Relative %: 54.5 % (ref 43.0–77.0)
Platelets: 275 10*3/uL (ref 150.0–400.0)
RBC: 4.58 Mil/uL (ref 4.22–5.81)
RDW: 13.9 % (ref 11.5–15.5)
WBC: 7.8 10*3/uL (ref 4.0–10.5)

## 2014-05-10 LAB — LIPID PANEL
CHOL/HDL RATIO: 3
Cholesterol: 166 mg/dL (ref 0–200)
HDL: 59.5 mg/dL (ref >39.00)
LDL CALC: 87 mg/dL (ref 0–99)
NONHDL: 106.5
TRIGLYCERIDES: 99 mg/dL (ref 0.0–149.0)
VLDL: 19.8 mg/dL (ref 0.0–40.0)

## 2014-05-10 LAB — AST: AST: 26 U/L (ref 0–37)

## 2014-05-10 LAB — PSA: PSA: 0.01 ng/mL — ABNORMAL LOW (ref 0.10–4.00)

## 2014-05-10 LAB — ALT: ALT: 18 U/L (ref 0–53)

## 2014-05-10 NOTE — Progress Notes (Signed)
Pre visit review using our clinic review tool, if applicable. No additional management support is needed unless otherwise documented below in the visit note. 

## 2014-05-10 NOTE — Patient Instructions (Signed)
Get your blood work before you leave   Come back to the office in 6 months for a routine check up   no fasting       Fall Prevention and [place] cause injuries and can affect all age groups. It is possible to use preventive measures to significantly decrease the likelihood of falls. There are many simple measures which can make your home safer and prevent falls. OUTDOORS  Repair cracks and edges of walkways and driveways.  Remove high doorway thresholds.  Trim shrubbery on the main path into your home.  Have good outside lighting.  Clear walkways of tools, rocks, debris, and clutter.  Check that handrails are not broken and are securely fastened. Both sides of steps should have handrails.  Have leaves, snow, and ice cleared regularly.  Use sand or salt on walkways during winter months.  In the garage, clean up grease or oil spills. BATHROOM  Install night lights.  Install grab bars by the toilet and in the tub and shower.  Use non-skid mats or decals in the tub or shower.  Place a plastic non-slip stool in the shower to sit on, if needed.  Keep floors dry and clean up all water on the floor immediately.  Remove soap buildup in the tub or shower on a regular basis.  Secure bath mats with non-slip, double-sided rug tape.  Remove throw rugs and tripping hazards from the floors. BEDROOMS  Install night lights.  Make sure a bedside light is easy to reach.  Do not use oversized bedding.  Keep a telephone by your bedside.  Have a firm chair with side arms to use for getting dressed.  Remove throw rugs and tripping hazards from the floor. KITCHEN  Keep handles on pots and pans turned toward the center of the stove. Use back burners when possible.  Clean up spills quickly and allow time for drying.  Avoid walking on wet floors.  Avoid hot utensils and knives.  Position shelves so they are not too high or low.  Place commonly used objects within  easy reach.  If necessary, use a sturdy step stool with a grab bar when reaching.  Keep electrical cables out of the way.  Do not use floor polish or wax that makes floors slippery. If you must use wax, use non-skid floor wax.  Remove throw rugs and tripping hazards from the floor. STAIRWAYS  Never leave objects on stairs.  Place handrails on both sides of stairways and use them. Fix any loose handrails. Make sure handrails on both sides of the stairways are as long as the stairs.  Check carpeting to make sure it is firmly attached along stairs. Make repairs to worn or loose carpet promptly.  Avoid placing throw rugs at the top or bottom of stairways, or properly secure the rug with carpet tape to prevent slippage. Get rid of throw rugs, if possible.  Have an electrician put in a light switch at the top and bottom of the stairs. OTHER FALL PREVENTION TIPS  Wear low-heel or rubber-soled shoes that are supportive and fit well. Wear closed toe shoes.  When using a stepladder, make sure it is fully opened and both spreaders are firmly locked. Do not climb a closed stepladder.  Add color or contrast paint or tape to grab bars and handrails in your home. Place contrasting color strips on first and last steps.  Learn and use mobility aids as needed. Install an electrical emergency response system.  Turn on lights to avoid dark areas. Replace light bulbs that burn out immediately. Get light switches that glow.  Arrange furniture to create clear pathways. Keep furniture in the same place.  Firmly attach carpet with non-skid or double-sided tape.  Eliminate uneven floor surfaces.  Select a carpet pattern that does not visually hide the edge of steps.  Be aware of all pets. OTHER HOME SAFETY TIPS  Set the water temperature for 120 F (48.8 C).  Keep emergency numbers on or near the telephone.  Keep smoke detectors on every level of the home and near sleeping areas. Document  Released: 01/03/2002 Document Revised: 07/15/2011 Document Reviewed: 04/04/2011 [facility] Patient Information 2015 [place], [place]. This information is not intended to replace advice given to you by your health care provider. Make sure you discuss any questions you have with your health care provider.   Preventive Care for Adults Ages 70 and over  Blood pressure check.** / Every 1 to 2 years.  Lipid and cholesterol check.**/ Every 5 years beginning at age 66.  Lung cancer screening. / Every year if you are aged 57-80 years and have a 30-pack-year history of smoking and currently smoke or have quit within the past 15 years. Yearly screening is stopped once you have quit smoking for at least 15 years or develop a health problem that would prevent you from having lung cancer treatment.  Fecal occult blood test (FOBT) of stool. / Every year beginning at age 21 and continuing until age 20. You may not have to do this test if you get a colonoscopy every 10 years.  Flexible sigmoidoscopy** or colonoscopy.** / Every 5 years for a flexible sigmoidoscopy or every 10 years for a colonoscopy beginning at age 31 and continuing until age 63.  Hepatitis C blood test.** / For all people born from [birth year] through 1963 and any individual with known risks for hepatitis C.  Abdominal aortic aneurysm (AAA) screening.** / A one-time screening for ages 81 to 48 years who are current or former smokers.  Skin self-exam. / Monthly.  Influenza vaccine. / Every year.  Tetanus, diphtheria, and acellular pertussis (Tdap/Td) vaccine.** / 1 dose of Td every 10 years.  Varicella vaccine.** / Consult your health care provider.  Zoster vaccine.** / 1 dose for adults aged 28 years or older.  Pneumococcal 13-valent conjugate (PCV13) vaccine.** / Consult your health care provider.  Pneumococcal polysaccharide (PPSV23) vaccine.** / 1 dose for all adults aged 51 years and older.  Meningococcal vaccine.** / Consult your  health care provider.  Hepatitis A vaccine.** / Consult your health care provider.  Hepatitis B vaccine.** / Consult your health care provider.  Haemophilus influenzae type b (Hib) vaccine.** / Consult your health care provider. **Family history and personal history of risk and conditions may change your health care provider's recommendations. Document Released: 03/11/2001 Document Revised: 01/18/2013 Document Reviewed: 06/10/2010 [facility] Patient Information 2015 [place], [place]. This information is not intended to replace advice given to you by your health care provider. Make sure you discuss any questions you have with your health care provider.

## 2014-05-10 NOTE — Assessment & Plan Note (Signed)
Well-controlled with current meds, recent BMP normal, no change

## 2014-05-10 NOTE — Assessment & Plan Note (Signed)
Doing well, check a PSA, the only problem he has is occasional bladder incontinence which is going on for years

## 2014-05-10 NOTE — Assessment & Plan Note (Signed)
Td 2013 pneumonia shot 2009 prevnar 2015 shingles shot 2-09  Cscope 10-08, Adenomatous polyp, repeated cscope again 03-2011 Normal, was rec no f/u  Diet and exercise discussed. He is doing well

## 2014-05-10 NOTE — Progress Notes (Signed)
Subjective:    Patient ID: Michael Johns, male    DOB: [birth year], 81 y.o.   MRN: [id number]  DOS:  05/10/2014 Type of visit - description :  Here for Medicare AWV: 1. Risk factors based on Past M, S, F history: reviewed  2.Physical Activities: yard work, home chores, Psychologist, occupational, plays golf 3.Depression/mood: neg screen 4.Hearing: R hear decreased > 40% x years , uses a hearing aid on-off @ the R . No problems @ normalconversation ; at baseline  5.ADL's: independent , drives 6.Fall Risk: no recent, prevention discussed  7.Home Safety: does feels safe at home  8.Height, weight, &visual acuity: see VS, sees eye doctor yearly, wear glasses  9. Counseling: yes  10.Labs ordered based on risk factors: yes 11. Referral Coordination, if needed 12.Care Plan, see assessment and plan, written plan provided  13. Cognitive Assessment: cognition & motor skills seem appropriate. Good memory 14. Care team updated 15. End of life care discussed , has a living will and medical POA  in addition, we discussed the following High cholesterol, he went back on Lipitor, still having some problems that they are improving with CoQ10 therapy Hypertension, good medication compliance, normal ambulatory BPs GERD, well-controlled with PPIs History of prostate cancer, due for a PSA  Review of Systems  Constitutional: No fever, chills. No unexplained wt changes. No unusual sweats HEENT: No dental problems, ear discharge, facial swelling, voice changes. No eye discharge, redness or intolerance to light Respiratory: No wheezing or difficulty breathing. No cough , mucus production Cardiovascular: No CP, leg swelling or palpitations GI: no nausea, vomiting, diarrhea or abdominal pain.  No blood in the stools. No dysphagia   Endocrine: No polyphagia, polyuria or polydipsia GU: No dysuria, gross hematuria, difficulty urinating. No urinary urgency or frequency. occ incontinence Musculoskeletal:  No joint swellings or unusual aches or pains Skin: No change in the color of the skin, palor or rash Allergic, immunologic: No environmental allergies or food allergies Neurological: No dizziness or syncope. No headaches. No diplopia, slurred speech, motor deficits, facial numbness Hematological: No enlarged lymph nodes, easy bruising or bleeding Psychiatry: No suicidal ideas, hallucinations, behavior problems or confusion. No unusual/severe anxiety or depression.    Past Medical History  Diagnosis Date  . HYPERTENSION 05/19/2006  . ADENOCARCINOMA, PROSTATE 07/01/2006  . [place], MILD 07/18/2009  . Gout     see 09-2008 note  . 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 Abdominal pain 11/09    due to constipation and ?pancreatitis- admitted  . GERD (gastroesophageal reflux disease)     Past Surgical History  Procedure Laterality Date  . Appendectomy    . Tonsillectomy    . Prostatectomy  05/2007    LADS (-) Bone scan (-)  . Inguinal hernia repair Right 11/2007  . Rt middle ear surgery    . Colonoscopy    . Polypectomy    . Sinus polyp removed    . Cataracts Bilateral 2014    History   Social History  . Marital Status: Married    Spouse Name: N/A  . Number of Children: 0  . Years of Education: N/A   Occupational History  . retired     Social History Main Topics  . Smoking status: Never Smoker   . Smokeless tobacco: Never Used  . Alcohol Use: Yes     Comment: socially   . Drug Use: No  . Sexual Activity: Not on file   Other Topics Concern  .  Not on file   Social History Narrative   Re-married, no children, Joycelyn Schmid , present wife has 3 daughters      retired-- worked for the [employer] History  Problem Relation Age of Onset  . Hypertension Mother   . Diabetes Mother   . Heart disease Mother     CABG 82 y/o  . Esophageal cancer Father   . Heart disease Maternal Grandmother     MI  . Prostate cancer Neg Hx   . Colon cancer Neg Hx   .  Rectal cancer Neg Hx   . Stomach cancer Neg Hx   . Leukemia Maternal Grandfather   . Cancer Other     all paternal uncles-types unknown      Medication List       This list is accurate as of: 05/10/14  6:18 PM.  Always use your most recent med list.               amLODipine 5 MG tablet  Commonly known as:  NORVASC  Take 1 tablet by mouth  daily     aspirin 81 MG tablet  Take 81 mg by mouth daily.     atorvastatin 10 MG tablet  Commonly known as:  LIPITOR  Take 10 mg by mouth daily.     Co Q-10 100 MG Caps  Take 1 capsule by mouth daily.     FISH OIL PO  Take 1 tablet by mouth every other day.     hydrochlorothiazide 12.5 MG tablet  Commonly known as:  HYDRODIURIL  Take 1 tablet by mouth  daily     metoprolol succinate 25 MG 24 hr tablet  Commonly known as:  TOPROL-XL  Take 1 tablet by mouth  daily     multivitamin tablet  Take 1 tablet by mouth daily.     omeprazole 20 MG capsule  Commonly known as:  PRILOSEC  Take 1 capsule by mouth  daily     polyethylene glycol packet  Commonly known as:  MIRALAX / GLYCOLAX  Take 17 g by mouth as needed.     senna 8.6 MG tablet  Commonly known as:  SENOKOT  Take 1 tablet by mouth as needed. As needed           Objective:   Physical Exam BP 124/68 mmHg  Pulse 57  Temp(Src) 98 F (36.7 C) (Oral)  Ht 5\' 3"  (1.6 m)  Wt 152 lb (68.947 kg)  BMI 26.93 kg/m2  SpO2 99%  General:   Well developed, well nourished . NAD.  HEENT:  Normocephalic . Face symmetric, atraumatic Lungs:  CTA B Normal respiratory effort, no intercostal retractions, no accessory muscle use. Heart: RRR,  no murmur.  Abdomen:  Not distended, soft, non-tender. No rebound or rigidity. No mass,organomegaly Muscle skeletal: no pretibial edema bilaterally  Skin: Not pale. Not jaundice Neurologic:  alert & oriented X3.  Speech normal, gait appropriate for age and unassisted Psych--  Cognition and judgment appear intact.  Cooperative with  normal attention span and concentration.  Behavior appropriate. No anxious or depressed appearing.      Assessment & Plan:

## 2014-05-10 NOTE — Assessment & Plan Note (Signed)
Based on the last cholesterol I recommend observation however the patient decided to go back on Lipitor. Good compliance, has some cramps but they are getting better with CoQ10. Plan: Check FLP, AST, ALT. Consider decrease Lipitor dose depending on results.

## 2014-05-12 ENCOUNTER — Encounter: Payer: Self-pay | Admitting: Internal Medicine

## 2014-05-12 LAB — CK TOTAL AND CKMB (NOT AT ARMC): Total CK: 253 U/L — ABNORMAL HIGH (ref 7–232)

## 2014-05-15 ENCOUNTER — Telehealth: Payer: Self-pay | Admitting: Internal Medicine

## 2014-05-15 NOTE — Telephone Encounter (Signed)
call the patient: Cholesterol is under excellent control with Lipitor 10 mg. PSA is 0. The muscle test -CK- is a slightly elevated but I don't think that explain his aches and pains. Other labs are very good Plan: Okay to reduce Lipitor 10 mg to half tablet daily, hopefully that will keep his cholesterol down and decrease aches. Will recheck labs when he comes back in 6 months

## 2014-05-16 NOTE — Telephone Encounter (Signed)
Spoke with Pt, he informed me he had misread the results and tried deleting message he sent [place]. He understands that his cholesterol is under excellent control and can take 1/2 tablet of Lipitor 10 mg daily which may help his aches and pains. Pt verbalized understanding.

## 2014-07-14 ENCOUNTER — Other Ambulatory Visit: Payer: Self-pay | Admitting: Internal Medicine

## 2014-10-11 ENCOUNTER — Ambulatory Visit: Payer: Medicare Other

## 2014-10-11 DIAGNOSIS — Z23 Encounter for immunization: Secondary | ICD-10-CM

## 2014-10-11 MED ORDER — INFLUENZA VAC SPLIT QUAD 0.5 ML IM SUSY
0.5000 mL | PREFILLED_SYRINGE | Freq: Once | INTRAMUSCULAR | Status: AC
  Administered 2014-10-11: 0.5 mL via INTRAMUSCULAR

## 2014-10-28 ENCOUNTER — Other Ambulatory Visit: Payer: Self-pay | Admitting: Internal Medicine

## 2014-11-09 ENCOUNTER — Ambulatory Visit (INDEPENDENT_AMBULATORY_CARE_PROVIDER_SITE_OTHER): Payer: Medicare Other | Admitting: Internal Medicine

## 2014-11-09 ENCOUNTER — Encounter: Payer: Self-pay | Admitting: Internal Medicine

## 2014-11-09 VITALS — BP 122/66 | HR 56 | Temp 97.6°F | Ht 63.0 in | Wt 151.4 lb

## 2014-11-09 DIAGNOSIS — E785 Hyperlipidemia, unspecified: Secondary | ICD-10-CM | POA: Diagnosis not present

## 2014-11-09 DIAGNOSIS — R1310 Dysphagia, unspecified: Secondary | ICD-10-CM | POA: Diagnosis not present

## 2014-11-09 DIAGNOSIS — I10 Essential (primary) hypertension: Secondary | ICD-10-CM | POA: Diagnosis not present

## 2014-11-09 DIAGNOSIS — Z09 Encounter for follow-up examination after completed treatment for conditions other than malignant neoplasm: Secondary | ICD-10-CM

## 2014-11-09 LAB — BASIC METABOLIC PANEL
BUN: 15 mg/dL (ref 6–23)
CALCIUM: 9.3 mg/dL (ref 8.4–10.5)
CHLORIDE: 96 meq/L (ref 96–112)
CO2: 33 mEq/L — ABNORMAL HIGH (ref 19–32)
Creatinine, Ser: 1.1 mg/dL (ref 0.40–1.50)
GFR: 68.36 mL/min (ref >60.00)
Glucose, Bld: 94 mg/dL (ref 70–99)
POTASSIUM: 4.5 meq/L (ref 3.5–5.1)
Sodium: 133 mEq/L — ABNORMAL LOW (ref 135–145)

## 2014-11-09 NOTE — Progress Notes (Signed)
Pre visit review using our clinic review tool, if applicable. No additional management support is needed unless otherwise documented below in the visit note. 

## 2014-11-09 NOTE — Assessment & Plan Note (Signed)
HTN: Seems well-controlled, check a BMP Hyperlipidemia and leg cramps: We again had a long discussion about the issue,multile questions answered to the best of my ability, eventually was recommended to decrease Lipitor dose but couldn't break the tablet in half. Recommend to take 1 tablet every other day Also rec  CoQ10 100 mg daily and vitamin E 400 daily. Dysphagia: Symptoms are stable, they are infrequent and the patient decided not to proceed with a EGD. Recommend to continue PPIs and call me if necessary. RTC 6 -8 months

## 2014-11-09 NOTE — Progress Notes (Signed)
Subjective:    Patient ID: Michael Johns, male    DOB: 03-06-1932, 81 y.o.   MRN: [id number]  DOS:  11/09/2014 Type of visit - description : Routine visit Interval history: He continue with Lipitor 10 mg, still have mild cramps at legs and feet.  HTN: Good compliance with medications, ambulatory BPs when checked are normal.   Review of Systems No chest pain or difficulty breathing No nausea, vomiting, diarrhea. Continue with infreuent dysphagia, symptoms are not crescendo, no problems with solids or tablets.  Past Medical History  Diagnosis Date  . HYPERTENSION 05/19/2006  . ADENOCARCINOMA, PROSTATE 07/01/2006  . [place], MILD 07/18/2009  . Gout     see 09-2008 note  . 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 Abdominal pain 11/09    due to constipation and ?pancreatitis- admitted  . GERD (gastroesophageal reflux disease)     Past Surgical History  Procedure Laterality Date  . Appendectomy    . Tonsillectomy    . Prostatectomy  05/2007    LADS (-) Bone scan (-)  . Inguinal hernia repair Right 11/2007  . Rt middle ear surgery    . Colonoscopy    . Polypectomy    . Sinus polyp removed    . Cataracts Bilateral 2014    Social History   Social History  . Marital Status: Married    Spouse Name: N/A  . Number of Children: 0  . Years of Education: N/A   Occupational History  . retired     Social History Main Topics  . Smoking status: Never Smoker   . Smokeless tobacco: Never Used  . Alcohol Use: Yes     Comment: socially   . Drug Use: No  . Sexual Activity: Not on file   Other Topics Concern  . Not on file   Social History Narrative   Re-married, no children, Joycelyn Schmid , present wife has 3 daughters      retired-- worked for the [employer]        Medication List       This list is accurate as of: 11/09/14  6:43 PM.  Always use your most recent med list.               amLODipine 5 MG tablet  Commonly known as:  NORVASC  Take 1 tablet (5 mg  total) by mouth daily.     aspirin 81 MG tablet  Take 81 mg by mouth daily.     atorvastatin 10 MG tablet  Commonly known as:  LIPITOR  Take 1 tablet (10 mg total) by mouth daily.     Co Q-10 100 MG Caps  Take 1 capsule by mouth daily.     FISH OIL PO  Take 1 tablet by mouth every other day.     hydrochlorothiazide 12.5 MG tablet  Commonly known as:  HYDRODIURIL  Take 1 tablet by mouth  daily     metoprolol succinate 25 MG 24 hr tablet  Commonly known as:  TOPROL-XL  Take 1 tablet (25 mg total) by mouth daily.     multivitamin tablet  Take 1 tablet by mouth daily.     omeprazole 20 MG capsule  Commonly known as:  PRILOSEC  Take 1 capsule (20 mg total) by mouth daily.     polyethylene glycol packet  Commonly known as:  MIRALAX / GLYCOLAX  Take 17 g by mouth as needed.     senna 8.6  MG tablet  Commonly known as:  SENOKOT  Take 1 tablet by mouth as needed. As needed           Objective:   Physical Exam BP 122/66 mmHg  Pulse 56  Temp(Src) 97.6 F (36.4 C) (Oral)  Ht 5\' 3"  (1.6 m)  Wt 151 lb 6 oz (68.663 kg)  BMI 26.82 kg/m2  SpO2 97% General:   Well developed, well nourished . NAD.  HEENT:  Normocephalic . Face symmetric, atraumatic Lungs:  CTA B Normal respiratory effort, no intercostal retractions, no accessory muscle use. Heart: RRR,  no murmur.  No pretibial edema bilaterally  Skin: Not pale. Not jaundice Neurologic:  alert & oriented X3.  Speech normal, gait appropriate for age and unassisted Psych--  Cognition and judgment appear intact.  Cooperative with normal attention span and concentration.  Behavior appropriate. No anxious or depressed appearing.       Assessment & Plan:    assessment> HTN Hyperlipidemia Leg cramps-- lipitor d/c in the past, cramps decreased, back on lipitor as cramps are  mild and benefits > risks. CoQ10 helps, CKs normal Prostate cancer 2008 DJD GERD,  Dysphagia-- Rx  EGD 02-2014 (declined,sx chronic,  mild) Chronic mild constipation, fecal impaction 2012 HOH  Plan  HTN: Seems well-controlled, check a BMP Hyperlipidemia and leg cramps: We again had a long discussion about the issue,multile questions answered to the best of my ability, eventually was recommended to decrease Lipitor dose but couldn't break the tablet in half. Recommend to take 1 tablet every other day Also rec  CoQ10 100 mg daily and vitamin E 400 daily. Dysphagia: Symptoms are stable, they are infrequent and the patient decided not to proceed with a EGD. Recommend to continue PPIs and call me if necessary. RTC 6 -8 months  Today, I spent more than   25  min with the patient: >50% of the time counseling regards cramps relationship Lipitor, Lipitor dosing,use of CoQ10 and vitaminD., Also reviewing the chart

## 2014-11-09 NOTE — Patient Instructions (Signed)
Get your blood work before you leave   Take Lipitor 10 mg every other day CoQ10 100 mg daily Vitamin E 400 mg once a day   Next visit  for a   routine checkup in 6-8 months, fasting (15 minutes) Please schedule an appointment at the front desk

## 2014-11-13 ENCOUNTER — Encounter: Payer: Self-pay | Admitting: Internal Medicine

## 2015-02-12 ENCOUNTER — Emergency Department (HOSPITAL_BASED_OUTPATIENT_CLINIC_OR_DEPARTMENT_OTHER)
Admission: EM | Admit: 2015-02-12 | Discharge: 2015-02-12 | Disposition: A | Discharge status: Home / Self Care | Payer: Medicare Other | Attending: Emergency Medicine | Admitting: Emergency Medicine

## 2015-02-12 ENCOUNTER — Encounter (HOSPITAL_BASED_OUTPATIENT_CLINIC_OR_DEPARTMENT_OTHER): Payer: Self-pay | Admitting: *Deleted

## 2015-02-12 DIAGNOSIS — Z7982 Long term (current) use of aspirin: Secondary | ICD-10-CM | POA: Diagnosis not present

## 2015-02-12 DIAGNOSIS — K5900 Constipation, unspecified: Secondary | ICD-10-CM | POA: Insufficient documentation

## 2015-02-12 DIAGNOSIS — I10 Essential (primary) hypertension: Secondary | ICD-10-CM | POA: Diagnosis not present

## 2015-02-12 DIAGNOSIS — H9190 Unspecified hearing loss, unspecified ear: Secondary | ICD-10-CM | POA: Insufficient documentation

## 2015-02-12 DIAGNOSIS — R42 Dizziness and giddiness: Secondary | ICD-10-CM | POA: Diagnosis not present

## 2015-02-12 DIAGNOSIS — Z8739 Personal history of other diseases of the musculoskeletal system and connective tissue: Secondary | ICD-10-CM | POA: Insufficient documentation

## 2015-02-12 DIAGNOSIS — K219 Gastro-esophageal reflux disease without esophagitis: Secondary | ICD-10-CM | POA: Diagnosis not present

## 2015-02-12 DIAGNOSIS — Z8546 Personal history of malignant neoplasm of prostate: Secondary | ICD-10-CM | POA: Insufficient documentation

## 2015-02-12 DIAGNOSIS — Z79899 Other long term (current) drug therapy: Secondary | ICD-10-CM | POA: Insufficient documentation

## 2015-02-12 DIAGNOSIS — E785 Hyperlipidemia, unspecified: Secondary | ICD-10-CM | POA: Diagnosis not present

## 2015-02-12 NOTE — ED Notes (Signed)
C/o constipation x 4 days. C/o low abd pain with nausea and sweaty. Feels faint. C/o bowel blockage in past.

## 2015-02-12 NOTE — Discharge Instructions (Signed)

## 2015-02-12 NOTE — ED Notes (Signed)
SSE completed. Pt tolerated procedure well.

## 2015-02-12 NOTE — ED Notes (Signed)
Room set up for Dr. Tamera Punt to do dis impaction.

## 2015-02-12 NOTE — ED Notes (Signed)
Soap suds enema given. Pt tolerated procedure well.

## 2015-02-12 NOTE — ED Notes (Addendum)
Called to room d/t pt feeling like he was going to pass out while on bedside toilet. Assisted back to stretcher. VSS. Color adequate. Denies pain or other s/s. Cool compress given.

## 2015-02-12 NOTE — ED Provider Notes (Signed)
CSN: [id number]     Arrival date & time 02/12/15  1511 History  By signing my name below, I, Michael Johns, attest that this documentation has been prepared under the direction and in the presence of Malvin Johns, MD. Electronically Signed: Soijett Johns, ED Scribe. 02/12/2015. 3:57 PM.   Chief Complaint  Patient presents with  . Constipation     Patient is a 82 y.o. male presenting with constipation. The history is provided by the patient. No language interpreter was used.  Constipation Relieved by:  None tried Worsened by:  Nothing tried Ineffective treatments: senokot and maxitrate. Associated symptoms: nausea   Associated symptoms: no abdominal pain, no back pain, no diarrhea, no fever and no vomiting     HPI Comments: Michael Johns is a 82 y.o. male with a medical hx of bowel obstruction, abdominal pain, HTN, and adenocarcinoma, prostate, who presents to the Emergency Department complaining of moderate constipation onset 4 days. He notes that he thinks that he is having a bowel blockage and that he has had one in the past (from constipation). He reports that the last time he was seen for these symptoms he was given oral medications that alleviated his symptoms. He states that when he went to sit down the rectal pressure was so immense that he felt like he was going to pass out. He denies any other blockages in the past. He states that he is having associated symptoms of nausea, diaphoresis, and lightheaded. He states that he has tried senokot this morning and maxitrate at 1 PM with no relief for his symptoms. He denies fever, difficulty urinating, abdominal pain, and any other symptoms.   Past Medical History  Diagnosis Date  . HYPERTENSION 05/19/2006  . ADENOCARCINOMA, PROSTATE 07/01/2006  . [place], MILD 07/18/2009  . Gout     see 09-2008 note  . 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 Abdominal pain 11/09    due to constipation and ?pancreatitis- admitted  . GERD  (gastroesophageal reflux disease)   . Bowel obstruction [facility])    Past Surgical History  Procedure Laterality Date  . Appendectomy    . Tonsillectomy    . Prostatectomy  05/2007    LADS (-) Bone scan (-)  . Inguinal hernia repair Right 11/2007  . Rt middle ear surgery    . Colonoscopy    . Polypectomy    . Sinus polyp removed    . Cataracts Bilateral 2014   Family History  Problem Relation Age of Onset  . Hypertension Mother   . Diabetes Mother   . Heart disease Mother     CABG 8 y/o  . Esophageal cancer Father   . Heart disease Maternal Grandmother     MI  . Prostate cancer Neg Hx   . Colon cancer Neg Hx   . Rectal cancer Neg Hx   . Stomach cancer Neg Hx   . Leukemia Maternal Grandfather   . Cancer Other     all paternal uncles-types unknown   Social History  Substance Use Topics  . Smoking status: Never Smoker   . Smokeless tobacco: Never Used  . Alcohol Use: Yes     Comment: socially     Review of Systems  Constitutional: Negative for fever, chills and fatigue.  HENT: Negative for congestion, rhinorrhea and sneezing.   Eyes: Negative.   Respiratory: Negative for cough, chest tightness and shortness of breath.   Cardiovascular: Negative for chest pain and leg  swelling.  Gastrointestinal: Positive for nausea and constipation. Negative for vomiting, abdominal pain, diarrhea and blood in stool.  Genitourinary: Negative for frequency, hematuria, flank pain and difficulty urinating.  Musculoskeletal: Negative for back pain and arthralgias.  Skin: Negative for rash.  Neurological: Positive for light-headedness. Negative for dizziness, speech difficulty, weakness, numbness and headaches.      Allergies  Cephalexin  Home Medications   Prior to Admission medications   Medication Sig Start Date End Date Taking? Authorizing Provider  amLODipine (NORVASC) 5 MG tablet Take 1 tablet (5 mg total) by mouth daily. 10/30/14  Yes Colon Branch, MD  aspirin 81 MG tablet Take  81 mg by mouth daily.    Yes Historical Provider, MD  atorvastatin (LIPITOR) 10 MG tablet Take 1 tablet (10 mg total) by mouth daily. 07/14/14  Yes Colon Branch, MD  Coenzyme Q10 (CO Q-10) 100 MG CAPS Take 1 capsule by mouth daily.   Yes Historical Provider, MD  hydrochlorothiazide (HYDRODIURIL) 12.5 MG tablet Take 1 tablet by mouth  daily 03/27/14  Yes Colon Branch, MD  metoprolol succinate (TOPROL-XL) 25 MG 24 hr tablet Take 1 tablet (25 mg total) by mouth daily. 10/30/14  Yes Colon Branch, MD  Multiple Vitamin (MULTIVITAMIN) tablet Take 1 tablet by mouth daily.     Yes Historical Provider, MD  Omega-3 Fatty Acids (FISH OIL PO) Take 1 tablet by mouth every other day.   Yes Historical Provider, MD  omeprazole (PRILOSEC) 20 MG capsule Take 1 capsule (20 mg total) by mouth daily. 07/14/14  Yes Colon Branch, MD  polyethylene glycol [facility] / GLYCOLAX) packet Take 17 g by mouth as needed.     Yes Historical Provider, MD  senna (SENOKOT) 8.6 MG tablet Take 1 tablet by mouth as needed. As needed   Yes Historical Provider, MD   BP 127/65 mmHg  Pulse 76  Temp(Src) 98 F (36.7 C) (Oral)  Resp 20  Wt 145 lb (65.772 kg)  SpO2 97% Physical Exam  Constitutional: He is oriented to person, place, and time. He appears well-developed and well-nourished.  HENT:  Head: Normocephalic and atraumatic.  Eyes: Pupils are equal, round, and reactive to light.  Neck: Normal range of motion. Neck supple.  Cardiovascular: Normal rate, regular rhythm and normal heart sounds.  Exam reveals no gallop and no friction rub.   No murmur heard. Pulmonary/Chest: Effort normal and breath sounds normal. No respiratory distress. He has no wheezes. He has no rales. He exhibits no tenderness.  Abdominal: Soft. Bowel sounds are normal. There is no tenderness. There is no rebound and no guarding.  Genitourinary:  Moderate amount of stool in the rectum  Musculoskeletal: Normal range of motion. He exhibits no edema.  Lymphadenopathy:    He  has no cervical adenopathy.  Neurological: He is alert and oriented to person, place, and time.  Skin: Skin is warm and dry. No rash noted.  Psychiatric: He has a normal mood and affect.  Nursing note and vitals reviewed.   ED Course  Procedures (including critical care time) DIAGNOSTIC STUDIES: Oxygen Saturation is 100% on RA, nl by my interpretation.    COORDINATION OF CARE: 3:56 PM Discussed treatment plan with pt at bedside which includes EKG, enema and pt agreed to plan.    Labs Review Labs Reviewed - No data to display  Imaging Review No results found. I have personally reviewed and evaluated these images as part of my medical decision-making.   EKG Interpretation  Date/Time:  Monday February 12 2015 15:25:52 EST Ventricular Rate:  65 PR Interval:  200 QRS Duration: 74 QT Interval:  458 QTC Calculation: 476 R Axis:   68 Text Interpretation:  Normal sinus rhythm Normal ECG since last tracing no  significant change Confirmed by Laycie Schriner  MD, Zakaiya Lares ([id number]) on 02/12/2015  3:46:24 PM      MDM   Final diagnoses:  Constipation, unspecified constipation type    Patient was initially given a soapsuds enema without relief. I tried a manual disimpaction but this was unsuccessful as the stool was little bit too high in the rectum. He had a second soapsuds enema with a large amount of stool following this. He states he's feeling much better. He was discharged from good condition. He was advised to start using Metamucil daily. He was advised to follow-up with his PCP for any ongoing symptoms or return here as needed if he has worsening symptoms.  I personally performed the services described in this documentation, which was scribed in my presence.  The recorded information has been reviewed and considered.     Malvin Johns, MD 02/12/15 2110

## 2015-02-12 NOTE — ED Notes (Signed)
Called to room again. Pt on BSC. States he is ok. Small amount of hard brown stool noted.

## 2015-02-16 ENCOUNTER — Ambulatory Visit: Payer: Medicare Other | Admitting: Physician Assistant

## 2015-02-19 ENCOUNTER — Encounter: Payer: Self-pay | Admitting: Internal Medicine

## 2015-02-19 ENCOUNTER — Ambulatory Visit (INDEPENDENT_AMBULATORY_CARE_PROVIDER_SITE_OTHER): Payer: Medicare Other | Admitting: Internal Medicine

## 2015-02-19 VITALS — BP 118/74 | HR 64 | Temp 97.5°F | Ht 63.0 in | Wt 150.0 lb

## 2015-02-19 DIAGNOSIS — K5909 Other constipation: Secondary | ICD-10-CM | POA: Diagnosis not present

## 2015-02-19 MED ORDER — LACTULOSE 10 GM/15ML PO SOLN: 20.0000 g | Freq: Every day | ORAL | Status: DC | PRN

## 2015-02-19 MED FILL — GENERLAC 10 GM/15 ML SOLN: 10 | 16 days supply | Qty: 473 | Fill #0

## 2015-02-19 NOTE — Progress Notes (Signed)
Subjective:    Patient ID: Michael Johns, male    DOB: [birth year], 82 y.o.   MRN: [id number]  DOS:  02/19/2015 Type of visit - description : ER follow-up Interval history: Chart reviewed: Went to the ER 02/12/2015 with constipation for 4 days. Was diagnosed with high fecal impact, unable to resolve manually, but better after enemas 2. Since then he is back to normal. Currently strategy is taking MiraLAX daily, use Senokot if he does not have a bowel movement in 2 or 3 days. States is drinking plenty of fluids and trying to increase his fresh fruits and vegetables intake   Review of Systems  Denies fever, chills. No abdominal pain. No nausea vomiting  Past Medical History  Diagnosis Date  . HYPERTENSION 05/19/2006  . ADENOCARCINOMA, PROSTATE 07/01/2006  . [place], MILD 07/18/2009  . Gout     see 09-2008 note  . 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 Abdominal pain 11/09    due to constipation and ?pancreatitis- admitted  . GERD (gastroesophageal reflux disease)   . Constipation     chronic, impaction ~2012 (admited) and 01-2015    Past Surgical History  Procedure Laterality Date  . Appendectomy    . Tonsillectomy    . Prostatectomy  05/2007    LADS (-) Bone scan (-)  . Inguinal hernia repair Right 11/2007  . Rt middle ear surgery    . Colonoscopy    . Polypectomy    . Sinus polyp removed    . Cataracts Bilateral 2014    Social History   Social History  . Marital Status: Married    Spouse Name: N/A  . Number of Children: 0  . Years of Education: N/A   Occupational History  . retired     Social History Main Topics  . Smoking status: Never Smoker   . Smokeless tobacco: Never Used  . Alcohol Use: Yes     Comment: socially   . Drug Use: No  . Sexual Activity: Not on file   Other Topics Concern  . Not on file   Social History Narrative   Re-married, no children, Joycelyn Schmid , present wife has 3 daughters      retired-- worked for the [employer]         Medication List       This list is accurate as of: 02/19/15 10:25 AM.  Always use your most recent med list.               amLODipine 5 MG tablet  Commonly known as:  NORVASC  Take 1 tablet (5 mg total) by mouth daily.     aspirin 81 MG tablet  Take 81 mg by mouth daily.     atorvastatin 10 MG tablet  Commonly known as:  LIPITOR  Take 1 tablet (10 mg total) by mouth daily.     Co Q-10 100 MG Caps  Take 1 capsule by mouth daily.     FISH OIL PO  Take 1 tablet by mouth every other day.     hydrochlorothiazide 12.5 MG tablet  Commonly known as:  HYDRODIURIL  Take 1 tablet by mouth  daily     lactulose 10 GM/15ML solution  Commonly known as:  CHRONULAC  Take 30 mLs (20 g total) by mouth daily as needed for mild constipation.     metoprolol succinate 25 MG 24 hr tablet  Commonly known as:  TOPROL-XL  Take 1 tablet (  25 mg total) by mouth daily.     multivitamin tablet  Take 1 tablet by mouth daily.     omeprazole 20 MG capsule  Commonly known as:  PRILOSEC  Take 1 capsule (20 mg total) by mouth daily.     polyethylene glycol packet  Commonly known as:  MIRALAX / GLYCOLAX  Take 17 g by mouth as needed.     senna 8.6 MG tablet  Commonly known as:  SENOKOT  Take 1 tablet by mouth as needed. Reported on 02/19/2015           Objective:   Physical Exam BP 118/74 mmHg  Pulse 64  Temp(Src) 97.5 F (36.4 C) (Oral)  Ht 5\' 3"  (1.6 m)  Wt 150 lb (68.04 kg)  BMI 26.58 kg/m2  SpO2 96% General:   Well developed, well nourished . NAD.  HEENT:  Normocephalic . Face symmetric, atraumatic  Abdomen:  Not distended, soft, non-tender. No rebound or rigidity. No mass,organomegaly Skin: Not pale. Not jaundice Neurologic:  alert & oriented X3.  Speech normal, gait appropriate for age and unassisted Psych--  Cognition and judgment appear intact.  Cooperative with normal attention span and concentration.  Behavior appropriate. No anxious or depressed appearing.     Assessment & Plan:   Assessment> HTN Hyperlipidemia Leg cramps-- lipitor d/c in the past, cramps decreased, back on lipitor as cramps are  mild and benefits > risks. CoQ10 helps, CKs normal Prostate cancer 2008 DJD GERD,  Dysphagia-- Rx  EGD 02-2014 (declined,sx chronic, mild) Chronic mild constipation, fecal impaction 2012 (admited per pt) and 01-2015 (ER): on MiraLAX qd, senokot or lactulose prn HOH  Plan  Acute and chronic constipation: Was seen at the ER with fecal impaction recently, states that was triggered by not drinking enough fluids. Currently doing MiraLAX, Senokot as needed. Asked him to try lactulose as needed and see what works best. Dicussed a high fiber diet. ER if he ever have constipation associated with fever, abdominal pain, nausea vomiting. Patient verbalized understanding RTC 04-2015 as schedule

## 2015-02-19 NOTE — Patient Instructions (Addendum)
Miralax daily  Use senokot or lactulose every 2 -3 days if needed  Consider a self fleet enema as needed

## 2015-02-19 NOTE — Progress Notes (Signed)
Pre visit review using our clinic review tool, if applicable. No additional management support is needed unless otherwise documented below in the visit note. 

## 2015-03-23 ENCOUNTER — Telehealth: Payer: Self-pay | Admitting: Internal Medicine

## 2015-03-23 ENCOUNTER — Other Ambulatory Visit: Payer: Self-pay | Admitting: Internal Medicine

## 2015-03-23 ENCOUNTER — Ambulatory Visit (INDEPENDENT_AMBULATORY_CARE_PROVIDER_SITE_OTHER): Payer: Medicare Other | Admitting: Family Medicine

## 2015-03-23 ENCOUNTER — Encounter: Payer: Self-pay | Admitting: Family Medicine

## 2015-03-23 VITALS — BP 125/62 | HR 66 | Temp 98.7°F | Ht 63.0 in | Wt 152.1 lb

## 2015-03-23 DIAGNOSIS — E782 Mixed hyperlipidemia: Secondary | ICD-10-CM

## 2015-03-23 DIAGNOSIS — I10 Essential (primary) hypertension: Secondary | ICD-10-CM | POA: Diagnosis not present

## 2015-03-23 DIAGNOSIS — R1013 Epigastric pain: Secondary | ICD-10-CM

## 2015-03-23 DIAGNOSIS — K219 Gastro-esophageal reflux disease without esophagitis: Secondary | ICD-10-CM | POA: Diagnosis not present

## 2015-03-23 DIAGNOSIS — R0789 Other chest pain: Secondary | ICD-10-CM

## 2015-03-23 LAB — COMPREHENSIVE METABOLIC PANEL
ALBUMIN: 4.2 g/dL (ref 3.5–5.2)
ALT: 15 U/L (ref 0–53)
AST: 22 U/L (ref 0–37)
Alkaline Phosphatase: 67 U/L (ref 39–117)
BUN: 13 mg/dL (ref 6–23)
CALCIUM: 9.4 mg/dL (ref 8.4–10.5)
CHLORIDE: 93 meq/L — AB (ref 96–112)
CO2: 31 mEq/L (ref 19–32)
Creatinine, Ser: 1.05 mg/dL (ref 0.40–1.50)
GFR: 72.06 mL/min (ref >60.00)
Glucose, Bld: 97 mg/dL (ref 70–99)
POTASSIUM: 4.8 meq/L (ref 3.5–5.1)
Sodium: 127 mEq/L — ABNORMAL LOW (ref 135–145)
Total Bilirubin: 0.6 mg/dL (ref 0.2–1.2)
Total Protein: 7.6 g/dL (ref 6.0–8.3)

## 2015-03-23 LAB — CBC
HCT: 40.9 % (ref 39.0–52.0)
HEMOGLOBIN: 14.3 g/dL (ref 13.0–17.0)
MCHC: 34.8 g/dL (ref 30.0–36.0)
MCV: 87.9 fl (ref 78.0–100.0)
PLATELETS: 286 10*3/uL (ref 150.0–400.0)
RBC: 4.65 Mil/uL (ref 4.22–5.81)
RDW: 14.4 % (ref 11.5–15.5)
WBC: 12.3 10*3/uL — AB (ref 4.0–10.5)

## 2015-03-23 LAB — TROPONIN I: TNIDX: 0.01 ug/l (ref 0.00–0.06)

## 2015-03-23 LAB — LIPID PANEL
CHOLESTEROL: 152 mg/dL (ref 0–200)
HDL: 49.6 mg/dL (ref >39.00)
LDL CALC: 66 mg/dL (ref 0–99)
NonHDL: 102.63
TRIGLYCERIDES: 182 mg/dL — AB (ref 0.0–149.0)
Total CHOL/HDL Ratio: 3
VLDL: 36.4 mg/dL (ref 0.0–40.0)

## 2015-03-23 LAB — H. PYLORI ANTIBODY, IGG: H PYLORI IGG: POSITIVE — AB

## 2015-03-23 LAB — TSH: TSH: 3.15 u[IU]/mL (ref 0.35–4.50)

## 2015-03-23 LAB — MAGNESIUM: Magnesium: 2.1 mg/dL (ref 1.5–2.5)

## 2015-03-23 MED ORDER — OMEPRAZOLE 20 MG PO CPDR: DELAYED_RELEASE_CAPSULE | ORAL | Status: DC

## 2015-03-23 MED ORDER — NITROGLYCERIN 0.4 MG SL SUBL: 0.4000 mg | SUBLINGUAL_TABLET | SUBLINGUAL | Status: DC | PRN

## 2015-03-23 MED ORDER — AMOXICILLIN 500 MG PO TABS: ORAL_TABLET | ORAL | Status: DC

## 2015-03-23 MED ORDER — RANITIDINE HCL 300 MG PO TABS
300.0000 mg | ORAL_TABLET | Freq: Every evening | ORAL | Status: DC | PRN
Start: 2015-03-23 — End: 2015-04-11

## 2015-03-23 MED ORDER — CLARITHROMYCIN 500 MG PO TABS: 500.0000 mg | ORAL_TABLET | Freq: Two times a day (BID) | ORAL | Status: DC

## 2015-03-23 MED FILL — AMOXICILLIN 500 MG CAPSULE: 500 | 10 days supply | Qty: 40 | Fill #0

## 2015-03-23 MED FILL — CLARITHROMYCIN 500 MG TAB: 500 | 10 days supply | Qty: 20 | Fill #0

## 2015-03-23 MED FILL — NITROGLYCERIN 0.4 MG TAB SL: 0.4 | 7 days supply | Qty: 25 | Fill #0

## 2015-03-23 MED FILL — OMEPRAZOLE DR 20 MG CAPSULE: 20 | 50 days supply | Qty: 60 | Fill #0

## 2015-03-23 MED FILL — raNITIdine HCL 300 MG TABS: 300 | 30 days supply | Qty: 30 | Fill #0

## 2015-03-23 NOTE — Progress Notes (Signed)
Pre visit review using our clinic review tool, if applicable. No additional management support is needed unless otherwise documented below in the visit note. 

## 2015-03-23 NOTE — Patient Instructions (Signed)
Daily probiotics [employer] or [employer] or [employer] makes a 10 strain probiotic daily   Food Choices for Gastroesophageal Reflux Disease, Adult When you have gastroesophageal reflux disease (GERD), the foods you eat and your eating habits are very important. Choosing the right foods can help ease your discomfort.  WHAT GUIDELINES DO I NEED TO FOLLOW?   Choose fruits, vegetables, whole grains, and low-fat dairy products.   Choose low-fat meat, fish, and poultry.  Limit fats such as oils, salad dressings, butter, nuts, and avocado.   Keep a food diary. This helps you identify foods that cause symptoms.   Avoid foods that cause symptoms. These may be different for everyone.   Eat small meals often instead of 3 large meals a day.   Eat your meals slowly, in a place where you are relaxed.   Limit fried foods.   Cook foods using methods other than frying.   Avoid drinking alcohol.   Avoid drinking large amounts of liquids with your meals.   Avoid bending over or lying down until 2-3 hours after eating.  WHAT FOODS ARE NOT RECOMMENDED?  These are some foods and drinks that may make your symptoms worse: Vegetables Tomatoes. Tomato juice. Tomato and spaghetti sauce. Chili peppers. Onion and garlic. Horseradish. Fruits Oranges, grapefruit, and lemon (fruit and juice). Meats High-fat meats, fish, and poultry. This includes hot dogs, ribs, ham, sausage, salami, and bacon. Dairy Whole milk and chocolate milk. Sour cream. Cream. Butter. Ice cream. Cream cheese.  Drinks Coffee and tea. Bubbly (carbonated) drinks or energy drinks. Condiments Hot sauce. Barbecue sauce.  Sweets/Desserts Chocolate and cocoa. Donuts. Peppermint and spearmint. Fats and Oils High-fat foods. This includes [place] fries and potato chips. Other Vinegar. Strong spices. This includes black pepper, white pepper, red pepper, cayenne, curry powder, cloves, ginger, and chili  powder. The items listed above may not be a complete list of foods and drinks to avoid. Contact your dietitian for more information.   This information is not intended to replace advice given to you by your health care provider. Make sure you discuss any questions you have with your health care provider.   Document Released: 07/15/2011 Document Revised: 02/03/2014 Document Reviewed: 11/17/2012 Elsevier Interactive Patient Education [employer].

## 2015-03-23 NOTE — Telephone Encounter (Signed)
Pt in to see Dr. Charlett Blake as advised.

## 2015-03-23 NOTE — Telephone Encounter (Signed)
[facility] - Client [place] [facility] Patient Name: Michael Johns DOB: Nov 29, 1932 Initial Comment Caller states he woke up and had a dull ache in his chest, nausea, and felt a little faint. Nurse Assessment Nurse: Markus Daft, RN, Sherre Poot Date/Time (Eastern Time): 03/23/2015 8:33:18 AM Confirm and document reason for call. If symptomatic, describe symptoms. You must click the next button to save text entered. ---Caller states he woke up around 3 am, and had a dull ache in middle of chest, clammy, nausea, and felt a little faint - little dizzy/shaky. Episode lasted 30 min. or so, and he fell back asleep. He woke up this AM feeling better, but doesn't feel normal or "right." Denies CP, dizziness/shaky, clamminess now. He has a little nausea. Has some discomfort around his esophagus only - not in chest. H/o GERD. Has the patient traveled out of the country within the last 30 days? ---Not Applicable Does the patient have any new or worsening symptoms? ---Yes Will a triage be completed? ---Yes Related visit to physician within the last 2 weeks? ---No Does the PT have any chronic conditions? (i.e. diabetes, asthma, etc.) ---Yes List chronic conditions. ---GERD - on prilosec, High Cholesterol - on Lipitor; HTN - on meds Is this a behavioral health or substance abuse call? ---No Guidelines Guideline Title Affirmed Question Affirmed Notes Chest Pain Chest pain lasts > 5 minutes (Exceptions: chest pain occurring > 3 days ago and now asymptomatic; same as previously diagnosed heartburn and has accompanying sour taste in mouth) Final Disposition User Go to ED Now (or PCP triage) Markus Daft, RN, Sherre Poot Comments No morning appts with Dr. Larose Kells. RN advised pt should be seen within next hr. Appt made with Dr. Randel Pigg for 10:15 am today instead. Pt aware that if he worsens in anyway to go right to ER. Referrals REFERRED TO PCP  OFFICE Disagree/Comply: Comply

## 2015-03-23 NOTE — Telephone Encounter (Signed)
Error  - opened in error 

## 2015-03-26 ENCOUNTER — Encounter: Payer: Self-pay | Admitting: Internal Medicine

## 2015-03-27 ENCOUNTER — Encounter: Payer: Self-pay | Admitting: Cardiology

## 2015-03-27 ENCOUNTER — Ambulatory Visit (INDEPENDENT_AMBULATORY_CARE_PROVIDER_SITE_OTHER): Payer: Medicare Other | Admitting: Cardiology

## 2015-03-27 VITALS — BP 122/68 | HR 68 | Ht 63.0 in | Wt 148.6 lb

## 2015-03-27 DIAGNOSIS — R079 Chest pain, unspecified: Secondary | ICD-10-CM

## 2015-03-27 NOTE — Progress Notes (Signed)
Cardiology Office Note   Date:  03/27/2015   ID:  Michael Johns, DOB 06/20/1932, MRN [id number]  PCP:  Kathlene November, MD  Cardiologist:   Hubert Raatz Meredith Leeds, MD    No chief complaint on file.    History of Present Illness: Michael Johns is a 82 y.o. male who presents today for cardiology evaluation.   He has a history of hypertension and hyperlipidemia.  He awoke on the morning of 2/24 with a dull ache in the middle of his chest.  Felt clammy, nausea, faint.  The episode lasted 30 minutes and he fell back asleep.  Woke the next morning feeling well.  Since that time, he has been doing well.  He says that prior to the episode of chest pain, he was having no chest pain or SOB.  He has been able to exercise without issues.  He says that he  Is not limited when he exercise, and is able to walk up to a mile anda half.He says that since he had the chest pain, he has been exercising without issues.  He plays golf and mows his lawn without issue.  He states that he has been able to do all of his activities without issues.  He did have a similar episode 10 years ago and had a negative stress test at that time.  Today, he denies symptoms of palpitations, chest pain, shortness of breath, orthopnea, PND, lower extremity edema, claudication, dizziness, presyncope, syncope, bleeding, or neurologic sequela. The patient is tolerating medications without difficulties and is otherwise without complaint today.    Past Medical History  Diagnosis Date  . HYPERTENSION 05/19/2006  . ADENOCARCINOMA, PROSTATE 07/01/2006  . [place], MILD 07/18/2009  . Gout     see 09-2008 note  . 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 Abdominal pain 11/09    due to constipation and ?pancreatitis- admitted  . GERD (gastroesophageal reflux disease)   . Constipation     chronic, impaction ~2012 (admited) and 01-2015   Past Surgical History  Procedure Laterality Date  . Appendectomy    . Tonsillectomy    . Prostatectomy   05/2007    LADS (-) Bone scan (-)  . Inguinal hernia repair Right 11/2007  . Rt middle ear surgery    . Colonoscopy    . Polypectomy    . Sinus polyp removed    . Cataracts Bilateral 2014     Current Outpatient Prescriptions  Medication Sig Dispense Refill  . amLODipine (NORVASC) 5 MG tablet Take 1 tablet (5 mg total) by mouth daily. 90 tablet 0  . amoxicillin (AMOXIL) 500 MG tablet Take 2 tablets twice daily for 10 days 40 tablet 0  . aspirin 81 MG tablet Take 81 mg by mouth daily.     Marland Kitchen atorvastatin (LIPITOR) 10 MG tablet Take 1 tablet (10 mg total) by mouth daily. 90 tablet 1  . clarithromycin (BIAXIN) 500 MG tablet Take 1 tablet (500 mg total) by mouth 2 (two) times daily. Take for 10 days 20 tablet 0  . Coenzyme Q10 (CO Q-10) 100 MG CAPS Take 1 capsule by mouth daily.    . hydrochlorothiazide (HYDRODIURIL) 12.5 MG tablet Take 1 tablet by mouth  daily 90 tablet 2  . lactulose (CHRONULAC) 10 GM/15ML solution Take 30 mLs (20 g total) by mouth daily as needed for mild constipation. 473 mL 1  . metoprolol succinate (TOPROL-XL) 25 MG 24 hr tablet Take 1 tablet (  25 mg total) by mouth daily. 90 tablet 0  . Multiple Vitamin (MULTIVITAMIN) tablet Take 1 tablet by mouth daily.      . nitroGLYCERIN (NITROSTAT) 0.4 MG SL tablet Place 1 tablet (0.4 mg total) under the tongue every 5 (five) minutes as needed for chest pain. 25 tablet 1  . Omega-3 Fatty Acids (FISH OIL PO) Take 1 tablet by mouth every other day.    Marland Kitchen omeprazole (PRILOSEC) 20 MG capsule Take 1 capsule (20 mg total) by mouth daily. 90 capsule 1  . omeprazole (PRILOSEC) 20 MG capsule Take 1 tablet twice daily for 10 days, then take once daily 60 capsule 0  . polyethylene glycol (MIRALAX / GLYCOLAX) packet Take 17 g by mouth as needed.      . ranitidine (ZANTAC) 300 MG tablet Take 1 tablet (300 mg total) by mouth at bedtime as needed for heartburn. 30 tablet 1  . senna (SENOKOT) 8.6 MG tablet Take 1 tablet by mouth as needed. Reported  on 02/19/2015     No current facility-administered medications for this visit.    Allergies:   Cephalexin   Social History:  The patient  reports that he has never smoked. He has never used smokeless tobacco. He reports that he drinks alcohol. He reports that he does not use illicit drugs.   Family History:  The patient's family history includes Cancer in his father and other; Diabetes in his mother; Esophageal cancer in his father; Heart disease in his maternal grandmother and mother; Hypertension in his mother; Leukemia in his maternal grandfather. There is no history of Prostate cancer, Colon cancer, Rectal cancer, or Stomach cancer.    ROS:  Please see the history of present illness.   Otherwise, review of systems is positive for chest pain, hearing loss, constipation.   All other systems are reviewed and negative.    PHYSICAL EXAM: VS:  There were no vitals taken for this visit. , BMI There is no weight on file to calculate BMI. GEN: Well nourished, well developed, in no acute distress HEENT: normal Neck: no JVD, carotid bruits, or masses Cardiac: RRR; no murmurs, rubs, or gallops,no edema  Respiratory:  clear to auscultation bilaterally, normal work of breathing GI: soft, nontender, nondistended, + BS MS: no deformity or atrophy Skin: warm and dry Neuro:  Strength and sensation are intact Psych: euthymic mood, full affect  EKG:  EKG is not ordered today. ECG 03/22/14 sinus rhythm, rate 62, no ischemic changes  Recent Labs: 03/23/2015: ALT 15; BUN 13; Creatinine, Ser 1.05; Hemoglobin 14.3; Magnesium 2.1; Platelets 286.0; Potassium 4.8; Sodium 127*; TSH 3.15    Lipid Panel     Component Value Date/Time   CHOL 152 03/23/2015 1130   TRIG 182.0* 03/23/2015 1130   HDL 49.60 03/23/2015 1130   CHOLHDL 3 03/23/2015 1130   VLDL 36.4 03/23/2015 1130   LDLCALC 66 03/23/2015 1130   LDLDIRECT 150.2 04/16/2011 0911     Wt Readings from Last 3 Encounters:  03/23/15 152 lb 2 oz  (69.003 kg)  02/19/15 150 lb (68.04 kg)  02/12/15 145 lb (65.772 kg)     ASSESSMENT AND PLAN:  1.  Chest pain: pain is atypical for cardiac pain.  It occurred with rest.  He has been able to exercise without recurrence of the pain as well.  His pain sounds to be GI in nature as he says that it is similar to his prior symptoms of GERD.  At this time, would not recommend any  further cardiac testing. I have encouraged Mr. Borello to continue to exercise.  Lesley Atkin follow up as needed and if he has more pain, would consider stress testing at that time.    Current medicines are reviewed at length with the patient today.   The patient does not have concerns regarding his medicines.  The following changes were made today:  none  Labs/ tests ordered today include:  No orders of the defined types were placed in this encounter.     Disposition:   FU with Kamau Weatherall PRN  Signed, Joy Reiger Meredith Leeds, MD  03/27/2015 8:10 AM     [facility] [address] [place] [address] [phone] (office) [phone] (fax)

## 2015-03-27 NOTE — Patient Instructions (Signed)
Medication Instructions:  Your physician recommends that you continue on your current medications as directed. Please refer to the Current Medication list given to you today.  Labwork: None ordered  Testing/Procedures: None ordered  Follow-Up: No follow up is needed at this time with [facility].  We will see you on an as needed basis.  If you need a refill on your cardiac medications before your next appointment, please call your pharmacy.  Thank you for choosing [facility]!!

## 2015-04-01 ENCOUNTER — Encounter: Payer: Self-pay | Admitting: Family Medicine

## 2015-04-01 DIAGNOSIS — R0789 Other chest pain: Secondary | ICD-10-CM

## 2015-04-01 HISTORY — DX: Other chest pain: R07.89

## 2015-04-01 NOTE — Assessment & Plan Note (Addendum)
EKG unremarkable. Testing confirms H Pylori, is started on Amoxicillin, biaxin and Omeprazole bid, start probiotics and report worsening symptoms. Before restuls have returned patient agrees to referral to cardiology due to risk factors.

## 2015-04-01 NOTE — Progress Notes (Signed)
Patient ID: Michael Johns, male   DOB: [birth year], 82 y.o.   MRN: [id number]   Subjective:    Patient ID: Michael Johns Seeney, male    DOB: 1932/06/06, 82 y.o.   MRN: [id number]  Chief Complaint  Patient presents with  . Chest Pain  . Nausea    HPI Patient is in today for evaluation of chest pain. He woke up in middle of night with chest discomfort he describes of a dullache. Also endorses nausea and reflux, no vomiting. Denies any diaphoresis or SOB. Acknowledges recent fatigue and congestion. He notes he has been struggling with head congestion for about a week but these symptoms are impvoing. He also notes some mild constipation. Taking Miralax with decent results most days although he does need Lactulose every  So often to move bowels. No bloody or tarry stool. No fevers or chills.   Past Medical History  Diagnosis Date  . HYPERTENSION 05/19/2006  . ADENOCARCINOMA, PROSTATE 07/01/2006  . [place], MILD 07/18/2009  . Gout     see 09-2008 note  . 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 Abdominal pain 11/09    due to constipation and ?pancreatitis- admitted  . GERD (gastroesophageal reflux disease)   . Constipation     chronic, impaction ~2012 (admited) and 01-2015  . Atypical chest pain 04/01/2015    Past Surgical History  Procedure Laterality Date  . Appendectomy    . Tonsillectomy    . Prostatectomy  05/2007    LADS (-) Bone scan (-)  . Inguinal hernia repair Right 11/2007  . Rt middle ear surgery    . Colonoscopy    . Polypectomy    . Sinus polyp removed    . Cataracts Bilateral 2014    Family History  Problem Relation Age of Onset  . Hypertension Mother   . Diabetes Mother   . Heart disease Mother     CABG 32 y/o  . Esophageal cancer Father   . Cancer Father   . Heart disease Maternal Grandmother     MI  . Prostate cancer Neg Hx   . Colon cancer Neg Hx   . Rectal cancer Neg Hx   . Stomach cancer Neg Hx   . Leukemia Maternal Grandfather   . Cancer Other    all paternal uncles-types unknown    Social History   Social History  . Marital Status: Married    Spouse Name: N/A  . Number of Children: 0  . Years of Education: N/A   Occupational History  . retired     Social History Main Topics  . Smoking status: Never Smoker   . Smokeless tobacco: Never Used  . Alcohol Use: Yes     Comment: socially   . Drug Use: No  . Sexual Activity: Not on file   Other Topics Concern  . Not on file   Social History Narrative   Re-married, no children, Joycelyn Schmid , present wife has 3 daughters      retired-- worked for the [employer] Prior to Visit  Medication Sig Dispense Refill  . amLODipine (NORVASC) 5 MG tablet Take 1 tablet (5 mg total) by mouth daily. 90 tablet 0  . aspirin 81 MG tablet Take 81 mg by mouth daily.     Marland Kitchen atorvastatin (LIPITOR) 10 MG tablet Take 1 tablet (10 mg total) by mouth daily. 90 tablet 1  . Coenzyme Q10 (CO Q-10) 100 MG CAPS  Take 1 capsule by mouth daily.    . hydrochlorothiazide (HYDRODIURIL) 12.5 MG tablet Take 1 tablet by mouth  daily 90 tablet 2  . lactulose (CHRONULAC) 10 GM/15ML solution Take 30 mLs (20 g total) by mouth daily as needed for mild constipation. 473 mL 1  . metoprolol succinate (TOPROL-XL) 25 MG 24 hr tablet Take 1 tablet (25 mg total) by mouth daily. 90 tablet 0  . Multiple Vitamin (MULTIVITAMIN) tablet Take 1 tablet by mouth daily.      . Omega-3 Fatty Acids (FISH OIL PO) Take 1 tablet by mouth every other day.    Marland Kitchen omeprazole (PRILOSEC) 20 MG capsule Take 1 capsule (20 mg total) by mouth daily. 90 capsule 1  . polyethylene glycol (MIRALAX / GLYCOLAX) packet Take 17 g by mouth as needed.      . senna (SENOKOT) 8.6 MG tablet Take 1 tablet by mouth as needed. Reported on 02/19/2015     No facility-administered medications prior to visit.    Allergies  Allergen Reactions  . Cephalexin Hives    Review of Systems  Constitutional: Positive for malaise/fatigue. Negative for  fever.  HENT: Positive for congestion.   Eyes: Negative for discharge.  Respiratory: Negative for shortness of breath.   Cardiovascular: Positive for chest pain. Negative for palpitations and leg swelling.  Gastrointestinal: Positive for heartburn and nausea. Negative for abdominal pain.  Genitourinary: Negative for dysuria.  Musculoskeletal: Negative for falls.  Skin: Negative for rash.  Neurological: Negative for loss of consciousness and headaches.  Endo/Heme/Allergies: Negative for environmental allergies.  Psychiatric/Behavioral: Negative for depression. The patient is not nervous/anxious.        Objective:    Physical Exam  Constitutional: He is oriented to person, place, and time. He appears well-developed and well-nourished. No distress.  HENT:  Head: Normocephalic and atraumatic.  Nose: Nose normal.  Eyes: Right eye exhibits no discharge. Left eye exhibits no discharge.  Neck: Normal range of motion. Neck supple.  Cardiovascular: Normal rate and regular rhythm.   No murmur heard. Pulmonary/Chest: Effort normal and breath sounds normal.  Abdominal: Soft. Bowel sounds are normal. There is no tenderness.  Musculoskeletal: He exhibits no edema.  Neurological: He is alert and oriented to person, place, and time.  Skin: Skin is warm and dry.  Psychiatric: He has a normal mood and affect.  Nursing note and vitals reviewed.   BP 125/62 mmHg  Pulse 66  Temp(Src) 98.7 F (37.1 C) (Oral)  Ht 5\' 3"  (1.6 m)  Wt 152 lb 2 oz (69.003 kg)  BMI 26.95 kg/m2  SpO2 100% Wt Readings from Last 3 Encounters:  03/27/15 148 lb 9.6 oz (67.405 kg)  03/23/15 152 lb 2 oz (69.003 kg)  02/19/15 150 lb (68.04 kg)     Lab Results  Component Value Date   WBC 12.3* 03/23/2015   HGB 14.3 03/23/2015   HCT 40.9 03/23/2015   PLT 286.0 03/23/2015   GLUCOSE 97 03/23/2015   CHOL 152 03/23/2015   TRIG 182.0* 03/23/2015   HDL 49.60 03/23/2015   LDLDIRECT 150.2 04/16/2011   LDLCALC 66  03/23/2015   ALT 15 03/23/2015   AST 22 03/23/2015   NA 127* 03/23/2015   K 4.8 03/23/2015   CL 93* 03/23/2015   CREATININE 1.05 03/23/2015   BUN 13 03/23/2015   CO2 31 03/23/2015   TSH 3.15 03/23/2015   PSA 0.01* 05/10/2014    Lab Results  Component Value Date   TSH 3.15 03/23/2015  Lab Results  Component Value Date   WBC 12.3* 03/23/2015   HGB 14.3 03/23/2015   HCT 40.9 03/23/2015   MCV 87.9 03/23/2015   PLT 286.0 03/23/2015   Lab Results  Component Value Date   NA 127* 03/23/2015   K 4.8 03/23/2015   CO2 31 03/23/2015   GLUCOSE 97 03/23/2015   BUN 13 03/23/2015   CREATININE 1.05 03/23/2015   BILITOT 0.6 03/23/2015   ALKPHOS 67 03/23/2015   AST 22 03/23/2015   ALT 15 03/23/2015   PROT 7.6 03/23/2015   ALBUMIN 4.2 03/23/2015   CALCIUM 9.4 03/23/2015   GFR 72.06 03/23/2015   Lab Results  Component Value Date   CHOL 152 03/23/2015   Lab Results  Component Value Date   HDL 49.60 03/23/2015   Lab Results  Component Value Date   LDLCALC 66 03/23/2015   Lab Results  Component Value Date   TRIG 182.0* 03/23/2015   Lab Results  Component Value Date   CHOLHDL 3 03/23/2015   No results found for: HGBA1C     Assessment & Plan:   Problem List Items Addressed This Visit    Atypical chest pain    EKG unremarkable. Testing confirms H Pylori, is started on Amoxicillin, biaxin and Omeprazole bid, start probiotics and report worsening symptoms. Before restuls have returned patient agrees to referral to cardiology due to risk factors.      Essential hypertension    Well controlled, no changes to meds. Encouraged heart healthy diet such as the DASH diet and exercise as tolerated.       Relevant Medications   nitroGLYCERIN (NITROSTAT) 0.4 MG SL tablet   ranitidine (ZANTAC) 300 MG tablet   Other Relevant Orders   Lipid panel (Completed)   Comprehensive metabolic panel (Completed)   TSH (Completed)   CBC (Completed)   Magnesium (Completed)   Ambulatory  referral to Cardiology    Other Visit Diagnoses    Other chest pain    -  Primary    Relevant Medications    nitroGLYCERIN (NITROSTAT) 0.4 MG SL tablet    ranitidine (ZANTAC) 300 MG tablet    Other Relevant Orders    EKG 12-Lead (Completed)    Lipid panel (Completed)    Comprehensive metabolic panel (Completed)    TSH (Completed)    CBC (Completed)    Magnesium (Completed)    Ambulatory referral to Cardiology    Troponin I (Completed)    H. pylori antibody, IgG (Completed)    Gastroesophageal reflux disease, esophagitis presence not specified        Relevant Medications    nitroGLYCERIN (NITROSTAT) 0.4 MG SL tablet    ranitidine (ZANTAC) 300 MG tablet    Other Relevant Orders    Lipid panel (Completed)    Comprehensive metabolic panel (Completed)    TSH (Completed)    CBC (Completed)    Magnesium (Completed)    Ambulatory referral to Cardiology    H. pylori antibody, IgG (Completed)    Hyperlipidemia, mixed        Relevant Medications    nitroGLYCERIN (NITROSTAT) 0.4 MG SL tablet    ranitidine (ZANTAC) 300 MG tablet    Other Relevant Orders    Lipid panel (Completed)    Comprehensive metabolic panel (Completed)    TSH (Completed)    CBC (Completed)    Magnesium (Completed)    Ambulatory referral to Cardiology    Dyspepsia        Relevant Medications  nitroGLYCERIN (NITROSTAT) 0.4 MG SL tablet    ranitidine (ZANTAC) 300 MG tablet    Other Relevant Orders    Lipid panel (Completed)    Comprehensive metabolic panel (Completed)    TSH (Completed)    CBC (Completed)    Magnesium (Completed)    Ambulatory referral to Cardiology    H. pylori antibody, IgG (Completed)       I am having Mr. Hubanks start on nitroGLYCERIN and ranitidine. I am also having him maintain his multivitamin, aspirin, polyethylene glycol, senna, Omega-3 Fatty Acids (FISH OIL PO), Co Q-10, hydrochlorothiazide, omeprazole, atorvastatin, amLODipine, metoprolol succinate, and lactulose.  Meds  ordered this encounter  Medications  . nitroGLYCERIN (NITROSTAT) 0.4 MG SL tablet    Sig: Place 1 tablet (0.4 mg total) under the tongue every 5 (five) minutes as needed for chest pain.    Dispense:  25 tablet    Refill:  1  . ranitidine (ZANTAC) 300 MG tablet    Sig: Take 1 tablet (300 mg total) by mouth at bedtime as needed for heartburn.    Dispense:  30 tablet    Refill:  1     Penni Homans, MD

## 2015-04-01 NOTE — Assessment & Plan Note (Signed)
Well controlled, no changes to meds. Encouraged heart healthy diet such as the DASH diet and exercise as tolerated.  °

## 2015-04-04 ENCOUNTER — Encounter: Payer: Self-pay | Admitting: Internal Medicine

## 2015-04-08 ENCOUNTER — Other Ambulatory Visit: Payer: Self-pay | Admitting: Internal Medicine

## 2015-04-09 ENCOUNTER — Encounter: Payer: Self-pay | Admitting: Internal Medicine

## 2015-04-09 ENCOUNTER — Other Ambulatory Visit: Payer: Self-pay

## 2015-04-11 ENCOUNTER — Ambulatory Visit (INDEPENDENT_AMBULATORY_CARE_PROVIDER_SITE_OTHER): Payer: Medicare Other | Admitting: Internal Medicine

## 2015-04-11 ENCOUNTER — Encounter: Payer: Self-pay | Admitting: Internal Medicine

## 2015-04-11 VITALS — BP 126/58 | HR 69 | Temp 97.7°F | Ht 63.0 in | Wt 150.4 lb

## 2015-04-11 DIAGNOSIS — E782 Mixed hyperlipidemia: Secondary | ICD-10-CM

## 2015-04-11 DIAGNOSIS — R252 Cramp and spasm: Secondary | ICD-10-CM | POA: Diagnosis not present

## 2015-04-11 DIAGNOSIS — R0789 Other chest pain: Secondary | ICD-10-CM

## 2015-04-11 DIAGNOSIS — E785 Hyperlipidemia, unspecified: Secondary | ICD-10-CM

## 2015-04-11 MED ORDER — METHOCARBAMOL 500 MG PO TABS: 500.0000 mg | ORAL_TABLET | Freq: Every evening | ORAL | Status: DC | PRN

## 2015-04-11 NOTE — Progress Notes (Signed)
Subjective:    Patient ID: Michael Johns, male    DOB: 1932/02/10, 82 y.o.   MRN: [id number]  DOS:  04/11/2015 Type of visit - description : Follow-up Interval history: Was seen last month with chest pain, chart reviewed, was H. pylori positive, status post treatment, feeling much better. Saw cardiology, no further testing recommended. Hyperlipidemia: Still concerned about taking Lipitor Multiple questions about vitamin supplements   Review of Systems   Past Medical History  Diagnosis Date  . HYPERTENSION 05/19/2006  . ADENOCARCINOMA, PROSTATE 07/01/2006  . [place], MILD 07/18/2009  . Gout     see 09-2008 note  . 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 Abdominal pain 11/09    due to constipation and ?pancreatitis- admitted  . GERD (gastroesophageal reflux disease)   . Constipation     chronic, impaction ~2012 (admited) and 01-2015  . Atypical chest pain 04/01/2015    Past Surgical History  Procedure Laterality Date  . Appendectomy    . Tonsillectomy    . Prostatectomy  05/2007    LADS (-) Bone scan (-)  . Inguinal hernia repair Right 11/2007  . Rt middle ear surgery    . Colonoscopy    . Polypectomy    . Sinus polyp removed    . Cataracts Bilateral 2014    Social History   Social History  . Marital Status: Married    Spouse Name: N/A  . Number of Children: 0  . Years of Education: N/A   Occupational History  . retired     Social History Main Topics  . Smoking status: Never Smoker   . Smokeless tobacco: Never Used  . Alcohol Use: Yes     Comment: socially   . Drug Use: No  . Sexual Activity: Not on file   Other Topics Concern  . Not on file   Social History Narrative   Re-married, no children, Joycelyn Schmid , present wife has 3 daughters      retired-- worked for the [employer]        Medication List       This list is accurate as of: 04/11/15  2:01 PM.  Always use your most recent med list.               amLODipine 5 MG tablet  Commonly  known as:  NORVASC  Take 1 tablet (5 mg total) by mouth daily.     aspirin 81 MG tablet  Take 81 mg by mouth daily.     atorvastatin 10 MG tablet  Commonly known as:  LIPITOR  Take 1 tablet (10 mg total) by mouth daily.     hydrochlorothiazide 12.5 MG tablet  Commonly known as:  HYDRODIURIL  Take 1 tablet (12.5 mg total) by mouth daily.     lactulose 10 GM/15ML solution  Commonly known as:  CHRONULAC  Take 30 mLs (20 g total) by mouth daily as needed for mild constipation.     methocarbamol 500 MG tablet  Commonly known as:  ROBAXIN  Take 1 tablet (500 mg total) by mouth at bedtime as needed for muscle spasms (cramps).     metoprolol succinate 25 MG 24 hr tablet  Commonly known as:  TOPROL-XL  Take 1 tablet (25 mg total) by mouth daily.     multivitamin tablet  Take 1 tablet by mouth daily.     omeprazole 20 MG capsule  Commonly known as:  PRILOSEC  Take 1 capsule (  20 mg total) by mouth daily.     polyethylene glycol packet  Commonly known as:  MIRALAX / GLYCOLAX  Take 17 g by mouth as needed.     senna 8.6 MG tablet  Commonly known as:  SENOKOT  Take 1 tablet by mouth as needed. Reported on 02/19/2015           Objective:   Physical Exam BP 126/58 mmHg  Pulse 69  Temp(Src) 97.7 F (36.5 C) (Oral)  Ht 5\' 3"  (1.6 m)  Wt 150 lb 6 oz (68.21 kg)  BMI 26.64 kg/m2  SpO2 97% General:   Well developed, well nourished . NAD.  HEENT:  Normocephalic . Face symmetric, atraumatic Lungs:  CTA B Normal respiratory effort, no intercostal retractions, no accessory muscle use. Heart: RRR,  no murmur.  No pretibial edema bilaterally  Skin: Not pale. Not jaundice Neurologic:  alert & oriented X3.  Speech normal, gait appropriate for age and unassisted Psych--  Cognition and judgment appear intact.  Cooperative with normal attention span and concentration.  Behavior appropriate. No anxious or depressed appearing.      Assessment & Plan:    Assessment> HTN Hyperlipidemia Leg cramps-- lipitor d/c in the past, cramps decreased, back on lipitor as cramps are  mild and benefits > risks. CoQ10 helps, CKs normal. rx robaxin 03-2015 Prostate cancer 2008 DJD GERD,  Dysphagia-- Rx  EGD 02-2014 (declined,sx chronic, mild) Chronic mild constipation, fecal impaction 2012 (admited per pt) and 01-2015 (ER): on MiraLAX qd, senokot or lactulose prn HOH    PLAN Chest pain: had CP last month, H Pylori +, s/p treatment, now asx. Saw cards, no further eval needed H pylori +: treated, recheck breath test on RTC Hyperlipidemia: again pt somewhat concerned about statins, he realizes that leg cramps are not likely related to lipitor, we agreed to cont on statins Leg cramps: at night, sporadic, rx robaxin prn Vitamin supplements: To be sure he has all vitamins he needs, rec a healthy diet, one multivitamin daily and vitamin D 600 units. RTC ---April/may 2017, CPX, fasting  F2F>> 15

## 2015-04-11 NOTE — Progress Notes (Signed)
Pre visit review using our clinic review tool, if applicable. No additional management support is needed unless otherwise documented below in the visit note. 

## 2015-04-11 NOTE — Patient Instructions (Signed)
GO TO THE FRONT DESK Schedule your next appointment for a  Physical exam When?   By April or may  Fasting?  Yes

## 2015-04-11 NOTE — Assessment & Plan Note (Signed)
Chest pain: had CP last month, H Pylori +, s/p treatment, now asx. Saw cards, no further eval needed H pylori +: treated, recheck breath test on RTC Hyperlipidemia: again pt somewhat concerned about statins, he realizes that leg cramps are not likely related to lipitor, we agreed to cont on statins Leg cramps: at night, sporadic, rx robaxin prn Vitamin supplements: To be sure he has all vitamins he needs, rec a healthy diet, one multivitamin daily and vitamin D 600 units. RTC ---April/may 2017, CPX, fasting

## 2015-05-15 ENCOUNTER — Ambulatory Visit: Payer: Medicare Other | Admitting: Internal Medicine

## 2015-05-21 ENCOUNTER — Encounter: Payer: Self-pay | Admitting: Internal Medicine

## 2015-06-15 ENCOUNTER — Telehealth: Payer: Self-pay | Admitting: Behavioral Health

## 2015-06-15 ENCOUNTER — Encounter: Payer: Self-pay | Admitting: Behavioral Health

## 2015-06-15 NOTE — Telephone Encounter (Signed)
Unable to reach patient at time of Pre-Visit Call.  Left message for patient to return call when available.    

## 2015-06-15 NOTE — Addendum Note (Signed)
Addended by: Eduard Roux E on: 06/15/2015 02:53 PM   Modules accepted: Medications

## 2015-06-15 NOTE — Telephone Encounter (Signed)
Pre-Visit Call completed with patient and chart updated.   Pre-Visit Info documented in Specialty Comments under [employer].    

## 2015-06-18 ENCOUNTER — Encounter: Payer: Self-pay | Admitting: Internal Medicine

## 2015-06-18 ENCOUNTER — Ambulatory Visit (INDEPENDENT_AMBULATORY_CARE_PROVIDER_SITE_OTHER): Payer: Medicare Other | Admitting: Internal Medicine

## 2015-06-18 VITALS — BP 118/74 | HR 58 | Temp 98.2°F | Ht 63.0 in | Wt 149.4 lb

## 2015-06-18 DIAGNOSIS — E785 Hyperlipidemia, unspecified: Secondary | ICD-10-CM | POA: Diagnosis not present

## 2015-06-18 DIAGNOSIS — Z0000 Encounter for general adult medical examination without abnormal findings: Secondary | ICD-10-CM | POA: Diagnosis not present

## 2015-06-18 DIAGNOSIS — Z8546 Personal history of malignant neoplasm of prostate: Secondary | ICD-10-CM

## 2015-06-18 DIAGNOSIS — Z09 Encounter for follow-up examination after completed treatment for conditions other than malignant neoplasm: Secondary | ICD-10-CM

## 2015-06-18 DIAGNOSIS — I10 Essential (primary) hypertension: Secondary | ICD-10-CM | POA: Diagnosis not present

## 2015-06-18 DIAGNOSIS — Z8619 Personal history of other infectious and parasitic diseases: Secondary | ICD-10-CM | POA: Diagnosis not present

## 2015-06-18 LAB — BASIC METABOLIC PANEL
BUN: 11 mg/dL (ref 6–23)
CALCIUM: 9.1 mg/dL (ref 8.4–10.5)
CO2: 29 meq/L (ref 19–32)
Chloride: 97 mEq/L (ref 96–112)
Creatinine, Ser: 1.02 mg/dL (ref 0.40–1.50)
GFR: 74.47 mL/min (ref >60.00)
GLUCOSE: 96 mg/dL (ref 70–99)
POTASSIUM: 4.2 meq/L (ref 3.5–5.1)
SODIUM: 132 meq/L — AB (ref 135–145)

## 2015-06-18 LAB — PSA: PSA: 0 ng/mL — ABNORMAL LOW (ref 0.10–4.00)

## 2015-06-18 LAB — CBC WITH DIFFERENTIAL/PLATELET
Basophils Absolute: 0 10*3/uL (ref 0.0–0.1)
Basophils Relative: 0.5 % (ref 0.0–3.0)
EOS PCT: 3.8 % (ref 0.0–5.0)
Eosinophils Absolute: 0.3 10*3/uL (ref 0.0–0.7)
HCT: 40.9 % (ref 39.0–52.0)
Hemoglobin: 13.8 g/dL (ref 13.0–17.0)
LYMPHS ABS: 2.8 10*3/uL (ref 0.7–4.0)
Lymphocytes Relative: 33.6 % (ref 12.0–46.0)
MCHC: 33.9 g/dL (ref 30.0–36.0)
MCV: 89.8 fl (ref 78.0–100.0)
MONO ABS: 1 10*3/uL (ref 0.1–1.0)
MONOS PCT: 12.5 % — AB (ref 3.0–12.0)
NEUTROS ABS: 4.1 10*3/uL (ref 1.4–7.7)
NEUTROS PCT: 49.6 % (ref 43.0–77.0)
PLATELETS: 276 10*3/uL (ref 150.0–400.0)
RBC: 4.55 Mil/uL (ref 4.22–5.81)
RDW: 14.4 % (ref 11.5–15.5)
WBC: 8.2 10*3/uL (ref 4.0–10.5)

## 2015-06-18 LAB — TSH: TSH: 3.77 u[IU]/mL (ref 0.35–4.50)

## 2015-06-18 NOTE — Assessment & Plan Note (Signed)
Td 2013 pneumonia shot 2009 prevnar 2015 shingles shot 2-09  Cscope 10-08, Adenomatous polyp, repeated cscope again 03-2011 Normal, was rec no f/u  Diet and exercise discussed. He is doing well    

## 2015-06-18 NOTE — Assessment & Plan Note (Signed)
HTN: Continue amlodipine, Toprol, HCTZ, last sodium slightly low, check a BMP Hyperlipidemia: On Lipitor, recent FLP satisfactory, no side effects at this point H/o prostate cancer: Check a PSA H. pylori: Recheck breathing test came back positive, he was prescribed amoxicillin and Biaxin. Plan: Hold PPIs for 10 days, check breathing test in 10 days. RTC 6 months

## 2015-06-18 NOTE — Progress Notes (Signed)
Pre visit review using our clinic review tool, if applicable. No additional management support is needed unless otherwise documented below in the visit note. 

## 2015-06-18 NOTE — Progress Notes (Signed)
Subjective:    Patient ID: Michael Johns, male    DOB: 1932-04-03, 82 y.o.   MRN: [id number]  DOS:  06/18/2015 Type of visit - description :   Here for Medicare AWV: 1. Risk factors based on Past M, S, F history: reviewed   2. Physical Activities: yard work, home chores, Psychologist, occupational,  plays golf 3. Depression/mood: neg screen  4. Hearing: R hear decreased > 40% x years , uses a hearing aid on-off @ the R . No problems @ normal  conversation   5. ADL's: independent  , drives 6. Fall Risk: no recent falls, prevention discussed    7. Home Safety: does feels safe at home   8. Height, weight, &visual acuity: see VS, rec to see eye doctor q year   31.   Counseling: yes   10. Labs ordered based on risk factors: yes 11. Referral Coordination, if needed 12. Care Plan, see assessment and plan, written plan provided   13.  Cognitive Assessment: cognition & motor skills seem appropriate. Good memory 14. Care team updated 15.  Pt brought his HCPOA  in addition, we discussed the following HTN: Good compliance of medication, ambulatory BPs in the 120s High cholesterol: On statins, at some point had aches and pains that is essentially gone  Was seen with a GI symptoms the last time, symptoms resolved History of prostate cancer: Request a PSA Doing great with diet and exercise, has lost a few pounds  Review of Systems  Constitutional: No fever. No chills. No unexplained wt changes. No unusual sweats  HEENT: No dental problems, no ear discharge, no facial swelling, no voice changes. No eye discharge, no eye  redness , no  intolerance to light   Respiratory: No wheezing , no  difficulty breathing. No cough , no mucus production  Cardiovascular: No CP, no leg swelling , no  Palpitations  GI: no nausea, no vomiting, no diarrhea , no  abdominal pain.  No blood in the stools. No dysphagia, no odynophagia    Endocrine: No polyphagia, no polyuria , no polydipsia  GU: No dysuria, gross hematuria,  difficulty urinating. occ urinary and bowel incontinence.  Musculoskeletal: No joint swellings or unusual aches or pains  Skin: No change in the color of the skin, palor , no  Rash  Allergic, immunologic: No environmental allergies , no  food allergies  Neurological: No dizziness no  syncope. No headaches. No diplopia, no slurred, no slurred speech, no motor deficits, no facial  Numbness  Hematological: No enlarged lymph nodes, no easy bruising , no unusual bleedings  Psychiatry: No suicidal ideas, no hallucinations, no beavior problems, no confusion.  No unusual/severe anxiety, no depression   Past Medical History  Diagnosis Date  . HYPERTENSION 05/19/2006  . ADENOCARCINOMA, PROSTATE 07/01/2006  . [place], MILD 07/18/2009  . Gout     see 09-2008 note  . 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 Abdominal pain 11/09    due to constipation and ?pancreatitis- admitted  . GERD (gastroesophageal reflux disease)   . Constipation     chronic, impaction ~2012 (admited) and 01-2015  . Atypical chest pain 04/01/2015    Past Surgical History  Procedure Laterality Date  . Appendectomy    . Tonsillectomy    . Prostatectomy  05/2007    LADS (-) Bone scan (-)  . Inguinal hernia repair Right 11/2007  . Rt middle ear surgery    . Colonoscopy    .  Polypectomy    . Sinus polyp removed    . Cataracts Bilateral 2014    Social History   Social History  . Marital Status: Married    Spouse Name: N/A  . Number of Children: 0  . Years of Education: N/A   Occupational History  . retired     Social History Main Topics  . Smoking status: Never Smoker   . Smokeless tobacco: Never Used  . Alcohol Use: Yes     Comment: socially   . Drug Use: No  . Sexual Activity: Not on file   Other Topics Concern  . Not on file   Social History Narrative   Re-married, no children, Joycelyn Schmid , present wife has 3 daughters      retired-- worked for the [employer]         Medication List        This list is accurate as of: 06/18/15 12:07 PM.  Always use your most recent med list.               amLODipine 5 MG tablet  Commonly known as:  NORVASC  Take 1 tablet (5 mg total) by mouth daily.     aspirin 81 MG tablet  Take 81 mg by mouth daily.     atorvastatin 10 MG tablet  Commonly known as:  LIPITOR  Take 1 tablet (10 mg total) by mouth daily.     Co-Enzyme Q10 200 MG Caps  Take 1 capsule by mouth daily.     hydrochlorothiazide 12.5 MG tablet  Commonly known as:  HYDRODIURIL  Take 1 tablet (12.5 mg total) by mouth daily.     lactulose 10 GM/15ML solution  Commonly known as:  CHRONULAC  Take 30 mLs (20 g total) by mouth daily as needed for mild constipation.     methocarbamol 500 MG tablet  Commonly known as:  ROBAXIN  Take 1 tablet (500 mg total) by mouth at bedtime as needed for muscle spasms (cramps).     metoprolol succinate 25 MG 24 hr tablet  Commonly known as:  TOPROL-XL  Take 1 tablet (25 mg total) by mouth daily.     multivitamin tablet  Take 1 tablet by mouth daily.     omeprazole 20 MG capsule  Commonly known as:  PRILOSEC  Take 1 capsule (20 mg total) by mouth daily.     polyethylene glycol packet  Commonly known as:  MIRALAX / GLYCOLAX  Take 17 g by mouth as needed. Reported on 06/15/2015     PROBIOTIC-10 PO  Take 1 capsule by mouth daily.     senna 8.6 MG tablet  Commonly known as:  SENOKOT  Take 1 tablet by mouth as needed. Reported on 06/15/2015           Objective:   Physical Exam BP 118/74 mmHg  Pulse 58  Temp(Src) 98.2 F (36.8 C) (Oral)  Ht 5\' 3"  (1.6 m)  Wt 149 lb 6 oz (67.756 kg)  BMI 26.47 kg/m2  SpO2 99%  General:   Well developed, well nourished . NAD.  Neck: No  thyromegaly  HEENT:  Normocephalic . Face symmetric, atraumatic Lungs:  CTA B Normal respiratory effort, no intercostal retractions, no accessory muscle use. Heart: RRR,  no murmur.  No pretibial edema bilaterally  Abdomen:  Not distended, soft,  non-tender. No rebound or rigidity.   Skin: Exposed areas without rash. Not pale. Not jaundice Neurologic:  alert & oriented X3.  Speech normal, gait  appropriate for age and unassisted Strength symmetric and appropriate for age.  Psych: Cognition and judgment appear intact.  Cooperative with normal attention span and concentration.  Behavior appropriate. No anxious or depressed appearing.    Assessment & Plan:   Assessment> HTN Hyperlipidemia Leg cramps-- lipitor d/c in the past, cramps decreased, back on lipitor as cramps are  mild and benefits > risks. CoQ10 helps, CKs normal. rx robaxin 03-2015 Prostate cancer-- prostatectomy 05-2007, urology stopped f/u PSA checked by PCP DJD GERD,  Dysphagia-- Rx  EGD 02-2014 (declined,sx chronic, mild) Chronic mild constipation, fecal impaction 2012 (admited per pt) and 01-2015 (ER): on MiraLAX qd, senokot or lactulose prn HOH   PLAN: HTN: Continue amlodipine, Toprol, HCTZ, last sodium slightly low, check a BMP Hyperlipidemia: On Lipitor, recent FLP satisfactory, no side effects at this point H/o prostate cancer: Check a PSA H. pylori: Recheck breathing test came back positive, he was prescribed amoxicillin and Biaxin. Plan: Hold PPIs for 10 days, check breathing test in 10 days. RTC 6 months

## 2015-06-18 NOTE — Patient Instructions (Signed)
GO TO THE LAB : Get the blood work     GO TO THE FRONT DESK Schedule your next appointment for a  follow-up in 6 months    Check the  blood pressure 2 or 3 times a month  Be sure your blood pressure is between 110/65 and  145/85. If it is consistently higher or lower, let me know  '   Fall Prevention and Home Safety Falls cause injuries and can affect all age groups. It is possible to use preventive measures to significantly decrease the likelihood of falls. There are many simple measures which can make your home safer and prevent falls. OUTDOORS  Repair cracks and edges of walkways and driveways.  Remove high doorway thresholds.  Trim shrubbery on the main path into your home.  Have good outside lighting.  Clear walkways of tools, rocks, debris, and clutter.  Check that handrails are not broken and are securely fastened. Both sides of steps should have handrails.  Have leaves, snow, and ice cleared regularly.  Use sand or salt on walkways during winter months.  In the garage, clean up grease or oil spills. BATHROOM  Install night lights.  Install grab bars by the toilet and in the tub and shower.  Use non-skid mats or decals in the tub or shower.  Place a plastic non-slip stool in the shower to sit on, if needed.  Keep floors dry and clean up all water on the floor immediately.  Remove soap buildup in the tub or shower on a regular basis.  Secure bath mats with non-slip, double-sided rug tape.  Remove throw rugs and tripping hazards from the floors. BEDROOMS  Install night lights.  Make sure a bedside light is easy to reach.  Do not use oversized bedding.  Keep a telephone by your bedside.  Have a firm chair with side arms to use for getting dressed.  Remove throw rugs and tripping hazards from the floor. KITCHEN  Keep handles on pots and pans turned toward the center of the stove. Use back burners when possible.  Clean up spills quickly and  allow time for drying.  Avoid walking on wet floors.  Avoid hot utensils and knives.  Position shelves so they are not too high or low.  Place commonly used objects within easy reach.  If necessary, use a sturdy step stool with a grab bar when reaching.  Keep electrical cables out of the way.  Do not use floor polish or wax that makes floors slippery. If you must use wax, use non-skid floor wax.  Remove throw rugs and tripping hazards from the floor. STAIRWAYS  Never leave objects on stairs.  Place handrails on both sides of stairways and use them. Fix any loose handrails. Make sure handrails on both sides of the stairways are as long as the stairs.  Check carpeting to make sure it is firmly attached along stairs. Make repairs to worn or loose carpet promptly.  Avoid placing throw rugs at the top or bottom of stairways, or properly secure the rug with carpet tape to prevent slippage. Get rid of throw rugs, if possible.  Have an electrician put in a light switch at the top and bottom of the stairs. OTHER FALL PREVENTION TIPS  Wear low-heel or rubber-soled shoes that are supportive and fit well. Wear closed toe shoes.  When using a stepladder, make sure it is fully opened and both spreaders are firmly locked. Do not climb a closed stepladder.  Add color  or contrast paint or tape to grab bars and handrails in your home. Place contrasting color strips on first and last steps.  Learn and use mobility aids as needed. Install an electrical emergency response system.  Turn on lights to avoid dark areas. Replace light bulbs that burn out immediately. Get light switches that glow.  Arrange furniture to create clear pathways. Keep furniture in the same place.  Firmly attach carpet with non-skid or double-sided tape.  Eliminate uneven floor surfaces.  Select a carpet pattern that does not visually hide the edge of steps.  Be aware of all pets. OTHER HOME SAFETY TIPS  Set the  water temperature for 120 F (48.8 C).  Keep emergency numbers on or near the telephone.  Keep smoke detectors on every level of the home and near sleeping areas. Document Released: 01/03/2002 Document Revised: 07/15/2011 Document Reviewed: 04/04/2011 [facility] Patient Information 2015 [place], [place]. This information is not intended to replace advice given to you by your health care provider. Make sure you discuss any questions you have with your health care provider.   Preventive Care for Adults Ages 58 and over  Blood pressure check.** / Every 1 to 2 years.  Lipid and cholesterol check.**/ Every 5 years beginning at age 77.  Lung cancer screening. / Every year if you are aged 29-80 years and have a 30-pack-year history of smoking and currently smoke or have quit within the past 15 years. Yearly screening is stopped once you have quit smoking for at least 15 years or develop a health problem that would prevent you from having lung cancer treatment.  Fecal occult blood test (FOBT) of stool. / Every year beginning at age 10 and continuing until age 70. You may not have to do this test if you get a colonoscopy every 10 years.  Flexible sigmoidoscopy** or colonoscopy.** / Every 5 years for a flexible sigmoidoscopy or every 10 years for a colonoscopy beginning at age 76 and continuing until age 35.  Hepatitis C blood test.** / For all people born from [birth year] through 1963 and any individual with known risks for hepatitis C.  Abdominal aortic aneurysm (AAA) screening.** / A one-time screening for ages 12 to 33 years who are current or former smokers.  Skin self-exam. / Monthly.  Influenza vaccine. / Every year.  Tetanus, diphtheria, and acellular pertussis (Tdap/Td) vaccine.** / 1 dose of Td every 10 years.  Varicella vaccine.** / Consult your health care provider.  Zoster vaccine.** / 1 dose for adults aged 48 years or older.  Pneumococcal 13-valent conjugate (PCV13) vaccine.** /  Consult your health care provider.  Pneumococcal polysaccharide (PPSV23) vaccine.** / 1 dose for all adults aged 17 years and older.  Meningococcal vaccine.** / Consult your health care provider.  Hepatitis A vaccine.** / Consult your health care provider.  Hepatitis B vaccine.** / Consult your health care provider.  Haemophilus influenzae type b (Hib) vaccine.** / Consult your health care provider. **Family history and personal history of risk and conditions may change your health care provider's recommendations. Document Released: 03/11/2001 Document Revised: 01/18/2013 Document Reviewed: 06/10/2010 [facility] Patient Information 2015 [place], [place]. This information is not intended to replace advice given to you by your health care provider. Make sure you discuss any questions you have with your health care provider.

## 2015-06-19 ENCOUNTER — Encounter: Payer: Self-pay | Admitting: Internal Medicine

## 2015-06-19 MED FILL — GENERLAC 10 GM/15 ML SOLN: 10 | 16 days supply | Qty: 473 | Fill #1

## 2015-06-28 ENCOUNTER — Other Ambulatory Visit: Payer: Medicare Other

## 2015-06-28 DIAGNOSIS — Z8619 Personal history of other infectious and parasitic diseases: Secondary | ICD-10-CM

## 2015-06-29 LAB — H. PYLORI BREATH TEST: H. PYLORI BREATH TEST: NOT DETECTED

## 2015-10-08 ENCOUNTER — Other Ambulatory Visit: Payer: Self-pay | Admitting: Internal Medicine

## 2015-10-18 ENCOUNTER — Telehealth: Payer: Self-pay | Admitting: Cardiology

## 2015-10-18 ENCOUNTER — Ambulatory Visit (INDEPENDENT_AMBULATORY_CARE_PROVIDER_SITE_OTHER): Payer: Medicare Other | Admitting: Behavioral Health

## 2015-10-18 DIAGNOSIS — Z23 Encounter for immunization: Secondary | ICD-10-CM | POA: Diagnosis not present

## 2015-10-18 NOTE — Progress Notes (Addendum)
Pre visit review using our clinic review tool, if applicable. No additional management support is needed unless otherwise documented below in the visit note.  Patient in clinic today for Influenza vaccination. IM given in Left Deltoid. Patient tolerated injection well.

## 2015-10-18 NOTE — Telephone Encounter (Signed)
Pt called because he has been having a dull ache on chest for the last 3 to 4 days. Pt said that today he has been doing yard work and lifting things then the dull ache on chest and left arm started again. Pt denies any other symptoms. Pt doesn't know if it is because of the yard work and the lifting that he did that  is producing the ache, but he wants to be seen soon as precaution. There is not an opening tomorrow Friday 9/22 Nd.  Pt is going out of town on Monday to Thursday next week, but he can come on Friday  10/26/15. An appointment was made for pt with Truitt Merle NP on Friday 9/29 at 3:00 Pm pt is aware of appointment and that if the symptoms get worse to go to the ED. Pt verbalized understanding.

## 2015-10-18 NOTE — Telephone Encounter (Signed)
New message      Pt c/o of Chest Pain: STAT if CP now or developed within 24 hours  1. Are you having CP right now?  yes  2. Are you experiencing any other symptoms (ex. SOB, nausea, vomiting, sweating)?  no  3. How long have you been experiencing CP?  Dull ache around heart up to left shoulder for about a week.   4. Is your CP continuous or coming and going?  Comes and goes 5. Have you taken Nitroglycerin?  ?no Pt played golf today and he thinks he may need a stress test

## 2015-10-23 ENCOUNTER — Encounter: Payer: Self-pay | Admitting: Nurse Practitioner

## 2015-10-26 ENCOUNTER — Encounter: Payer: Self-pay | Admitting: Nurse Practitioner

## 2015-10-26 ENCOUNTER — Ambulatory Visit (INDEPENDENT_AMBULATORY_CARE_PROVIDER_SITE_OTHER): Payer: Medicare Other | Admitting: Nurse Practitioner

## 2015-10-26 VITALS — BP 140/80 | HR 87 | Ht 64.0 in | Wt 151.4 lb

## 2015-10-26 DIAGNOSIS — R079 Chest pain, unspecified: Secondary | ICD-10-CM | POA: Diagnosis not present

## 2015-10-26 NOTE — Progress Notes (Signed)
CARDIOLOGY OFFICE NOTE  Date:  10/26/2015    Michael Johns Date of Birth: 1932/04/09 Medical Record [id number]  PCP:  Kathlene November, MD  Cardiologist:  [facility]  Chief Complaint  Patient presents with  . Chest Pain    Work in visit - seen for Dr. Curt Bears    History of Present Illness: Michael Johns is a 82 y.o. male who presents today for a work in visit. Seen for Dr. Curt Bears.   He has a history of GERD, prostate cancer, hypertension and hyperlipidemia.  He was seen here back in February for atypical chest pain - this was felt to be GI. No further cardiac work up was felt to be needed at that time.   Phone call last week - "Pt called because he has been having a dull ache on chest for the last 3 to 4 days. Pt said that today he has been doing yard work and lifting things then the dull ache on chest and left arm started again. Pt denies any other symptoms. Pt doesn't know if it is because of the yard work and the lifting that he did that is producing the ache, but he wants to be seen soon as precaution. There is not an opening tomorrow Friday 9/22 Nd.  Pt is going out of town on Monday to Thursday next week, but he can come on Friday  10/26/15. An appointment was made for pt with Truitt Merle NP on Friday 9/29 at 3:00 Pm pt is aware of appointment and that if the symptoms get worse to go to the ED. Pt verbalized understanding."  Thus added to my schedule for today.   Comes in today. Here alone. He thought he was here for a stress test. Was moving 1/2 ton of rock last week - wondered if he pulled a muscle. Now feels fine. He golfs regularly. Remains pretty active for his age. Notes that after he moved the rocks he had this aching in his chest  - seemed to come and go - would last for a few hours and then go away - no other associated symptoms. Did not take anything. Felt fine this week - golfed down in [place] for the past 4 days and did great. No more chest pain. Remains very active -  typically gets in over 10,000 steps a day. BP is good.   Past Medical History:  Diagnosis Date  . Abdominal pain 11/09   due to constipation and ?pancreatitis- admitted  . ADENOCARCINOMA, PROSTATE 07/01/2006  . Atypical chest pain 04/01/2015  . Constipation    chronic, impaction ~2012 (admited) and 01-2015  . GERD (gastroesophageal reflux disease)   . Gout    see 09-2008 note  . Hearing decreased    right, 40% x years  . [place], MILD 07/18/2009  . HYPERTENSION 05/19/2006    Past Surgical History:  Procedure Laterality Date  . APPENDECTOMY    . cataracts Bilateral 2014  . INGUINAL HERNIA REPAIR Right 11/2007  . POLYPECTOMY    . PROSTATECTOMY  05/2007   LADS (-) Bone scan (-)  . Rt middle ear surgery    . sinus polyp removed    . TONSILLECTOMY       Medications: Current Outpatient Prescriptions  Medication Sig Dispense Refill  . amLODipine (NORVASC) 5 MG tablet Take 1 tablet (5 mg total) by mouth daily. 90 tablet 2  . aspirin 81 MG tablet Take 81 mg by mouth daily.     Marland Kitchen  atorvastatin (LIPITOR) 10 MG tablet Take 1 tablet (10 mg total) by mouth daily. 90 tablet 1  . Co-Enzyme Q10 200 MG CAPS Take 1 capsule by mouth daily.    . hydrochlorothiazide (HYDRODIURIL) 12.5 MG tablet Take 1 tablet (12.5 mg total) by mouth daily. 90 tablet 2  . lactulose (CHRONULAC) 10 GM/15ML solution Take 30 mLs (20 g total) by mouth daily as needed for mild constipation. 473 mL 1  . metoprolol succinate (TOPROL-XL) 25 MG 24 hr tablet Take 1 tablet (25 mg total) by mouth daily. 90 tablet 2  . Multiple Vitamin (MULTIVITAMIN) tablet Take 1 tablet by mouth daily.      Marland Kitchen omeprazole (PRILOSEC) 20 MG capsule Take 1 capsule (20 mg total) by mouth daily. 90 capsule 2  . polyethylene glycol (MIRALAX / GLYCOLAX) packet Take 17 g by mouth as needed. Reported on 06/15/2015    . Probiotic Product (PROBIOTIC-10 PO) Take 1 capsule by mouth daily.    Marland Kitchen senna (SENOKOT) 8.6 MG tablet Take 1 tablet by mouth as needed.  Reported on 06/15/2015     No current facility-administered medications for this visit.     Allergies: Allergies  Allergen Reactions  . Cephalexin Hives    Social History: The patient  reports that he has never smoked. He has never used smokeless tobacco. He reports that he drinks alcohol. He reports that he does not use drugs.   Family History: The patient's family history includes Cancer in his father and other; Diabetes in his mother; Esophageal cancer in his father; Heart disease in his maternal grandmother and mother; Hypertension in his mother; Leukemia in his maternal grandfather.   Review of Systems: Please see the history of present illness.   Otherwise, the review of systems is positive for none.   All other systems are reviewed and negative.   Physical Exam: VS:  BP 140/80   Pulse 87   Ht 5\' 4"  (1.626 m)   Wt 151 lb 6.4 oz (68.7 kg)   SpO2 100% Comment: at rest  BMI 25.99 kg/m  .  BMI Body mass index is 25.99 kg/m.  Wt Readings from Last 3 Encounters:  10/26/15 151 lb 6.4 oz (68.7 kg)  06/18/15 149 lb 6 oz (67.8 kg)  04/11/15 150 lb 6 oz (68.2 kg)   BP is 120/80 by me.   General: Pleasant. Well developed, well nourished and in no acute distress.  He looks younger than his stated age.  HEENT: Normal.  Neck: Supple, no JVD, carotid bruits, or masses noted.  Cardiac: Regular rate and rhythm. Heart tones are distant. No edema.  Respiratory:  Lungs are clear to auscultation bilaterally with normal work of breathing.  GI: Soft and nontender.  MS: No deformity or atrophy. Gait and ROM intact.  Skin: Warm and dry. Color is normal.  Neuro:  Strength and sensation are intact and no gross focal deficits noted.  Psych: Alert, appropriate and with normal affect.   LABORATORY DATA:  EKG:  EKG is ordered today. This demonstrates NSR and is unchanged.  Lab Results  Component Value Date   WBC 8.2 06/18/2015   HGB 13.8 06/18/2015   HCT 40.9 06/18/2015   PLT 276.0  06/18/2015   GLUCOSE 96 06/18/2015   CHOL 152 03/23/2015   TRIG 182.0 (H) 03/23/2015   HDL 49.60 03/23/2015   LDLDIRECT 150.2 04/16/2011   LDLCALC 66 03/23/2015   ALT 15 03/23/2015   AST 22 03/23/2015   NA 132 (L) 06/18/2015  K 4.2 06/18/2015   CL 97 06/18/2015   CREATININE 1.02 06/18/2015   BUN 11 06/18/2015   CO2 29 06/18/2015   TSH 3.77 06/18/2015   PSA 0.00 (L) 06/18/2015    BNP (last 3 results) No results for input(s): BNP in the last 8760 hours.  ProBNP (last 3 results) No results for input(s): PROBNP in the last 8760 hours.   Other Studies Reviewed Today:   Assessment/Plan: 1. Chest pain - sounds like it was more musculoskeletal - but has CV risk factors - will arrange for GXT. Further disposition to follow.   2. HTN - repeat BP by me is fine.   3. HLD - on statin therapy - monitored by his PCP.  Current medicines are reviewed with the patient today.  The patient does not have concerns regarding medicines other than what has been noted above.  The following changes have been made:  See above.  Labs/ tests ordered today include:    Orders Placed This Encounter  Procedures  . EXERCISE TOLERANCE TEST  . EKG 12-Lead     Disposition:   Further disposition to follow.   Patient is agreeable to this plan and will call if any problems develop in the interim.   Signed: Burtis Junes, RN, ANP-C 10/26/2015 3:09 PM  [facility] [address] [place], [place]  [address] Phone: [phone] Fax: [phone]

## 2015-10-26 NOTE — Patient Instructions (Addendum)
We will be checking the following labs today - NONE   Medication Instructions:    Continue with your current medicines.     Testing/Procedures To Be Arranged:  GXT  Follow-Up:   We will see how your stress test turns out and then decide about follow up    Other Special Instructions:   N/A    If you need a refill on your cardiac medications before your next appointment, please call your pharmacy.   Call the [place] office at [phone] if you have any questions, problems or concerns.

## 2015-10-31 ENCOUNTER — Other Ambulatory Visit: Payer: Self-pay | Admitting: Internal Medicine

## 2015-10-31 MED FILL — GENERLAC 10 GM/15 ML SOLN: 10 | 16 days supply | Qty: 473 | Fill #0

## 2015-11-06 ENCOUNTER — Ambulatory Visit (INDEPENDENT_AMBULATORY_CARE_PROVIDER_SITE_OTHER): Payer: Medicare Other

## 2015-11-06 DIAGNOSIS — R079 Chest pain, unspecified: Secondary | ICD-10-CM | POA: Diagnosis not present

## 2015-11-06 LAB — EXERCISE TOLERANCE TEST
Estimated workload: 11.5 METS
Exercise duration (min): 10 min
Exercise duration (sec): 1 s
MPHR: 139 {beats}/min
Peak HR: 142 {beats}/min
Percent HR: 102 %
Percent of predicted max HR: 102 %
RPE: 15
Rest HR: 54 {beats}/min
Stage 1 DBP: 72 mmHg
Stage 1 Grade: 0 %
Stage 1 HR: 58 {beats}/min
Stage 1 SBP: 129 mmHg
Stage 1 Speed: 0 mph
Stage 10 DBP: 67 mmHg
Stage 10 Grade: 0 %
Stage 10 HR: 63 {beats}/min
Stage 10 SBP: 138 mmHg
Stage 10 Speed: 0 mph
Stage 2 Grade: 0 %
Stage 2 HR: 59 {beats}/min
Stage 2 Speed: 0 mph
Stage 3 Grade: 0 %
Stage 3 HR: 62 {beats}/min
Stage 3 Speed: 1 mph
Stage 4 Grade: 0.1 %
Stage 4 HR: 62 {beats}/min
Stage 4 Speed: 1 mph
Stage 5 DBP: 68 mmHg
Stage 5 Grade: 10 %
Stage 5 HR: 90 {beats}/min
Stage 5 SBP: 139 mmHg
Stage 5 Speed: 1.7 mph
Stage 6 DBP: 65 mmHg
Stage 6 Grade: 12 %
Stage 6 HR: 93 {beats}/min
Stage 6 SBP: 152 mmHg
Stage 6 Speed: 2.5 mph
Stage 7 DBP: 70 mmHg
Stage 7 Grade: 14 %
Stage 7 HR: 121 {beats}/min
Stage 7 SBP: 194 mmHg
Stage 7 Speed: 3.3 mph
Stage 8 Grade: 16 %
Stage 8 HR: 142 {beats}/min
Stage 8 Speed: 4.1 mph
Stage 9 DBP: 67 mmHg
Stage 9 Grade: 0 %
Stage 9 HR: 120 {beats}/min
Stage 9 SBP: 193 mmHg
Stage 9 Speed: 1.5 mph

## 2015-12-08 ENCOUNTER — Telehealth: Payer: Self-pay | Admitting: Internal Medicine

## 2015-12-08 NOTE — Telephone Encounter (Signed)
complete

## 2015-12-12 ENCOUNTER — Encounter: Payer: Self-pay | Admitting: Internal Medicine

## 2015-12-14 ENCOUNTER — Encounter: Payer: Self-pay | Admitting: Internal Medicine

## 2015-12-18 ENCOUNTER — Encounter: Payer: Self-pay | Admitting: Internal Medicine

## 2015-12-19 ENCOUNTER — Ambulatory Visit (INDEPENDENT_AMBULATORY_CARE_PROVIDER_SITE_OTHER): Payer: Medicare Other | Admitting: Internal Medicine

## 2015-12-19 ENCOUNTER — Encounter: Payer: Self-pay | Admitting: Internal Medicine

## 2015-12-19 ENCOUNTER — Ambulatory Visit: Payer: Medicare Other | Admitting: Internal Medicine

## 2015-12-19 VITALS — BP 126/76 | HR 76 | Temp 97.8°F | Resp 12 | Ht 64.0 in | Wt 153.2 lb

## 2015-12-19 DIAGNOSIS — K5909 Other constipation: Secondary | ICD-10-CM

## 2015-12-19 DIAGNOSIS — I10 Essential (primary) hypertension: Secondary | ICD-10-CM

## 2015-12-19 MED ORDER — METOPROLOL SUCCINATE ER 50 MG PO TB24: 50.0000 mg | ORAL_TABLET | Freq: Every day | ORAL | 1 refills | Status: DC

## 2015-12-19 NOTE — Patient Instructions (Addendum)
   GO TO THE FRONT DESK Schedule your next appointment for a  checkup in 3 months   stop amlodipine now   Increase Toprol-XL to 50 mg daily.  Check the  blood pressure and pulse 2 or 3 times a  week   Be sure your blood pressure is between 110/65 and  145/85. Pulse should not be less than 55.  If it is consistently higher or lower, let me know

## 2015-12-19 NOTE — Progress Notes (Signed)
Subjective:    Patient ID: Michael Johns, male    DOB: 1932-11-12, 82 y.o.   MRN: [id number]  DOS:  12/19/2015 Type of visit - description : rov Interval history:  HTN: Good med compliance, ambulatory BPs are usually within normal. See patient's message, Constipation: Chronic issue, patient is somewhat concerned about the fact that he needs to take MiraLAX daily and lactulose every third day. Chest pain: Saw cardiology, had a stress test, negative.  Review of Systems No further chest pain No nausea or vomiting No abdominal pain  Past Medical History:  Diagnosis Date  . Abdominal pain 11/09   due to constipation and ?pancreatitis- admitted  . ADENOCARCINOMA, PROSTATE 07/01/2006  . Atypical chest pain 04/01/2015  . Constipation    chronic, impaction ~2012 (admited) and 01-2015  . GERD (gastroesophageal reflux disease)   . Gout    see 09-2008 note  . Hearing decreased    right, 40% x years  . [place], MILD 07/18/2009  . HYPERTENSION 05/19/2006    Past Surgical History:  Procedure Laterality Date  . APPENDECTOMY    . cataracts Bilateral 2014  . INGUINAL HERNIA REPAIR Right 11/2007  . POLYPECTOMY    . PROSTATECTOMY  05/2007   LADS (-) Bone scan (-)  . Rt middle ear surgery    . sinus polyp removed    . TONSILLECTOMY      Social History   Social History  . Marital status: Married    Spouse name: N/A  . Number of children: 0  . Years of education: N/A   Occupational History  . retired  Retired   Social History Main Topics  . Smoking status: Never Smoker  . Smokeless tobacco: Never Used  . Alcohol use Yes     Comment: socially   . Drug use: No  . Sexual activity: Not on file   Other Topics Concern  . Not on file   Social History Narrative   Re-married, no children, Joycelyn Schmid , present wife has 3 daughters      retired-- worked for the [employer]       Accurate as of 12/19/15 11:59 PM. Always use your most recent med list.          aspirin 81 MG tablet Take 81 mg by mouth daily.   atorvastatin 10 MG tablet Commonly known as:  LIPITOR Take 1 tablet (10 mg total) by mouth daily.   Co-Enzyme Q10 200 MG Caps Take 1 capsule by mouth daily.   hydrochlorothiazide 12.5 MG tablet Commonly known as:  HYDRODIURIL Take 1 tablet (12.5 mg total) by mouth daily.   lactulose (encephalopathy) 10 GM/15ML Soln Commonly known as:  GENERLAC Take 30 mLs (20 g total) by mouth daily as needed (mild constipation).   metoprolol succinate 50 MG 24 hr tablet Commonly known as:  TOPROL XL Take 1 tablet (50 mg total) by mouth daily. Take with or immediately following a meal.   multivitamin tablet Take 1 tablet by mouth daily.   omeprazole 20 MG capsule Commonly known as:  PRILOSEC Take 1 capsule (20 mg total) by mouth daily.   polyethylene glycol packet Commonly known as:  MIRALAX / GLYCOLAX Take 17 g by mouth as needed. Reported on 06/15/2015   PROBIOTIC-10 PO Take 1 capsule by mouth daily.   senna 8.6 MG tablet Commonly known as:  SENOKOT Take 1 tablet by mouth as needed. Reported on 06/15/2015  Objective:   Physical Exam BP 126/76 (BP Location: Left Arm, Patient Position: Sitting, Cuff Size: Small)   Pulse 76   Temp 97.8 F (36.6 C) (Oral)   Resp 12   Ht 5\' 4"  (1.626 m)   Wt 153 lb 4 oz (69.5 kg)   SpO2 97%   BMI 26.31 kg/m  General:   Well developed, well nourished . NAD.  HEENT:  Normocephalic . Face symmetric, atraumatic Lungs:  CTA B Normal respiratory effort, no intercostal retractions, no accessory muscle use. Heart: RRR,  no murmur.  No pretibial edema bilaterally  Skin: Not pale. Not jaundice Neurologic:  alert & oriented X3.  Speech normal, gait appropriate for age and unassisted Psych--  Cognition and judgment appear intact.  Cooperative with normal attention span and concentration.  Behavior appropriate. No anxious or depressed appearing.      Assessment & Plan:    Assessment> HTN Hyperlipidemia Leg cramps-- lipitor d/c in the past, cramps decreased, back on lipitor as cramps are  mild and benefits > risks. CoQ10 helps, CKs normal. rx robaxin 03-2015 Prostate cancer-- prostatectomy 05-2007, urology stopped f/u PSA checked by PCP [facility]: GERD,  --Dysphagia-- Rx  EGD 02-2014 (declined,sx chronic, mild) --H. pylori positive, re-checked breathing test negative 06-2015 --Chronic mild constipation, fecal impaction 2012 (admited per pt) and 01-2015 (ER): on MiraLAX qd, senokot or lactulose prn HOH   CP- saw cards, (-) stress test (EET) 10-2015   PLAN: Chronic constipation: Currently on MiraLAX daily and lactulose every third day. Pt somewhat concerned about ongoing need for meds. Chart reviewed, had a colonoscopy in 2000 and 2013. Last colonoscopy normal. Never formally evaluated by GI for chronic constipation. The only medication that may be affecting him regards constipation is amlodipine. Plan: Stop amlodipine, continue MiraLAX and lactulose. Reassess in 3 months. HTN: Well-controlled on metoprolol, HCTZ and amlodipine but were stopping amlodipine. Increase metoprolol to 50 mg daily. Continue Maxzide. H. pylori: Breathing test came back negative. RTC 3 months

## 2015-12-19 NOTE — Progress Notes (Signed)
Pre visit review using our clinic review tool, if applicable. No additional management support is needed unless otherwise documented below in the visit note. 

## 2015-12-21 NOTE — Assessment & Plan Note (Signed)
Chronic constipation: Currently on MiraLAX daily and lactulose every third day. Pt somewhat concerned about ongoing need for meds. Chart reviewed, had a colonoscopy in 2000 and 2013. Last colonoscopy normal. Never formally evaluated by GI for chronic constipation. The only medication that may be affecting him regards constipation is amlodipine. Plan: Stop amlodipine, continue MiraLAX and lactulose. Reassess in 3 months. HTN: Well-controlled on metoprolol, HCTZ and amlodipine but were stopping amlodipine. Increase metoprolol to 50 mg daily. Continue Maxzide. H. pylori: Breathing test came back negative. RTC 3 months

## 2016-01-08 ENCOUNTER — Other Ambulatory Visit: Payer: Self-pay | Admitting: Internal Medicine

## 2016-01-08 ENCOUNTER — Encounter: Payer: Self-pay | Admitting: Internal Medicine

## 2016-01-15 MED FILL — GENERLAC 10 GM/15 ML SOLN: 10 | 16 days supply | Qty: 473 | Fill #1

## 2016-01-29 ENCOUNTER — Encounter: Payer: Self-pay | Admitting: Internal Medicine

## 2016-02-19 ENCOUNTER — Encounter: Payer: Self-pay | Admitting: Internal Medicine

## 2016-03-12 MED FILL — METOPROLOL SUCC ER 50 MG TA: 50 | 90 days supply | Qty: 90 | Fill #0

## 2016-03-19 ENCOUNTER — Encounter: Payer: Self-pay | Admitting: Internal Medicine

## 2016-03-24 ENCOUNTER — Other Ambulatory Visit: Payer: Self-pay

## 2016-03-24 ENCOUNTER — Ambulatory Visit (INDEPENDENT_AMBULATORY_CARE_PROVIDER_SITE_OTHER): Payer: Medicare Other | Admitting: Internal Medicine

## 2016-03-24 ENCOUNTER — Encounter: Payer: Self-pay | Admitting: Internal Medicine

## 2016-03-24 VITALS — BP 132/78 | HR 58 | Temp 97.9°F | Resp 12 | Ht 64.0 in | Wt 152.4 lb

## 2016-03-24 DIAGNOSIS — E785 Hyperlipidemia, unspecified: Secondary | ICD-10-CM | POA: Diagnosis not present

## 2016-03-24 DIAGNOSIS — I10 Essential (primary) hypertension: Secondary | ICD-10-CM

## 2016-03-24 DIAGNOSIS — K5909 Other constipation: Secondary | ICD-10-CM

## 2016-03-24 MED ORDER — METOPROLOL SUCCINATE ER 50 MG PO TB24: 50.0000 mg | ORAL_TABLET | Freq: Every day | ORAL | 2 refills | Status: DC

## 2016-03-24 NOTE — Progress Notes (Signed)
Subjective:    Patient ID: Michael Johns, male    DOB: 05/20/1932, 83 y.o.   MRN: [id number]  DOS:  03/24/2016 Type of visit - description : rov Interval history: HTN:see previous visit. He is back on amlodipine, ambulatory BPs are great Constipation: Stopping amlodipine did not help, BP increased, he is back on amlodipine and doing well.    Review of Systems Denies nausea, vomiting. No abdominal pain  Past Medical History:  Diagnosis Date  . Abdominal pain 11/09   due to constipation and ?pancreatitis- admitted  . ADENOCARCINOMA, PROSTATE 07/01/2006  . Atypical chest pain 04/01/2015  . Constipation    chronic, impaction ~2012 (admited) and 01-2015  . GERD (gastroesophageal reflux disease)   . Gout    see 09-2008 note  . Hearing decreased    right, 40% x years  . [place], MILD 07/18/2009  . HYPERTENSION 05/19/2006    Past Surgical History:  Procedure Laterality Date  . APPENDECTOMY    . cataracts Bilateral 2014  . INGUINAL HERNIA REPAIR Right 11/2007  . POLYPECTOMY    . PROSTATECTOMY  05/2007   LADS (-) Bone scan (-)  . Rt middle ear surgery    . sinus polyp removed    . TONSILLECTOMY      Social History   Social History  . Marital status: Married    Spouse name: N/A  . Number of children: 0  . Years of education: N/A   Occupational History  . retired  Retired   Social History Main Topics  . Smoking status: Never Smoker  . Smokeless tobacco: Never Used  . Alcohol use Yes     Comment: socially   . Drug use: No  . Sexual activity: Not on file   Other Topics Concern  . Not on file   Social History Narrative   Re-married, no children, Joycelyn Schmid , present wife has 3 daughters      retired-- worked for the [employer] as of 03/24/2016      Reactions   Cephalexin Hives      Medication List       Accurate as of 03/24/16 11:05 AM. Always use your most recent med list.          amLODipine 5 MG tablet Commonly known as:  NORVASC Take  5 mg by mouth daily.   aspirin 81 MG tablet Take 81 mg by mouth daily.   atorvastatin 10 MG tablet Commonly known as:  LIPITOR Take 1 tablet (10 mg total) by mouth daily.   Co-Enzyme Q10 200 MG Caps Take 1 capsule by mouth daily.   hydrochlorothiazide 12.5 MG tablet Commonly known as:  HYDRODIURIL Take 1 tablet (12.5 mg total) by mouth daily.   lactulose (encephalopathy) 10 GM/15ML Soln Commonly known as:  GENERLAC Take 30 mLs (20 g total) by mouth daily as needed (mild constipation).   metoprolol succinate 50 MG 24 hr tablet Commonly known as:  TOPROL XL Take 1 tablet (50 mg total) by mouth daily. Take with or immediately following a meal.   multivitamin tablet Take 1 tablet by mouth daily.   omeprazole 20 MG capsule Commonly known as:  PRILOSEC Take 1 capsule (20 mg total) by mouth daily.   polyethylene glycol packet Commonly known as:  MIRALAX / GLYCOLAX Take 17 g by mouth as needed. Reported on 06/15/2015   PROBIOTIC-10 PO Take 1 capsule by mouth daily.   senna 8.6 MG tablet Commonly known as:  SENOKOT Take 1 tablet by mouth as needed. Reported on 06/15/2015          Objective:   Physical Exam BP 132/78 (BP Location: Left Arm, Patient Position: Sitting, Cuff Size: Normal)   Pulse (!) 58   Temp 97.9 F (36.6 C) (Oral)   Resp 12   Ht 5\' 4"  (1.626 m)   Wt 152 lb 6 oz (69.1 kg)   SpO2 97%   BMI 26.16 kg/m  General:   Well developed, well nourished . NAD.  HEENT:  Normocephalic . Face symmetric, atraumatic Lungs:  CTA B Normal respiratory effort, no intercostal retractions, no accessory muscle use. Heart: RRR,  no murmur.  No pretibial edema bilaterally  Skin: Not pale. Not jaundice Neurologic:  alert & oriented X3.  Speech normal, gait appropriate for age and unassisted Psych--  Cognition and judgment appear intact.  Cooperative with normal attention span and concentration.  Behavior appropriate. No anxious or depressed appearing.        Assessment & Plan:   Assessment> HTN Hyperlipidemia Leg cramps-- lipitor d/c in the past, cramps decreased, back on lipitor as cramps are  mild and benefits > risks. CoQ10 helps, CKs normal. rx robaxin 03-2015 Prostate cancer-- prostatectomy 05-2007, urology stopped f/u PSA checked by PCP [facility]: GERD,  --Dysphagia-- Rx  EGD 02-2014 (declined,sx chronic, mild) --H. pylori positive, re-checked breathing test negative 06-2015 --Chronic mild constipation, fecal impaction 2012 (admited per pt) and 01-2015 (ER): on MiraLAX qd, senokot or lactulose prn HOH   CP- saw cards, (-) stress test (EET) 10-2015   PLAN: Chronic constipation: He stopped amlodipine, constipation did not improve, BPs increased, he is back on amlodipine and doing well. Current constipation management is: Lactulose every third days, MiraLAX / senna as needed. Other option discussed (Linzess?) but patient is feeling well at this point. HTN: Since the last visit, he is back on amlodipine, metoprolol dose increased from 25-50 mg q d and continue HCTZ.. Ambulatory BPs in the 120s/60s. Pulse in the 60s. No change.  Check a BMP. Hyperlipidemia: On Lipitor, check a FLP, AST, ALT  RTC 4-5 months CPX, offered Medicare wellness.

## 2016-03-24 NOTE — Patient Instructions (Signed)
GO TO THE FRONT DESK Schedule your next appointment for a yearly exam in 4-5 months   Consider a medicare wellness with one of our nurses   Schedule labs to be done this week, fasting

## 2016-03-24 NOTE — Progress Notes (Signed)
Pre visit review using our clinic review tool, if applicable. No additional management support is needed unless otherwise documented below in the visit note. 

## 2016-03-24 NOTE — Assessment & Plan Note (Signed)
Chronic constipation: He stopped amlodipine, constipation did not improve, BPs increased, he is back on amlodipine and doing well. Current constipation management is: Lactulose every third days, MiraLAX / senna as needed. Other option discussed (Linzess?) but patient is feeling well at this point. HTN: Since the last visit, he is back on amlodipine, metoprolol dose increased from 25-50 mg q d and continue HCTZ.. Ambulatory BPs in the 120s/60s. Pulse in the 60s. No change.  Check a BMP. Hyperlipidemia: On Lipitor, check a FLP, AST, ALT  RTC 4-5 months CPX, offered Medicare wellness.

## 2016-03-26 ENCOUNTER — Other Ambulatory Visit (INDEPENDENT_AMBULATORY_CARE_PROVIDER_SITE_OTHER): Payer: Medicare Other

## 2016-03-26 DIAGNOSIS — I10 Essential (primary) hypertension: Secondary | ICD-10-CM

## 2016-03-26 DIAGNOSIS — E785 Hyperlipidemia, unspecified: Secondary | ICD-10-CM | POA: Diagnosis not present

## 2016-03-26 LAB — BASIC METABOLIC PANEL
BUN: 15 mg/dL (ref 6–23)
CO2: 30 mEq/L (ref 19–32)
Calcium: 9.4 mg/dL (ref 8.4–10.5)
Chloride: 100 mEq/L (ref 96–112)
Creatinine, Ser: 1 mg/dL (ref 0.40–1.50)
GFR: 76.05 mL/min (ref >60.00)
Glucose, Bld: 91 mg/dL (ref 70–99)
POTASSIUM: 3.9 meq/L (ref 3.5–5.1)
Sodium: 135 mEq/L (ref 135–145)

## 2016-03-26 LAB — LIPID PANEL
CHOL/HDL RATIO: 3
Cholesterol: 165 mg/dL (ref 0–200)
HDL: 47.9 mg/dL (ref >39.00)
LDL Cholesterol: 87 mg/dL (ref 0–99)
NONHDL: 117.08
TRIGLYCERIDES: 151 mg/dL — AB (ref 0.0–149.0)
VLDL: 30.2 mg/dL (ref 0.0–40.0)

## 2016-03-26 LAB — AST: AST: 21 U/L (ref 0–37)

## 2016-03-26 LAB — ALT: ALT: 16 U/L (ref 0–53)

## 2016-03-28 ENCOUNTER — Encounter: Payer: Self-pay | Admitting: Internal Medicine

## 2016-04-08 ENCOUNTER — Other Ambulatory Visit: Payer: Self-pay | Admitting: Internal Medicine

## 2016-04-08 MED FILL — GENERLAC 10 GM/15 ML SOLN: 10 | 16 days supply | Qty: 473 | Fill #0

## 2016-05-06 ENCOUNTER — Encounter: Payer: Self-pay | Admitting: Internal Medicine

## 2016-06-09 MED FILL — GENERLAC 10 GM/15 ML SOLN: 10 | 16 days supply | Qty: 473 | Fill #1

## 2016-07-20 ENCOUNTER — Other Ambulatory Visit: Payer: Self-pay | Admitting: Internal Medicine

## 2016-07-28 ENCOUNTER — Encounter: Payer: Self-pay | Admitting: Internal Medicine

## 2016-07-28 ENCOUNTER — Telehealth: Payer: Self-pay | Admitting: *Deleted

## 2016-07-28 ENCOUNTER — Other Ambulatory Visit: Payer: Self-pay | Admitting: Internal Medicine

## 2016-07-28 MED FILL — LACTULOSE 10 GM/15 ML SOLN: 10 | 16 days supply | Qty: 473 | Fill #0

## 2016-07-28 NOTE — Telephone Encounter (Signed)
Pt declines AWV.

## 2016-07-31 ENCOUNTER — Encounter: Payer: Self-pay | Admitting: Internal Medicine

## 2016-07-31 ENCOUNTER — Ambulatory Visit (INDEPENDENT_AMBULATORY_CARE_PROVIDER_SITE_OTHER): Payer: Medicare Other | Admitting: Internal Medicine

## 2016-07-31 VITALS — BP 128/78 | HR 53 | Temp 97.5°F | Resp 14 | Ht 64.0 in | Wt 153.1 lb

## 2016-07-31 DIAGNOSIS — R1310 Dysphagia, unspecified: Secondary | ICD-10-CM | POA: Diagnosis not present

## 2016-07-31 DIAGNOSIS — I10 Essential (primary) hypertension: Secondary | ICD-10-CM

## 2016-07-31 DIAGNOSIS — Z0000 Encounter for general adult medical examination without abnormal findings: Secondary | ICD-10-CM

## 2016-07-31 DIAGNOSIS — E785 Hyperlipidemia, unspecified: Secondary | ICD-10-CM

## 2016-07-31 DIAGNOSIS — Z8546 Personal history of malignant neoplasm of prostate: Secondary | ICD-10-CM

## 2016-07-31 LAB — CBC WITH DIFFERENTIAL/PLATELET
BASOS ABS: 0.1 10*3/uL (ref 0.0–0.1)
Basophils Relative: 0.8 % (ref 0.0–3.0)
EOS ABS: 0.3 10*3/uL (ref 0.0–0.7)
Eosinophils Relative: 4.1 % (ref 0.0–5.0)
HCT: 40 % (ref 39.0–52.0)
Hemoglobin: 14.4 g/dL (ref 13.0–17.0)
LYMPHS ABS: 2.4 10*3/uL (ref 0.7–4.0)
Lymphocytes Relative: 31.2 % (ref 12.0–46.0)
MCHC: 36 g/dL (ref 30.0–36.0)
MCV: 89 fl (ref 78.0–100.0)
MONO ABS: 1 10*3/uL (ref 0.1–1.0)
MONOS PCT: 13.6 % — AB (ref 3.0–12.0)
NEUTROS PCT: 50.3 % (ref 43.0–77.0)
Neutro Abs: 3.8 10*3/uL (ref 1.4–7.7)
Platelets: 231 10*3/uL (ref 150.0–400.0)
RBC: 4.49 Mil/uL (ref 4.22–5.81)
RDW: 14.1 % (ref 11.5–15.5)
WBC: 7.6 10*3/uL (ref 4.0–10.5)

## 2016-07-31 LAB — PSA: PSA: 0.01 ng/mL — AB (ref 0.10–4.00)

## 2016-07-31 LAB — BASIC METABOLIC PANEL
BUN: 16 mg/dL (ref 6–23)
CALCIUM: 9.7 mg/dL (ref 8.4–10.5)
CO2: 30 mEq/L (ref 19–32)
Chloride: 98 mEq/L (ref 96–112)
Creatinine, Ser: 1.2 mg/dL (ref 0.40–1.50)
GFR: 61.56 mL/min (ref >60.00)
GLUCOSE: 98 mg/dL (ref 70–99)
Potassium: 4.9 mEq/L (ref 3.5–5.1)
SODIUM: 135 meq/L (ref 135–145)

## 2016-07-31 MED ORDER — LACTULOSE ENCEPHALOPATHY 10 GM/15ML PO SOLN: 20.0000 g | Freq: Every day | ORAL | 1 refills | Status: DC | PRN

## 2016-07-31 NOTE — Progress Notes (Signed)
Subjective:    Patient ID: Michael Johns, male    DOB: February 28, 1932, 83 y.o.   MRN: [id number]  DOS:  07/31/2016 Type of visit - description : CPX Interval history: No major concerns   Review of Systems  Good compliance of medication, essentially no symptoms, would like to decrease or stop PPIs due to some concerns regards long-term PPI effects  Other than above, a 14 point review of systems is negative     Past Medical History:  Diagnosis Date  . Abdominal pain 11/09   due to constipation and ?pancreatitis- admitted  . ADENOCARCINOMA, PROSTATE 07/01/2006  . Atypical chest pain 04/01/2015  . Constipation    chronic, impaction ~2012 (admited) and 01-2015  . GERD (gastroesophageal reflux disease)   . Gout    see 09-2008 note  . Hearing decreased    right, 40% x years  . [place], MILD 07/18/2009  . HYPERTENSION 05/19/2006    Past Surgical History:  Procedure Laterality Date  . APPENDECTOMY    . cataracts Bilateral 2014  . INGUINAL HERNIA REPAIR Right 11/2007  . POLYPECTOMY    . PROSTATECTOMY  05/2007   LADS (-) Bone scan (-)  . Rt middle ear surgery    . sinus polyp removed    . TONSILLECTOMY     Family History  Problem Relation Age of Onset  . Hypertension Mother   . Diabetes Mother   . Heart disease Mother        CABG 20 y/o  . Esophageal cancer Father   . Heart disease Maternal Grandmother        MI  . Leukemia Maternal Grandfather   . Cancer Other        all paternal uncles-types unknown  . Prostate cancer Neg Hx   . Colon cancer Neg Hx   . Rectal cancer Neg Hx   . Stomach cancer Neg Hx     Social History   Social History  . Marital status: Married    Spouse name: N/A  . Number of children: 0  . Years of education: N/A   Occupational History  . retired  Retired   Social History Main Topics  . Smoking status: Never Smoker  . Smokeless tobacco: Never Used  . Alcohol use Yes     Comment: socially   . Drug use: No  . Sexual activity: Not on  file   Other Topics Concern  . Not on file   Social History Narrative   Re-married, no children, Joycelyn Schmid , present wife has 3 daughters      retired-- worked for the [employer] as of 07/31/2016      Reactions   Cephalexin Hives      Medication List       Accurate as of 07/31/16  1:39 PM. Always use your most recent med list.          amLODipine 5 MG tablet Commonly known as:  NORVASC Take 5 mg by mouth daily.   aspirin 81 MG tablet Take 81 mg by mouth daily.   atorvastatin 10 MG tablet Commonly known as:  LIPITOR Take 1 tablet (10 mg total) by mouth daily.   Co-Enzyme Q10 200 MG Caps Take 1 capsule by mouth daily.   hydrochlorothiazide 12.5 MG tablet Commonly known as:  HYDRODIURIL Take 1 tablet (12.5 mg total) by mouth daily.   lactulose (encephalopathy) 10 GM/15ML Soln Commonly known as:  GENERLAC Take 30 mLs (20  g total) by mouth daily as needed (mild constipation).   metoprolol succinate 25 MG 24 hr tablet Commonly known as:  TOPROL-XL Take 1 tablet (25 mg total) by mouth daily.   multivitamin tablet Take 1 tablet by mouth daily.   omeprazole 20 MG capsule Commonly known as:  PRILOSEC Take 1 capsule (20 mg total) by mouth daily.   polyethylene glycol packet Commonly known as:  MIRALAX / GLYCOLAX Take 17 g by mouth as needed. Reported on 06/15/2015   PROBIOTIC-10 PO Take 1 capsule by mouth daily.   senna 8.6 MG tablet Commonly known as:  SENOKOT Take 1 tablet by mouth as needed. Reported on 06/15/2015          Objective:   Physical Exam BP 128/78 (BP Location: Left Arm, Patient Position: Sitting, Cuff Size: Small)   Pulse (!) 53   Temp (!) 97.5 F (36.4 C) (Oral)   Resp 14   Ht 5\' 4"  (1.626 m)   Wt 153 lb 2 oz (69.5 kg)   SpO2 97%   BMI 26.28 kg/m   General:   Well developed, well nourished . NAD.  Neck: No  thyromegaly  HEENT:  Normocephalic . Face symmetric, atraumatic Lungs:  CTA B Normal respiratory effort, no  intercostal retractions, no accessory muscle use. Heart: RRR,  no murmur.  No pretibial edema bilaterally  Abdomen:  Not distended, soft, non-tender. No rebound or rigidity.   Skin: Exposed areas without rash. Not pale. Not jaundice Neurologic:  alert & oriented X3.  Speech normal, gait appropriate for age and unassisted Strength symmetric and appropriate for age.  Psych: Cognition and judgment appear intact.  Cooperative with normal attention span and concentration.  Behavior appropriate. No anxious or depressed appearing.    Assessment & Plan:   Assessment HTN Hyperlipidemia Leg cramps-- lipitor d/c in the past, cramps decreased, back on lipitor as cramps are  mild and benefits > risks. CoQ10 helps, CKs normal. rx robaxin 03-2015 Prostate cancer-- prostatectomy 05-2007, urology stopped f/u,  PSA checked by PCP [facility]: GERD,  --Dysphagia-- Rx  EGD 02-2014 (declined,sx chronic, mild) --H. pylori positive, re-checked breathing test negative 06-2015 --Chronic mild constipation, fecal impaction 2012 (admited per pt) and 01-2015 (ER): on MiraLAX qd, senokot or lactulose prn HOH   CP- saw cards, (-) stress test (EET) 10-2015   PLAN: HTN: Well-controlled with HCTZ, metoprolol and amlodipine. Checking labs Hyperlipidemia: Continue Lipitor, last FLP satisfactory. Prostate cancer: asx, checking a PSA Dysphagia: On chronic PPIs, patient concerned about long-term effects, we agreed to decrease PPIs dose and then stop. RTC 6 months

## 2016-07-31 NOTE — Patient Instructions (Signed)
GO TO THE LAB : Get the blood work     GO TO THE FRONT DESK Schedule your next appointment for a  routine checkup in 6 months  Schedule a Medicare wellness one of the nurses at your convenience  Omeprazole: Take 1 tablet every other day for 3 months, then stop and take it as needed

## 2016-07-31 NOTE — Assessment & Plan Note (Addendum)
--  Td 2013; pneumonia shot 2009; prevnar 2015; shingles shot 2-09; shingrex discussed, on back order --Cscope 10-08, Adenomatous polyp, repeated cscope again 03-2011 Normal, was rec no f/u --Diet: ok but room for improvment  ; exercises regularly except for the last few weeks (wife's health); plays  golf   --Labs: BMP, CBC, PSA

## 2016-07-31 NOTE — Assessment & Plan Note (Signed)
HTN: Well-controlled with HCTZ, metoprolol and amlodipine. Checking labs Hyperlipidemia: Continue Lipitor, last FLP satisfactory. Prostate cancer: asx, checking a PSA Dysphagia: On chronic PPIs, patient concerned about long-term effects, we agreed to decrease PPIs dose and then stop. RTC 6 months

## 2016-07-31 NOTE — Progress Notes (Signed)
Pre visit review using our clinic review tool, if applicable. No additional management support is needed unless otherwise documented below in the visit note. 

## 2016-08-05 ENCOUNTER — Encounter: Payer: Self-pay | Admitting: Internal Medicine

## 2016-09-14 ENCOUNTER — Other Ambulatory Visit: Payer: Self-pay | Admitting: Internal Medicine

## 2016-09-15 ENCOUNTER — Encounter: Payer: Self-pay | Admitting: Internal Medicine

## 2016-09-16 ENCOUNTER — Telehealth: Payer: Self-pay | Admitting: Internal Medicine

## 2016-09-16 MED ORDER — METOPROLOL SUCCINATE ER 25 MG PO TB24: 25.0000 mg | ORAL_TABLET | Freq: Every day | ORAL | 1 refills | Status: DC

## 2016-09-16 NOTE — Telephone Encounter (Signed)
See message. Refill metoprolol 25  Received: Today  Message Contents  Colon Branch, MD  Damita Dunnings, [place]

## 2016-09-16 NOTE — Telephone Encounter (Signed)
Currently no Shingrix in- Pt has Medicare supplement- would have to get at pharmacy anyway.

## 2016-09-16 NOTE — Telephone Encounter (Signed)
Pt called in to request the shingles vac.   Is this okay to scheduled?

## 2016-09-16 NOTE — Telephone Encounter (Signed)
Called pt back to make him aware.

## 2016-09-16 NOTE — Telephone Encounter (Signed)
Rx sent 

## 2016-11-17 ENCOUNTER — Ambulatory Visit (INDEPENDENT_AMBULATORY_CARE_PROVIDER_SITE_OTHER): Payer: Medicare Other | Admitting: Internal Medicine

## 2016-11-17 ENCOUNTER — Encounter: Payer: Self-pay | Admitting: Internal Medicine

## 2016-11-17 VITALS — BP 124/66 | HR 60 | Temp 97.5°F | Resp 14 | Ht 64.0 in | Wt 152.1 lb

## 2016-11-17 DIAGNOSIS — M545 Low back pain, unspecified: Secondary | ICD-10-CM

## 2016-11-17 NOTE — Assessment & Plan Note (Signed)
Lumbar sprain: Recommend topical heat or cold, Salonpas topical, Tylenol primarily and if not better Aleve for few days. GI precautions discussed.

## 2016-11-17 NOTE — Progress Notes (Signed)
Subjective:    Patient ID: Michael Johns, male    DOB: 04/13/1932, 83 y.o.   MRN: [id number]  DOS:  11/17/2016 Type of visit - description : acute Interval history: Sx started 5 days ago shortly after he did some unusual lifting and pulling . Pain is located at the low back, no radiation. He denies fever, chills, bladder or bowel incontinence. There is no tenderness to palpation. Symptoms decreased to some extent with salonpas topical patches, Aleve and aspirin. Today he is almost symptom-free after he took some aspirin   Review of Systems See above  Past Medical History:  Diagnosis Date  . Abdominal pain 11/09   due to constipation and ?pancreatitis- admitted  . ADENOCARCINOMA, PROSTATE 07/01/2006  . Atypical chest pain 04/01/2015  . Constipation    chronic, impaction ~2012 (admited) and 01-2015  . GERD (gastroesophageal reflux disease)   . Gout    see 09-2008 note  . Hearing decreased    right, 40% x years  . [place], MILD 07/18/2009  . HYPERTENSION 05/19/2006    Past Surgical History:  Procedure Laterality Date  . APPENDECTOMY    . cataracts Bilateral 2014  . INGUINAL HERNIA REPAIR Right 11/2007  . POLYPECTOMY    . PROSTATECTOMY  05/2007   LADS (-) Bone scan (-)  . Rt middle ear surgery    . sinus polyp removed    . TONSILLECTOMY      Social History   Social History  . Marital status: Married    Spouse name: N/A  . Number of children: 0  . Years of education: N/A   Occupational History  . retired  Retired   Social History Main Topics  . Smoking status: Never Smoker  . Smokeless tobacco: Never Used  . Alcohol use Yes     Comment: socially   . Drug use: No  . Sexual activity: Not on file   Other Topics Concern  . Not on file   Social History Narrative   Re-married, no children, Joycelyn Schmid , present wife has 3 daughters      retired-- worked for the [employer] as of 11/17/2016      Reactions   Cephalexin Hives      Medication  List       Accurate as of 11/17/16  3:21 PM. Always use your most recent med list.          amLODipine 5 MG tablet Commonly known as:  NORVASC Take 1 tablet (5 mg total) by mouth daily.   aspirin 81 MG tablet Take 81 mg by mouth daily.   atorvastatin 10 MG tablet Commonly known as:  LIPITOR Take 1 tablet (10 mg total) by mouth daily.   Co-Enzyme Q10 200 MG Caps Take 1 capsule by mouth daily.   hydrochlorothiazide 12.5 MG tablet Commonly known as:  HYDRODIURIL Take 1 tablet (12.5 mg total) by mouth daily.   lactulose (encephalopathy) 10 GM/15ML Soln Commonly known as:  ENULOSE Take 30 mLs (20 g total) by mouth daily as needed.   metoprolol succinate 25 MG 24 hr tablet Commonly known as:  TOPROL-XL Take 1 tablet (25 mg total) by mouth daily.   multivitamin tablet Take 1 tablet by mouth daily.   omeprazole 20 MG capsule Commonly known as:  PRILOSEC Take 1 capsule (20 mg total) by mouth daily.   polyethylene glycol packet Commonly known as:  MIRALAX / GLYCOLAX Take 17 g by mouth as needed. Reported  on 06/15/2015   PROBIOTIC-10 PO Take 1 capsule by mouth daily.   senna 8.6 MG tablet Commonly known as:  SENOKOT Take 1 tablet by mouth as needed. Reported on 06/15/2015          Objective:   Physical Exam BP 124/66 (BP Location: Left Arm, Patient Position: Sitting, Cuff Size: Small)   Pulse 60   Temp (!) 97.5 F (36.4 C) (Oral)   Resp 14   Ht 5\' 4"  (1.626 m)   Wt 152 lb 2 oz (69 kg)   SpO2 98%   BMI 26.11 kg/m  General:   Well developed, well nourished . NAD.  HEENT:  Normocephalic . Face symmetric, atraumatic MSK: No TTP at the lumbar spine or sacroiliac joints Skin: Not pale. Not jaundice Neurologic:  alert & oriented X3.  Speech normal, gait appropriate for age and unassisted. Gait and posture not antalgic, DTRs symmetric, straight leg test negative Psych--  Cognition and judgment appear intact.  Cooperative with normal attention span and  concentration.  Behavior appropriate. No anxious or depressed appearing.      Assessment & Plan:   Assessment HTN Hyperlipidemia Leg cramps-- lipitor d/c in the past, cramps decreased, back on lipitor as cramps are  mild and benefits > risks. CoQ10 helps, CKs normal. rx robaxin 03-2015 Prostate cancer-- prostatectomy 05-2007, urology stopped f/u,  PSA checked by PCP [facility]: GERD,  --Dysphagia-- Rx  EGD 02-2014 (declined,sx chronic, mild) --H. pylori positive, re-checked breathing test negative 06-2015 --Chronic mild constipation, fecal impaction 2012 (admited per pt) and 01-2015 (ER): on MiraLAX qd, senokot or lactulose prn HOH   CP- saw cards, (-) stress test (EET) 10-2015   PLAN: Lumbar sprain: Recommend topical heat or cold, Salonpas topical, Tylenol primarily and if not better Aleve for few days. GI precautions discussed.

## 2016-11-17 NOTE — Patient Instructions (Signed)
Heat or cold  Ok Salonpas  Tylenol  500 mg OTC 2 tabs a day every 8 hours as needed for pain  ALEVE OTC (220 mg) twice a day , always take it with food because may cause gastritis and ulcers.  If you notice nausea, stomach pain, change in the color of stools --->  Stop the medicine and let us know  Don't take aleve for more than 1 week, try tylenol first  Call if no better in 2 weeks    Back Exercises If you have pain in your back, do these exercises 2-3 times each day or as told by your doctor. When the pain goes away, do the exercises once each day, but repeat the steps more times for each exercise (do more repetitions). If you do not have pain in your back, do these exercises once each day or as told by your doctor. Exercises Single Knee to Chest  Do these steps 3-5 times in a row for each leg: 1. Lie on your back on a firm bed or the floor with your legs stretched out. 2. Bring one knee to your chest. 3. Hold your knee to your chest by grabbing your knee or thigh. 4. Pull on your knee until you feel a gentle stretch in your lower back. 5. Keep doing the stretch for 10-30 seconds. 6. Slowly let go of your leg and straighten it.  Pelvic Tilt  Do these steps 5-10 times in a row: 1. Lie on your back on a firm bed or the floor with your legs stretched out. 2. Bend your knees so they point up to the ceiling. Your feet should be flat on the floor. 3. Tighten your lower belly (abdomen) muscles to press your lower back against the floor. This will make your tailbone point up to the ceiling instead of pointing down to your feet or the floor. 4. Stay in this position for 5-10 seconds while you gently tighten your muscles and breathe evenly.  Cat-Cow  Do these steps until your lower back bends more easily: 1. Get on your hands and knees on a firm surface. Keep your hands under your shoulders, and keep your knees under your hips. You may put padding under your knees. 2. Let your head  hang down, and make your tailbone point down to the floor so your lower back is round like the back of a cat. 3. Stay in this position for 5 seconds. 4. Slowly lift your head and make your tailbone point up to the ceiling so your back hangs low (sags) like the back of a cow. 5. Stay in this position for 5 seconds.  Press-Ups  Do these steps 5-10 times in a row: 1. Lie on your belly (face-down) on the floor. 2. Place your hands near your head, about shoulder-width apart. 3. While you keep your back relaxed and keep your hips on the floor, slowly straighten your arms to raise the top half of your body and lift your shoulders. Do not use your back muscles. To make yourself more comfortable, you may change where you place your hands. 4. Stay in this position for 5 seconds. 5. Slowly return to lying flat on the floor.  Bridges  Do these steps 10 times in a row: 1. Lie on your back on a firm surface. 2. Bend your knees so they point up to the ceiling. Your feet should be flat on the floor. 3. Tighten your butt muscles and lift your butt off  of the floor until your waist is almost as high as your knees. If you do not feel the muscles working in your butt and the back of your thighs, slide your feet 1-2 inches farther away from your butt. 4. Stay in this position for 3-5 seconds. 5. Slowly lower your butt to the floor, and let your butt muscles relax.  If this exercise is too easy, try doing it with your arms crossed over your chest. Belly Crunches  Do these steps 5-10 times in a row: 1. Lie on your back on a firm bed or the floor with your legs stretched out. 2. Bend your knees so they point up to the ceiling. Your feet should be flat on the floor. 3. Cross your arms over your chest. 4. Tip your chin a little bit toward your chest but do not bend your neck. 5. Tighten your belly muscles and slowly raise your chest just enough to lift your shoulder blades a tiny bit off of the  floor. 6. Slowly lower your chest and your head to the floor.  Back Lifts Do these steps 5-10 times in a row: 1. Lie on your belly (face-down) with your arms at your sides, and rest your forehead on the floor. 2. Tighten the muscles in your legs and your butt. 3. Slowly lift your chest off of the floor while you keep your hips on the floor. Keep the back of your head in line with the curve in your back. Look at the floor while you do this. 4. Stay in this position for 3-5 seconds. 5. Slowly lower your chest and your face to the floor.  Contact a doctor if:  Your back pain gets a lot worse when you do an exercise.  Your back pain does not lessen 2 hours after you exercise. If you have any of these problems, stop doing the exercises. Do not do them again unless your doctor says it is okay. Get help right away if:  You have sudden, very bad back pain. If this happens, stop doing the exercises. Do not do them again unless your doctor says it is okay. This information is not intended to replace advice given to you by your health care provider. Make sure you discuss any questions you have with your health care provider. Document Released: 02/15/2010 Document Revised: 06/21/2015 Document Reviewed: 03/09/2014 Elsevier Interactive Patient Education  [employer].

## 2016-11-17 NOTE — Progress Notes (Signed)
Pre visit review using our clinic review tool, if applicable. No additional management support is needed unless otherwise documented below in the visit note. 

## 2017-02-04 ENCOUNTER — Ambulatory Visit (INDEPENDENT_AMBULATORY_CARE_PROVIDER_SITE_OTHER): Payer: Medicare Other | Admitting: Internal Medicine

## 2017-02-04 ENCOUNTER — Encounter: Payer: Self-pay | Admitting: Internal Medicine

## 2017-02-04 VITALS — BP 118/76 | HR 74 | Temp 98.1°F | Resp 14 | Ht 64.0 in | Wt 152.4 lb

## 2017-02-04 DIAGNOSIS — E785 Hyperlipidemia, unspecified: Secondary | ICD-10-CM

## 2017-02-04 DIAGNOSIS — I10 Essential (primary) hypertension: Secondary | ICD-10-CM | POA: Diagnosis not present

## 2017-02-04 DIAGNOSIS — K5909 Other constipation: Secondary | ICD-10-CM | POA: Diagnosis not present

## 2017-02-04 LAB — COMPREHENSIVE METABOLIC PANEL
ALT: 15 U/L (ref 0–53)
AST: 20 U/L (ref 0–37)
Albumin: 4.4 g/dL (ref 3.5–5.2)
Alkaline Phosphatase: 60 U/L (ref 39–117)
BUN: 17 mg/dL (ref 6–23)
CALCIUM: 10 mg/dL (ref 8.4–10.5)
CHLORIDE: 98 meq/L (ref 96–112)
CO2: 33 mEq/L — ABNORMAL HIGH (ref 19–32)
CREATININE: 1.18 mg/dL (ref 0.40–1.50)
GFR: 62.69 mL/min (ref >60.00)
Glucose, Bld: 109 mg/dL — ABNORMAL HIGH (ref 70–99)
Potassium: 5.2 mEq/L — ABNORMAL HIGH (ref 3.5–5.1)
SODIUM: 139 meq/L (ref 135–145)
Total Bilirubin: 0.7 mg/dL (ref 0.2–1.2)
Total Protein: 7.8 g/dL (ref 6.0–8.3)

## 2017-02-04 LAB — LIPID PANEL
CHOL/HDL RATIO: 3
Cholesterol: 156 mg/dL (ref 0–200)
HDL: 45.8 mg/dL (ref >39.00)
LDL CALC: 87 mg/dL (ref 0–99)
NonHDL: 109.79
TRIGLYCERIDES: 115 mg/dL (ref 0.0–149.0)
VLDL: 23 mg/dL (ref 0.0–40.0)

## 2017-02-04 NOTE — Progress Notes (Signed)
Pre visit review using our clinic review tool, if applicable. No additional management support is needed unless otherwise documented below in the visit note. 

## 2017-02-04 NOTE — Patient Instructions (Signed)
GO TO THE LAB : Get the blood work     GO TO THE FRONT DESK Schedule your next appointment for a physical exam in 6 months.  Continue lactulose every 3 days Okay to take MiraLAX daily so desired

## 2017-02-04 NOTE — Progress Notes (Signed)
Subjective:    Patient ID: Michael Johns, male    DOB: 1932/11/26, 84 y.o.   MRN: [id number]  DOS:  02/04/2017 Type of visit - description : rov Interval history: HTN: Good compliance with medication.  BPs check approximately once a week and they are okay High cholesterol: Good compliance with statins, due for labs Constipation, chronic, takes lactulose every 3th day. Chest pain: Chronic issue again described the pain as left anterior chest, at rest, not exertional, once every 7-10 days, dull in nature, no associated shortness of breath.   Review of Systems No fever chills No cough No nausea, vomiting, or blood in the stools  Past Medical History:  Diagnosis Date  . Abdominal pain 11/09   due to constipation and ?pancreatitis- admitted  . ADENOCARCINOMA, PROSTATE 07/01/2006  . Atypical chest pain 04/01/2015  . Constipation    chronic, impaction ~2012 (admited) and 01-2015  . GERD (gastroesophageal reflux disease)   . Gout    see 09-2008 note  . Hearing decreased    right, 40% x years  . [place], MILD 07/18/2009  . HYPERTENSION 05/19/2006    Past Surgical History:  Procedure Laterality Date  . APPENDECTOMY    . cataracts Bilateral 2014  . INGUINAL HERNIA REPAIR Right 11/2007  . POLYPECTOMY    . PROSTATECTOMY  05/2007   LADS (-) Bone scan (-)  . Rt middle ear surgery    . sinus polyp removed    . TONSILLECTOMY      Social History   Socioeconomic History  . Marital status: Married    Spouse name: Not on file  . Number of children: 0  . Years of education: Not on file  . Highest education level: Not on file  Social Needs  . Financial resource strain: Not on file  . Food insecurity - worry: Not on file  . Food insecurity - inability: Not on file  . Transportation needs - medical: Not on file  . Transportation needs - non-medical: Not on file  Occupational History  . Occupation: retired     Fish farm manager: RETIRED  Tobacco Use  . Smoking status: Never Smoker  .  Smokeless tobacco: Never Used  Substance and Sexual Activity  . Alcohol use: Yes    Comment: socially   . Drug use: No  . Sexual activity: Not on file  Other Topics Concern  . Not on file  Social History Narrative   Re-married, no children, Joycelyn Schmid , present wife has 3 daughters      retired-- worked for the [employer] as of 02/04/2017      Reactions   Cephalexin Hives      Medication List        Accurate as of 02/04/17  8:46 AM. Always use your most recent med list.          amLODipine 5 MG tablet Commonly known as:  NORVASC Take 1 tablet (5 mg total) by mouth daily.   aspirin 81 MG tablet Take 81 mg by mouth daily.   atorvastatin 10 MG tablet Commonly known as:  LIPITOR Take 1 tablet (10 mg total) by mouth daily.   Co-Enzyme Q10 200 MG Caps Take 1 capsule by mouth daily.   hydrochlorothiazide 12.5 MG tablet Commonly known as:  HYDRODIURIL Take 1 tablet (12.5 mg total) by mouth daily.   lactulose (encephalopathy) 10 GM/15ML Soln Commonly known as:  ENULOSE Take 30 mLs (20 g total) by mouth daily as  needed.   metoprolol succinate 25 MG 24 hr tablet Commonly known as:  TOPROL-XL Take 1 tablet (25 mg total) by mouth daily.   multivitamin tablet Take 1 tablet by mouth daily.   omeprazole 20 MG capsule Commonly known as:  PRILOSEC Take 1 capsule (20 mg total) by mouth daily.   polyethylene glycol packet Commonly known as:  MIRALAX / GLYCOLAX Take 17 g by mouth as needed. Reported on 06/15/2015   PROBIOTIC-10 PO Take 1 capsule by mouth daily.   senna 8.6 MG tablet Commonly known as:  SENOKOT Take 1 tablet by mouth as needed. Reported on 06/15/2015          Objective:   Physical Exam BP 118/76 (BP Location: Left Arm, Patient Position: Sitting, Cuff Size: Small)   Pulse 74   Temp 98.1 F (36.7 C) (Oral)   Resp 14   Ht 5\' 4"  (1.626 m)   Wt 152 lb 6 oz (69.1 kg)   SpO2 94%   BMI 26.16 kg/m  General:   Well developed, well nourished  . NAD.  HEENT:  Normocephalic . Face symmetric, atraumatic Lungs:  CTA B Normal respiratory effort, no intercostal retractions, no accessory muscle use. Heart: RRR,  no murmur.  No pretibial edema bilaterally  Skin: Not pale. Not jaundice Neurologic:  alert & oriented X3.  Speech normal, gait appropriate for age and unassisted Psych--  Cognition and judgment appear intact.  Cooperative with normal attention span and concentration.  Behavior appropriate. No anxious or depressed appearing.      Assessment & Plan:   Assessment HTN Hyperlipidemia Leg cramps-- lipitor d/c in the past, cramps decreased, back on lipitor as cramps are  mild and benefits > risks. CoQ10 helps, CKs normal. rx robaxin 03-2015 Prostate cancer-- prostatectomy 05-2007, urology stopped f/u,  PSA checked by PCP [facility]: GERD,  --Dysphagia-- Rx  EGD 02-2014 (declined,sx chronic, mild) --H. pylori positive, re-checked breathing test negative 06-2015 --Chronic mild constipation, fecal impaction 2012 (admited per pt) and 01-2015 (ER): on MiraLAX qd, senokot or lactulose prn HOH   CP- saw cards, (-) stress test (EET) 10-2015   PLAN: HTN: Continue amlodipine, HCTZ, metoprolol.  Check a CMP.  Patient again somewhat concerned about his creatinine level, GFR remains appropriate. Hyperlipidemia: Continue with Lipitor, check a FLP Chronic constipation: Currently patient takes lactulose 30 mL every 3 days with good results, wonders if he could take MiraLAX daily in addition to lactulose, I am not opposed. Chest pain: Chronic, stable, same features as 2 years ago.  2017 EET negative.  Rx observation. RTC CPX 6 months

## 2017-02-04 NOTE — Assessment & Plan Note (Signed)
HTN: Continue amlodipine, HCTZ, metoprolol.  Check a CMP.  Patient again somewhat concerned about his creatinine level, GFR remains appropriate. Hyperlipidemia: Continue with Lipitor, check a FLP Chronic constipation: Currently patient takes lactulose 30 mL every 3 days with good results, wonders if he could take MiraLAX daily in addition to lactulose, I am not opposed. Chest pain: Chronic, stable, same features as 2 years ago.  2017 EET negative.  Rx observation. RTC CPX 6 months

## 2017-02-18 ENCOUNTER — Encounter: Payer: Self-pay | Admitting: Internal Medicine

## 2017-05-03 ENCOUNTER — Other Ambulatory Visit: Payer: Self-pay | Admitting: Internal Medicine

## 2017-06-01 ENCOUNTER — Other Ambulatory Visit: Payer: Self-pay | Admitting: Internal Medicine

## 2017-08-06 ENCOUNTER — Encounter: Payer: Self-pay | Admitting: Internal Medicine

## 2017-08-06 ENCOUNTER — Ambulatory Visit (INDEPENDENT_AMBULATORY_CARE_PROVIDER_SITE_OTHER): Payer: Medicare Other | Admitting: Internal Medicine

## 2017-08-06 VITALS — BP 126/74 | HR 76 | Temp 98.1°F | Resp 16 | Ht 64.0 in | Wt 153.4 lb

## 2017-08-06 DIAGNOSIS — Z0000 Encounter for general adult medical examination without abnormal findings: Secondary | ICD-10-CM

## 2017-08-06 DIAGNOSIS — Z23 Encounter for immunization: Secondary | ICD-10-CM | POA: Diagnosis not present

## 2017-08-06 DIAGNOSIS — C61 Malignant neoplasm of prostate: Secondary | ICD-10-CM | POA: Diagnosis not present

## 2017-08-06 DIAGNOSIS — E875 Hyperkalemia: Secondary | ICD-10-CM

## 2017-08-06 DIAGNOSIS — E785 Hyperlipidemia, unspecified: Secondary | ICD-10-CM

## 2017-08-06 LAB — HEMOGLOBIN A1C: HEMOGLOBIN A1C: 5.6 % (ref 4.6–6.5)

## 2017-08-06 LAB — CBC WITH DIFFERENTIAL/PLATELET
BASOS ABS: 0.1 10*3/uL (ref 0.0–0.1)
BASOS PCT: 1 % (ref 0.0–3.0)
EOS ABS: 0.3 10*3/uL (ref 0.0–0.7)
Eosinophils Relative: 4.7 % (ref 0.0–5.0)
HEMATOCRIT: 40.7 % (ref 39.0–52.0)
HEMOGLOBIN: 14.5 g/dL (ref 13.0–17.0)
Lymphocytes Relative: 29.2 % (ref 12.0–46.0)
Lymphs Abs: 2 10*3/uL (ref 0.7–4.0)
MCHC: 35.6 g/dL (ref 30.0–36.0)
MCV: 87.8 fl (ref 78.0–100.0)
MONO ABS: 0.9 10*3/uL (ref 0.1–1.0)
Monocytes Relative: 12.3 % — ABNORMAL HIGH (ref 3.0–12.0)
NEUTROS ABS: 3.7 10*3/uL (ref 1.4–7.7)
Neutrophils Relative %: 52.8 % (ref 43.0–77.0)
PLATELETS: 273 10*3/uL (ref 150.0–400.0)
RBC: 4.63 Mil/uL (ref 4.22–5.81)
RDW: 14.2 % (ref 11.5–15.5)
WBC: 7 10*3/uL (ref 4.0–10.5)

## 2017-08-06 LAB — BASIC METABOLIC PANEL
BUN: 14 mg/dL (ref 6–23)
CO2: 30 mEq/L (ref 19–32)
Calcium: 10 mg/dL (ref 8.4–10.5)
Chloride: 92 mEq/L — ABNORMAL LOW (ref 96–112)
Creatinine, Ser: 1.11 mg/dL (ref 0.40–1.50)
GFR: 67.19 mL/min (ref >60.00)
Glucose, Bld: 90 mg/dL (ref 70–99)
Potassium: 5.7 mEq/L — ABNORMAL HIGH (ref 3.5–5.1)
Sodium: 130 mEq/L — ABNORMAL LOW (ref 135–145)

## 2017-08-06 LAB — PSA: PSA: 0 ng/mL — AB (ref 0.10–4.00)

## 2017-08-06 LAB — TSH: TSH: 4.71 u[IU]/mL — AB (ref 0.35–4.50)

## 2017-08-06 NOTE — Progress Notes (Signed)
Pre visit review using our clinic review tool, if applicable. No additional management support is needed unless otherwise documented below in the visit note. 

## 2017-08-06 NOTE — Progress Notes (Signed)
Subjective:    Patient ID: Michael Johns, male    DOB: [birth year], 84 y.o.   MRN: [id number]  DOS:  08/06/2017 Type of visit - description : cpx Interval history: Feels well, no major concerns.   Review of Systems No LUTS, occasionally he strains a little.  Other than above, a 14 point review of systems is negative     Past Medical History:  Diagnosis Date  . Abdominal pain 11/09   due to constipation and ?pancreatitis- admitted  . ADENOCARCINOMA, PROSTATE 07/01/2006  . Atypical chest pain 04/01/2015  . Constipation    chronic, impaction ~2012 (admited) and 01-2015  . GERD (gastroesophageal reflux disease)   . Gout    see 09-2008 note  . Hearing decreased    right, 40% x years  . [place], MILD 07/18/2009  . HYPERTENSION 05/19/2006    Past Surgical History:  Procedure Laterality Date  . APPENDECTOMY    . cataracts Bilateral 2014  . INGUINAL HERNIA REPAIR Right 11/2007  . POLYPECTOMY    . PROSTATECTOMY  05/2007   LADS (-) Bone scan (-)  . Rt middle ear surgery    . sinus polyp removed    . TONSILLECTOMY      Social History   Socioeconomic History  . Marital status: Married    Spouse name: Not on file  . Number of children: 0  . Years of education: Not on file  . Highest education level: Not on file  Occupational History  . Occupation: retired     Fish farm manager: RETIRED  Social Needs  . Financial resource strain: Not on file  . Food insecurity:    Worry: Not on file    Inability: Not on file  . Transportation needs:    Medical: Not on file    Non-medical: Not on file  Tobacco Use  . Smoking status: Never Smoker  . Smokeless tobacco: Never Used  Substance and Sexual Activity  . Alcohol use: Yes    Comment: socially   . Drug use: No  . Sexual activity: Not on file  Lifestyle  . Physical activity:    Days per week: Not on file    Minutes per session: Not on file  . Stress: Not on file  Relationships  . Social connections:    Talks on phone: Not on  file    Gets together: Not on file    Attends religious service: Not on file    Active member of club or organization: Not on file    Attends meetings of clubs or organizations: Not on file    Relationship status: Not on file  . Intimate partner violence:    Fear of current or ex partner: Not on file    Emotionally abused: Not on file    Physically abused: Not on file    Forced sexual activity: Not on file  Other Topics Concern  . Not on file  Social History Narrative   Re-married, no children, Joycelyn Schmid , present wife has 3 daughters      retired-- worked for the [employer] History  Problem Relation Age of Onset  . Hypertension Mother   . Diabetes Mother   . Heart disease Mother        CABG 43 y/o  . Esophageal cancer Father   . Heart disease Maternal Grandmother        MI  . Leukemia Maternal Grandfather   . Cancer Other  all paternal uncles-types unknown  . Prostate cancer Neg Hx   . Colon cancer Neg Hx   . Rectal cancer Neg Hx   . Stomach cancer Neg Hx      Allergies as of 08/06/2017      Reactions   Cephalexin Hives      Medication List        Accurate as of 08/06/17 11:59 PM. Always use your most recent med list.          amLODipine 5 MG tablet Commonly known as:  NORVASC Take 1 tablet (5 mg total) by mouth daily.   aspirin 81 MG tablet Take 81 mg by mouth daily.   atorvastatin 10 MG tablet Commonly known as:  LIPITOR Take 1 tablet (10 mg total) by mouth daily.   Co-Enzyme Q10 200 MG Caps Take 1 capsule by mouth daily.   hydrochlorothiazide 12.5 MG tablet Commonly known as:  HYDRODIURIL Take 1 tablet (12.5 mg total) by mouth daily.   lactulose (encephalopathy) 10 GM/15ML Soln Commonly known as:  ENULOSE Take 30 mLs (20 g total) by mouth daily as needed.   metoprolol succinate 25 MG 24 hr tablet Commonly known as:  TOPROL-XL Take 1 tablet (25 mg total) by mouth daily.   multivitamin tablet Take 1 tablet by mouth daily.     omeprazole 20 MG capsule Commonly known as:  PRILOSEC Take 1 capsule (20 mg total) by mouth daily.   polyethylene glycol packet Commonly known as:  MIRALAX / GLYCOLAX Take 17 g by mouth as needed. Reported on 06/15/2015   PROBIOTIC-10 PO Take 1 capsule by mouth daily.   senna 8.6 MG tablet Commonly known as:  SENOKOT Take 1 tablet by mouth as needed. Reported on 06/15/2015          Objective:   Physical Exam BP 126/74 (BP Location: Left Arm, Patient Position: Sitting, Cuff Size: Small)   Pulse 76   Temp 98.1 F (36.7 C) (Oral)   Resp 16   Ht 5\' 4"  (1.626 m)   Wt 153 lb 6 oz (69.6 kg)   SpO2 97%   BMI 26.33 kg/m  General: Well developed, NAD, see BMI.  Neck: No  thyromegaly  HEENT:  Normocephalic . Face symmetric, atraumatic Lungs:  CTA B Normal respiratory effort, no intercostal retractions, no accessory muscle use. Heart: RRR,  no murmur.  No pretibial edema bilaterally  Abdomen:  Not distended, soft, non-tender. No rebound or rigidity.   Skin: Exposed areas without rash. Not pale. Not jaundice Neurologic:  alert & oriented X3.  Speech normal, gait appropriate for age and unassisted Strength symmetric and appropriate for age.  Psych: Cognition and judgment appear intact.  Cooperative with normal attention span and concentration.  Behavior appropriate. No anxious or depressed appearing.     Assessment & Plan:    Assessment HTN Hyperlipidemia Leg cramps-- lipitor d/c in the past, cramps decreased, back on lipitor as cramps are  mild and benefits > risks. CoQ10 helps, CKs normal. rx robaxin 03-2015 Prostate cancer-- prostatectomy 05-2007, urology stopped f/u,  PSA checked by PCP [facility]: GERD,  --Dysphagia-- Rx  EGD 02-2014 (declined,sx chronic, mild) --H. pylori positive, re-checked breathing test negative 06-2015 --Chronic mild constipation, fecal impaction 2012 (admited per pt) and 01-2015 (ER): on MiraLAX qd, senokot or lactulose prn HOH   CP- saw  cards, (-) stress test (EET) 10-2015   PLAN: HTN: Continue amlodipine, HCTZ, last potassium slightly elevated, rechecking labs Hyperlipidemia: Well-controlled on Lipitor Prostate cancer:  Check a PSA Chronic constipation: Well-controlled with MiraLAX, hardly ever uses other meds. RTC 6 months

## 2017-08-06 NOTE — Patient Instructions (Signed)
GO TO THE LAB : Get the blood work     GO TO THE FRONT DESK Schedule your next appointment for a checkup in 6 months   You had both the old and new shingles immunizations already   Check the  blood pressure 2 or 3 times a month   Be sure your blood pressure is between 110/65 and  135/85. If it is consistently higher or lower, let me know

## 2017-08-06 NOTE — Assessment & Plan Note (Signed)
--  Td 2013; pneumonia shot 2009; prevnar 2015; zostavax 2-09; s/p  shingrex x 2  --Cscope 10-08, Adenomatous polyp, repeated cscope again 03-2011 Normal, was rec no f/u --Diet: ok but room for improvment  ; exercises regularly except for the last few weeks (wife's health); plays  golf   --Labs: BMP, CBC, PSA, .  Blood sugar was a slightly elevated, check a A1c.

## 2017-08-07 NOTE — Assessment & Plan Note (Signed)
HTN: Continue amlodipine, HCTZ, last potassium slightly elevated, rechecking labs Hyperlipidemia: Well-controlled on Lipitor Prostate cancer: Check a PSA Chronic constipation: Well-controlled with MiraLAX, hardly ever uses other meds. RTC 6 months

## 2017-08-08 ENCOUNTER — Encounter: Payer: Self-pay | Admitting: Internal Medicine

## 2017-08-10 ENCOUNTER — Other Ambulatory Visit (INDEPENDENT_AMBULATORY_CARE_PROVIDER_SITE_OTHER): Payer: Medicare Other

## 2017-08-10 DIAGNOSIS — E875 Hyperkalemia: Secondary | ICD-10-CM

## 2017-08-10 LAB — BASIC METABOLIC PANEL
BUN: 12 mg/dL (ref 6–23)
CALCIUM: 9.3 mg/dL (ref 8.4–10.5)
CO2: 30 meq/L (ref 19–32)
CREATININE: 1.22 mg/dL (ref 0.40–1.50)
Chloride: 94 mEq/L — ABNORMAL LOW (ref 96–112)
GFR: 60.25 mL/min (ref >60.00)
Glucose, Bld: 105 mg/dL — ABNORMAL HIGH (ref 70–99)
Potassium: 3.5 mEq/L (ref 3.5–5.1)
SODIUM: 132 meq/L — AB (ref 135–145)

## 2017-09-14 ENCOUNTER — Other Ambulatory Visit: Payer: Self-pay | Admitting: Internal Medicine

## 2017-10-26 ENCOUNTER — Encounter: Payer: Self-pay | Admitting: Internal Medicine

## 2017-11-02 ENCOUNTER — Other Ambulatory Visit: Payer: Self-pay | Admitting: Internal Medicine

## 2017-11-28 ENCOUNTER — Other Ambulatory Visit: Payer: Self-pay | Admitting: Internal Medicine

## 2018-02-09 ENCOUNTER — Encounter: Payer: Self-pay | Admitting: Internal Medicine

## 2018-02-09 ENCOUNTER — Ambulatory Visit (INDEPENDENT_AMBULATORY_CARE_PROVIDER_SITE_OTHER): Payer: Medicare Other | Admitting: Internal Medicine

## 2018-02-09 ENCOUNTER — Ambulatory Visit: Payer: Medicare Other | Admitting: Internal Medicine

## 2018-02-09 VITALS — BP 126/60 | HR 58 | Temp 97.8°F | Resp 16 | Ht 64.0 in | Wt 154.1 lb

## 2018-02-09 DIAGNOSIS — I10 Essential (primary) hypertension: Secondary | ICD-10-CM

## 2018-02-09 DIAGNOSIS — E785 Hyperlipidemia, unspecified: Secondary | ICD-10-CM | POA: Diagnosis not present

## 2018-02-09 DIAGNOSIS — R7989 Other specified abnormal findings of blood chemistry: Secondary | ICD-10-CM

## 2018-02-09 LAB — LIPID PANEL
CHOLESTEROL: 158 mg/dL (ref 0–200)
HDL: 49.1 mg/dL (ref >39.00)
LDL Cholesterol: 80 mg/dL (ref 0–99)
NonHDL: 109.19
TRIGLYCERIDES: 148 mg/dL (ref 0.0–149.0)
Total CHOL/HDL Ratio: 3
VLDL: 29.6 mg/dL (ref 0.0–40.0)

## 2018-02-09 LAB — BASIC METABOLIC PANEL
BUN: 13 mg/dL (ref 6–23)
CO2: 28 mEq/L (ref 19–32)
Calcium: 9.8 mg/dL (ref 8.4–10.5)
Chloride: 96 mEq/L (ref 96–112)
Creatinine, Ser: 1.1 mg/dL (ref 0.40–1.50)
GFR: 67.81 mL/min (ref >60.00)
Glucose, Bld: 91 mg/dL (ref 70–99)
POTASSIUM: 4.8 meq/L (ref 3.5–5.1)
SODIUM: 135 meq/L (ref 135–145)

## 2018-02-09 LAB — ALT: ALT: 20 U/L (ref 0–53)

## 2018-02-09 LAB — T4, FREE: Free T4: 0.74 ng/dL (ref 0.60–1.60)

## 2018-02-09 LAB — TSH: TSH: 4.09 u[IU]/mL (ref 0.35–4.50)

## 2018-02-09 LAB — T3, FREE: T3, Free: 2.9 pg/mL (ref 2.3–4.2)

## 2018-02-09 LAB — AST: AST: 22 U/L (ref 0–37)

## 2018-02-09 NOTE — Assessment & Plan Note (Signed)
HTN: Continue amlodipine, HCTZ, metoprolol.  Check a BMP High cholesterol: Continue Lipitor, check a FLP, AST, ALT High potassium: Since the last visit, potassium was slightly elevated, he stopped taking some "mineral supplements".  Recheck today. Slightly elevated TSH: Check TFTs. RTC 6 months CPX

## 2018-02-09 NOTE — Patient Instructions (Addendum)
Please schedule Medicare Wellness with Glenard Haring.   GO TO THE LAB : Get the blood work     GO TO THE FRONT DESK Schedule your next appointment for a  Physical exam in 6 months

## 2018-02-09 NOTE — Progress Notes (Signed)
Subjective:    Patient ID: Michael Johns, male    DOB: 1932-06-11, 85 y.o.   MRN: [id number]  DOS:  02/09/2018 Type of visit - description: rov No concerns He remains active Good compliance with medications   Review of Systems Constipation is well controlled with as needed medications Denies chest pain difficulty breathing No nausea vomiting or blood in the stools  Past Medical History:  Diagnosis Date  . Abdominal pain 11/09   due to constipation and ?pancreatitis- admitted  . ADENOCARCINOMA, PROSTATE 07/01/2006  . Atypical chest pain 04/01/2015  . Constipation    chronic, impaction ~2012 (admited) and 01-2015  . GERD (gastroesophageal reflux disease)   . Gout    see 09-2008 note  . Hearing decreased    right, 40% x years  . [place], MILD 07/18/2009  . HYPERTENSION 05/19/2006    Past Surgical History:  Procedure Laterality Date  . APPENDECTOMY    . cataracts Bilateral 2014   lens implantinfo scanned (02-09-17)  . INGUINAL HERNIA REPAIR Right 11/2007  . POLYPECTOMY    . PROSTATECTOMY  05/2007   LADS (-) Bone scan (-)  . Rt middle ear surgery    . sinus polyp removed    . TONSILLECTOMY      Social History   Socioeconomic History  . Marital status: Married    Spouse name: Not on file  . Number of children: 0  . Years of education: Not on file  . Highest education level: Not on file  Occupational History  . Occupation: retired     Fish farm manager: RETIRED  Social Needs  . Financial resource strain: Not on file  . Food insecurity:    Worry: Not on file    Inability: Not on file  . Transportation needs:    Medical: Not on file    Non-medical: Not on file  Tobacco Use  . Smoking status: Never Smoker  . Smokeless tobacco: Never Used  Substance and Sexual Activity  . Alcohol use: Yes    Comment: socially   . Drug use: No  . Sexual activity: Not on file  Lifestyle  . Physical activity:    Days per week: Not on file    Minutes per session: Not on file  .  Stress: Not on file  Relationships  . Social connections:    Talks on phone: Not on file    Gets together: Not on file    Attends religious service: Not on file    Active member of club or organization: Not on file    Attends meetings of clubs or organizations: Not on file    Relationship status: Not on file  . Intimate partner violence:    Fear of current or ex partner: Not on file    Emotionally abused: Not on file    Physically abused: Not on file    Forced sexual activity: Not on file  Other Topics Concern  . Not on file  Social History Narrative   Re-married, no children, Joycelyn Schmid , present wife has 3 daughters      retired-- worked for the [employer] as of 02/09/2018      Reactions   Cephalexin Hives      Medication List       Accurate as of February 09, 2018  6:22 PM. Always use your most recent med list.        amLODipine 5 MG tablet Commonly known as:  NORVASC Take  1 tablet (5 mg total) by mouth daily.   aspirin 81 MG tablet Take 81 mg by mouth daily.   atorvastatin 10 MG tablet Commonly known as:  LIPITOR Take 1 tablet (10 mg total) by mouth daily.   Co-Enzyme Q10 200 MG Caps Take 1 capsule by mouth daily.   ENULOSE 10 GM/15ML Soln Generic drug:  lactulose (encephalopathy) TAKE 30 MLS BY MOUTH DAILY  AS NEEDED.   hydrochlorothiazide 12.5 MG tablet Commonly known as:  HYDRODIURIL Take 1 tablet (12.5 mg total) by mouth daily.   metoprolol succinate 25 MG 24 hr tablet Commonly known as:  TOPROL-XL Take 1 tablet (25 mg total) by mouth daily.   multivitamin tablet Take 1 tablet by mouth daily.   omeprazole 20 MG capsule Commonly known as:  PRILOSEC Take 1 capsule (20 mg total) by mouth daily.   polyethylene glycol packet Commonly known as:  MIRALAX / GLYCOLAX Take 17 g by mouth as needed. Reported on 06/15/2015   PROBIOTIC-10 PO Take 1 capsule by mouth daily.   senna 8.6 MG tablet Commonly known as:  SENOKOT Take 1 tablet by  mouth as needed. Reported on 06/15/2015           Objective:   Physical Exam BP 126/60 (BP Location: Left Arm, Patient Position: Sitting, Cuff Size: Small)   Pulse (!) 58   Temp 97.8 F (36.6 C) (Oral)   Resp 16   Ht 5\' 4"  (1.626 m)   Wt 154 lb 2 oz (69.9 kg)   SpO2 98%   BMI 26.46 kg/m  General:   Well developed, NAD, BMI noted. HEENT:  Normocephalic . Face symmetric, atraumatic Lungs:  CTA B Normal respiratory effort, no intercostal retractions, no accessory muscle use. Heart: RRR,  no murmur.  No pretibial edema bilaterally  Skin: Not pale. Not jaundice Neurologic:  alert & oriented X3.  Speech normal, gait appropriate for age and unassisted Psych--  Cognition and judgment appear intact.  Cooperative with normal attention span and concentration.  Behavior appropriate. No anxious or depressed appearing.      Assessment      Assessment HTN Hyperlipidemia Leg cramps-- lipitor d/c in the past, cramps decreased, back on lipitor as cramps are  mild and benefits > risks. CoQ10 helps, CKs normal. rx robaxin 03-2015 Prostate cancer-- prostatectomy 05-2007, urology stopped f/u,  PSA checked by PCP [facility] GI: GERD,  --Dysphagia-- Rx  EGD 02-2014 (declined,sx chronic, mild) --H. pylori positive, re-checked breathing test negative 06-2015 --Chronic mild constipation, fecal impaction 2012 (admited per pt) and 01-2015 (ER): on MiraLAX qd, senokot or lactulose prn HOH   CP- saw cards, (-) stress test (EET) 10-2015   PLAN: HTN: Continue amlodipine, HCTZ, metoprolol.  Check a BMP High cholesterol: Continue Lipitor, check a FLP, AST, ALT High potassium: Since the last visit, potassium was slightly elevated, he stopped taking some "mineral supplements".  Recheck today. Slightly elevated TSH: Check TFTs. RTC 6 months CPX III

## 2018-02-09 NOTE — Progress Notes (Signed)
Pre visit review using our clinic review tool, if applicable. No additional management support is needed unless otherwise documented below in the visit note. 

## 2018-02-15 ENCOUNTER — Encounter: Payer: Self-pay | Admitting: Internal Medicine

## 2018-02-15 ENCOUNTER — Ambulatory Visit (INDEPENDENT_AMBULATORY_CARE_PROVIDER_SITE_OTHER): Payer: Medicare Other | Admitting: Internal Medicine

## 2018-02-15 ENCOUNTER — Ambulatory Visit (HOSPITAL_BASED_OUTPATIENT_CLINIC_OR_DEPARTMENT_OTHER)
Admission: RE | Admit: 2018-02-15 | Discharge: 2018-02-15 | Disposition: A | Discharge status: Home / Self Care | Payer: Medicare Other | Source: Ambulatory Visit | Attending: Internal Medicine | Admitting: Internal Medicine

## 2018-02-15 VITALS — BP 126/78 | HR 57 | Temp 98.1°F | Resp 16 | Ht 64.0 in | Wt 153.5 lb

## 2018-02-15 DIAGNOSIS — R079 Chest pain, unspecified: Secondary | ICD-10-CM | POA: Diagnosis present

## 2018-02-15 NOTE — Progress Notes (Signed)
Pre visit review using our clinic review tool, if applicable. No additional management support is needed unless otherwise documented below in the visit note. 

## 2018-02-15 NOTE — Patient Instructions (Signed)
   STOP BY THE FIRST FLOOR:  get the XR    If you have severe chest pain: Go to the ER  If you have mild self-limited symptoms let me know

## 2018-02-15 NOTE — Progress Notes (Signed)
Subjective:    Patient ID: Michael Johns, male    DOB: 12-Nov-1932, 85 y.o.   MRN: [id number]  DOS:  02/15/2018 Type of visit - description: Acute visit He woke up at 5 AM in the morning today, went to the restroom, when he came back he developed pain located at the right upper extremity, by the R shoulder and right anterior chest. No radiation to the neck or to the left side. Associated symptoms: Slightly dizzy and clammy?  No other symptoms He called EMS, they did a EKG that was within normal.  They discussed ER visit  Or see PCP today, he elected to see PCP and that is why he is here. EMS recommended "blood test to rule out a heart attack". Pain self resolved within 30 minutes.  Review of Systems No fever chills No difficulty breathing, lower extremity edema. No abdominal pain or GERD No recent MSK injury or overdoing. No recent prolonged car trips or airplane trip No cough No recent airplane trip. Denies any stroke symptoms such as diplopia, slurred speech, motor deficit. No upper or lower extremity numbness.  Past Medical History:  Diagnosis Date  . Abdominal pain 11/09   due to constipation and ?pancreatitis- admitted  . ADENOCARCINOMA, PROSTATE 07/01/2006  . Atypical chest pain 04/01/2015  . Constipation    chronic, impaction ~2012 (admited) and 01-2015  . GERD (gastroesophageal reflux disease)   . Gout    see 09-2008 note  . Hearing decreased    right, 40% x years  . [place], MILD 07/18/2009  . HYPERTENSION 05/19/2006    Past Surgical History:  Procedure Laterality Date  . APPENDECTOMY    . cataracts Bilateral 2014   lens implantinfo scanned (02-09-17)  . INGUINAL HERNIA REPAIR Right 11/2007  . POLYPECTOMY    . PROSTATECTOMY  05/2007   LADS (-) Bone scan (-)  . Rt middle ear surgery    . sinus polyp removed    . TONSILLECTOMY      Social History   Socioeconomic History  . Marital status: Married    Spouse name: Not on file  . Number of children: 0  .  Years of education: Not on file  . Highest education level: Not on file  Occupational History  . Occupation: retired     Fish farm manager: RETIRED  Social Needs  . Financial resource strain: Not on file  . Food insecurity:    Worry: Not on file    Inability: Not on file  . Transportation needs:    Medical: Not on file    Non-medical: Not on file  Tobacco Use  . Smoking status: Never Smoker  . Smokeless tobacco: Never Used  Substance and Sexual Activity  . Alcohol use: Yes    Comment: socially   . Drug use: No  . Sexual activity: Not on file  Lifestyle  . Physical activity:    Days per week: Not on file    Minutes per session: Not on file  . Stress: Not on file  Relationships  . Social connections:    Talks on phone: Not on file    Gets together: Not on file    Attends religious service: Not on file    Active member of club or organization: Not on file    Attends meetings of clubs or organizations: Not on file    Relationship status: Not on file  . Intimate partner violence:    Fear of current or ex partner: Not on file  Emotionally abused: Not on file    Physically abused: Not on file    Forced sexual activity: Not on file  Other Topics Concern  . Not on file  Social History Narrative   Re-married, no children, Joycelyn Schmid , present wife has 3 daughters      retired-- worked for the [employer] as of 02/15/2018      Reactions   Cephalexin Hives      Medication List       Accurate as of February 15, 2018 11:59 PM. Always use your most recent med list.        amLODipine 5 MG tablet Commonly known as:  NORVASC Take 1 tablet (5 mg total) by mouth daily.   aspirin 81 MG tablet Take 81 mg by mouth daily.   atorvastatin 10 MG tablet Commonly known as:  LIPITOR Take 1 tablet (10 mg total) by mouth daily.   Co-Enzyme Q10 200 MG Caps Take 1 capsule by mouth daily.   ENULOSE 10 GM/15ML Soln Generic drug:  lactulose (encephalopathy) TAKE 30 MLS BY MOUTH  DAILY  AS NEEDED.   hydrochlorothiazide 12.5 MG tablet Commonly known as:  HYDRODIURIL Take 1 tablet (12.5 mg total) by mouth daily.   metoprolol succinate 25 MG 24 hr tablet Commonly known as:  TOPROL-XL Take 1 tablet (25 mg total) by mouth daily.   multivitamin tablet Take 1 tablet by mouth daily.   omeprazole 20 MG capsule Commonly known as:  PRILOSEC Take 1 capsule (20 mg total) by mouth daily.   polyethylene glycol packet Commonly known as:  MIRALAX / GLYCOLAX Take 17 g by mouth as needed. Reported on 06/15/2015   PROBIOTIC-10 PO Take 1 capsule by mouth daily.   senna 8.6 MG tablet Commonly known as:  SENOKOT Take 1 tablet by mouth as needed. Reported on 06/15/2015           Objective:   Physical Exam Skin:        BP 126/78 (BP Location: Left Arm, Patient Position: Sitting, Cuff Size: Small)   Pulse (!) 57   Temp 98.1 F (36.7 C) (Oral)   Resp 16   Ht 5\' 4"  (1.626 m)   Wt 153 lb 8 oz (69.6 kg)   SpO2 97%   BMI 26.35 kg/m  General:   Well developed, NAD, BMI noted.  HEENT:  Normocephalic . Face symmetric, atraumatic. Neck: No TTP at the cervical spine, range of motion normal Lungs:  CTA B Normal respiratory effort, no intercostal retractions, no accessory muscle use. Heart: RRR,  no murmur.  no pretibial edema bilaterally  Abdomen:  Not distended, soft, non-tender. No rebound or rigidity.   Skin: Not pale. Not jaundice MSK: Shoulders: Symmetric, range of motion normal without any pain. Chest wall no TTP Neurologic:  alert & oriented X3.  Speech normal, gait appropriate for age and unassisted Psych--  Cognition and judgment appear intact.  Cooperative with normal attention span and concentration.  Behavior appropriate. No anxious or depressed appearing.     Assessment      Assessment HTN Hyperlipidemia Leg cramps-- lipitor d/c in the past, cramps decreased, back on lipitor as cramps are  mild and benefits > risks. CoQ10 helps, CKs  normal. rx robaxin 03-2015 Prostate cancer-- prostatectomy 05-2007, urology stopped f/u,  PSA checked by PCP [facility] GI: GERD,  --Dysphagia-- Rx  EGD 02-2014 (declined,sx chronic, mild) --H. pylori positive, re-checked breathing test negative 06-2015 --Chronic mild constipation, fecal impaction 2012 (  admited per pt) and 01-2015 (ER): on MiraLAX qd, senokot or lactulose prn HOH   CP- saw cards, (-) stress test (EET) 10-2015   PLAN: Right-sided chest pain: EKG today nsr Etiology is unclear but is extremely unlikely this is a atypical presentation of CAD.  The patient was thinking about getting a troponin however I think chest x-ray is more helpful under the circumstances (pneumothorax?  Others?) Plan: Chest x-ray, observe, call or go to the ER if symptoms resurface or severe.

## 2018-02-16 ENCOUNTER — Encounter: Payer: Self-pay | Admitting: Internal Medicine

## 2018-02-16 NOTE — Assessment & Plan Note (Signed)
Right-sided chest pain: EKG today nsr Etiology is unclear but is extremely unlikely this is a atypical presentation of CAD.  The patient was thinking about getting a troponin however I think chest x-ray is more helpful under the circumstances (pneumothorax?  Others?) Plan: Chest x-ray, observe, call or go to the ER if symptoms resurface or severe.

## 2018-04-09 ENCOUNTER — Telehealth: Payer: Self-pay | Admitting: *Deleted

## 2018-04-09 NOTE — Telephone Encounter (Signed)
Received Dermatopathology Report results from [facility] ; forwarded to provider/SLS 03/13

## 2018-04-13 ENCOUNTER — Other Ambulatory Visit: Payer: Self-pay | Admitting: Internal Medicine

## 2018-04-14 DIAGNOSIS — C439 Malignant melanoma of skin, unspecified: Secondary | ICD-10-CM | POA: Insufficient documentation

## 2018-04-15 ENCOUNTER — Encounter: Payer: Self-pay | Admitting: Internal Medicine

## 2018-04-26 ENCOUNTER — Encounter: Payer: Self-pay | Admitting: Internal Medicine

## 2018-05-12 ENCOUNTER — Telehealth: Payer: Self-pay

## 2018-05-12 HISTORY — PX: MELANOMA EXCISION: SHX5266

## 2018-05-12 NOTE — Telephone Encounter (Signed)
LMOM informing Pt to return call to schedule virtual visit.

## 2018-05-13 NOTE — Telephone Encounter (Signed)
PT called back in and said that Michael Johns had said he could do a E visit but pt says he is fine, no need, no camera, mentioned telepone visit, pt said fine would see Michael Johns in July

## 2018-06-29 ENCOUNTER — Other Ambulatory Visit: Payer: Self-pay | Admitting: Internal Medicine

## 2018-07-08 ENCOUNTER — Other Ambulatory Visit: Payer: Self-pay | Admitting: Internal Medicine

## 2018-07-16 MED FILL — FLUOROURACIL 5 % CREA: 5 | 14 days supply | Qty: 1 | Fill #0

## 2018-08-25 ENCOUNTER — Other Ambulatory Visit: Payer: Self-pay

## 2018-08-25 ENCOUNTER — Encounter: Payer: Self-pay | Admitting: Internal Medicine

## 2018-08-25 ENCOUNTER — Ambulatory Visit (INDEPENDENT_AMBULATORY_CARE_PROVIDER_SITE_OTHER): Payer: Medicare Other | Admitting: Internal Medicine

## 2018-08-25 VITALS — BP 143/61 | HR 58 | Temp 98.2°F | Resp 18 | Ht 64.0 in | Wt 154.4 lb

## 2018-08-25 DIAGNOSIS — Z8546 Personal history of malignant neoplasm of prostate: Secondary | ICD-10-CM

## 2018-08-25 DIAGNOSIS — E785 Hyperlipidemia, unspecified: Secondary | ICD-10-CM

## 2018-08-25 DIAGNOSIS — Z0000 Encounter for general adult medical examination without abnormal findings: Secondary | ICD-10-CM | POA: Diagnosis not present

## 2018-08-25 LAB — CBC WITH DIFFERENTIAL/PLATELET
Basophils Absolute: 0.1 10*3/uL (ref 0.0–0.1)
Basophils Relative: 0.9 % (ref 0.0–3.0)
Eosinophils Absolute: 0.3 10*3/uL (ref 0.0–0.7)
Eosinophils Relative: 4.5 % (ref 0.0–5.0)
HCT: 41.5 % (ref 39.0–52.0)
Hemoglobin: 14.4 g/dL (ref 13.0–17.0)
Lymphocytes Relative: 28.8 % (ref 12.0–46.0)
Lymphs Abs: 2.1 10*3/uL (ref 0.7–4.0)
MCHC: 34.8 g/dL (ref 30.0–36.0)
MCV: 89.4 fl (ref 78.0–100.0)
Monocytes Absolute: 1 10*3/uL (ref 0.1–1.0)
Monocytes Relative: 13.6 % — ABNORMAL HIGH (ref 3.0–12.0)
Neutro Abs: 3.8 10*3/uL (ref 1.4–7.7)
Neutrophils Relative %: 52.2 % (ref 43.0–77.0)
Platelets: 266 10*3/uL (ref 150.0–400.0)
RBC: 4.64 Mil/uL (ref 4.22–5.81)
RDW: 14.2 % (ref 11.5–15.5)
WBC: 7.4 10*3/uL (ref 4.0–10.5)

## 2018-08-25 LAB — BASIC METABOLIC PANEL
BUN: 15 mg/dL (ref 6–23)
CO2: 31 mEq/L (ref 19–32)
Calcium: 9.8 mg/dL (ref 8.4–10.5)
Chloride: 97 mEq/L (ref 96–112)
Creatinine, Ser: 1.25 mg/dL (ref 0.40–1.50)
GFR: 54.98 mL/min — ABNORMAL LOW (ref >60.00)
Glucose, Bld: 100 mg/dL — ABNORMAL HIGH (ref 70–99)
Potassium: 4.4 mEq/L (ref 3.5–5.1)
Sodium: 134 mEq/L — ABNORMAL LOW (ref 135–145)

## 2018-08-25 LAB — TSH: TSH: 2.77 u[IU]/mL (ref 0.35–4.50)

## 2018-08-25 LAB — PSA: PSA: 0.01 ng/mL — ABNORMAL LOW (ref 0.10–4.00)

## 2018-08-25 NOTE — Assessment & Plan Note (Addendum)
--  Td 2013 - pneumonia shot 2009, 2019 - prevnar 2015 -zostavax 2-09 - s/p  shingrex x 2  - early flu shot this fall --Cscope 10-08, Adenomatous polyp, repeated cscope again 03-2011 Normal, was rec no f/u --History of prostate cancer, checking a PSA --Diet, exercise: Discussed, encouraged to stay active, take daily walks.  --Labs: BMP, CBC, TSH, PSA

## 2018-08-25 NOTE — Progress Notes (Signed)
Subjective:    Patient ID: Michael Johns, male    DOB: 05-30-1932, 85 y.o.   MRN: [id number]  DOS:  08/25/2018 Type of visit - description: cpx  No major concerns.   Review of Systems Reports occasional urinary leak with exercising or playing golf.  No other looks.  Other than above, a 14 point review of systems is negative     Past Medical History:  Diagnosis Date  . Abdominal pain 11/09   due to constipation and ?pancreatitis- admitted  . ADENOCARCINOMA, PROSTATE 07/01/2006  . Atypical chest pain 04/01/2015  . Constipation    chronic, impaction ~2012 (admited) and 01-2015  . GERD (gastroesophageal reflux disease)   . Gout    see 09-2008 note  . Hearing decreased    right, 40% x years  . [place], MILD 07/18/2009  . HYPERTENSION 05/19/2006  . Melanoma [facility])     Past Surgical History:  Procedure Laterality Date  . APPENDECTOMY    . cataracts Bilateral 2014   lens implantinfo scanned (02-09-17)  . INGUINAL HERNIA REPAIR Right 11/2007  . MELANOMA EXCISION Right 05/12/2018   R leg melanoma. Dr  Winifred Olive at the [facility] in [place].  Marland Kitchen POLYPECTOMY    . PROSTATECTOMY  05/2007   LADS (-) Bone scan (-)  . Rt middle ear surgery    . sinus polyp removed    . TONSILLECTOMY      Social History   Socioeconomic History  . Marital status: Married    Spouse name: Not on file  . Number of children: 0  . Years of education: Not on file  . Highest education level: Not on file  Occupational History  . Occupation: retired     Fish farm manager: RETIRED  Social Needs  . Financial resource strain: Not on file  . Food insecurity    Worry: Not on file    Inability: Not on file  . Transportation needs    Medical: Not on file    Non-medical: Not on file  Tobacco Use  . Smoking status: Never Smoker  . Smokeless tobacco: Never Used  Substance and Sexual Activity  . Alcohol use: Yes    Comment: socially   . Drug use: No  . Sexual activity: Not on file   Lifestyle  . Physical activity    Days per week: Not on file    Minutes per session: Not on file  . Stress: Not on file  Relationships  . Social Herbalist on phone: Not on file    Gets together: Not on file    Attends religious service: Not on file    Active member of club or organization: Not on file    Attends meetings of clubs or organizations: Not on file    Relationship status: Not on file  . Intimate partner violence    Fear of current or ex partner: Not on file    Emotionally abused: Not on file    Physically abused: Not on file    Forced sexual activity: Not on file  Other Topics Concern  . Not on file  Social History Narrative   Re-married, no children, Joycelyn Schmid , present wife has 3 daughters      retired-- worked for the [employer] History  Problem Relation Age of Onset  . Hypertension Mother   . Diabetes Mother   . Heart disease Mother        CABG  13 y/o  . Esophageal cancer Father   . Heart disease Maternal Grandmother        MI  . Leukemia Maternal Grandfather   . Cancer Other        all paternal uncles-types unknown  . Prostate cancer Neg Hx   . Colon cancer Neg Hx   . Rectal cancer Neg Hx   . Stomach cancer Neg Hx      Allergies as of 08/25/2018      Reactions   Cephalexin Hives      Medication List       Accurate as of August 25, 2018 11:59 PM. If you have any questions, ask your nurse or doctor.        STOP taking these medications   senna 8.6 MG tablet Commonly known as: SENOKOT Stopped by: Kathlene November, MD     TAKE these medications   amLODipine 5 MG tablet Commonly known as: NORVASC Take 1 tablet (5 mg total) by mouth daily.   aspirin 81 MG tablet Take 81 mg by mouth daily.   atorvastatin 10 MG tablet Commonly known as: LIPITOR Take 1 tablet (10 mg total) by mouth daily.   Co-Enzyme Q10 200 MG Caps Take 1 capsule by mouth daily.   Enulose 10 GM/15ML Soln Generic drug: lactulose (encephalopathy) TAKE 30 MLS  BY MOUTH DAILY  AS NEEDED.   hydrochlorothiazide 12.5 MG tablet Commonly known as: HYDRODIURIL Take 1 tablet (12.5 mg total) by mouth daily.   metoprolol succinate 25 MG 24 hr tablet Commonly known as: TOPROL-XL Take 1 tablet (25 mg total) by mouth daily.   multivitamin tablet Take 1 tablet by mouth daily.   omeprazole 20 MG capsule Commonly known as: PRILOSEC TAKE 1 CAPSULE BY MOUTH  DAILY   polyethylene glycol 17 g packet Commonly known as: MIRALAX / GLYCOLAX Take 17 g by mouth as needed. Reported on 06/15/2015   PROBIOTIC-10 PO Take 1 capsule by mouth daily.           Objective:   Physical Exam BP (!) 143/61 (BP Location: Left Arm, Patient Position: Sitting, Cuff Size: Small)   Pulse (!) 58   Temp 98.2 F (36.8 C) (Oral)   Resp 18   Ht 5\' 4"  (1.626 m)   Wt 154 lb 6 oz (70 kg)   SpO2 99%   BMI 26.50 kg/m  General: Well developed, NAD, BMI noted Neck: No  thyromegaly  HEENT:  Normocephalic . Face symmetric, atraumatic Lungs:  CTA B Normal respiratory effort, no intercostal retractions, no accessory muscle use. Heart: RRR,  no murmur.  No pretibial edema bilaterally  Abdomen:  Not distended, soft, non-tender. No rebound or rigidity.   Skin: Exposed areas without rash. Not pale. Not jaundice Neurologic:  alert & oriented X3.  Speech normal, gait appropriate for age and unassisted Strength symmetric and appropriate for age.  Psych: Cognition and judgment appear intact.  Cooperative with normal attention span and concentration.  Behavior appropriate. No anxious or depressed appearing.     Assessment     Assessment HTN Hyperlipidemia Leg cramps-- lipitor d/c in the past, cramps decreased, back on lipitor as cramps are  mild and benefits > risks. CoQ10 helps, CKs normal. rx robaxin 03-2015 Prostate cancer-- prostatectomy 05-2007, urology stopped f/u,  PSA checked by PCP SKIN: SCC MElanoma, R leg , excision~ July 2020 [facility] GI: GERD,  --Dysphagia--  Rx  EGD 02-2014 (declined,sx chronic, mild) --H. pylori positive, re-checked breathing test negative 06-2015 --Chronic mild  constipation, fecal impaction 2012 (admited per pt) and 01-2015 (ER): on MiraLAX qd, senokot or lactulose prn HOH   CP- saw cards, (-) stress test (EET) 10-2015   PLAN: HTN: Continue amlodipine, HCTZ, metoprolol.  Check a BMP.  Ambulatory BPs 120/70. Hyperlipidemia: Controlled on Lipitor History of melanoma: Had wide excision July 2020 History of prostate cancer: Essentially asymptomatic, occasional urinary leak with exertion, check a PSA. RTC 6 months

## 2018-08-25 NOTE — Progress Notes (Signed)
Pre visit review using our clinic review tool, if applicable. No additional management support is needed unless otherwise documented below in the visit note. 

## 2018-08-25 NOTE — Patient Instructions (Signed)
GO TO THE LAB : Get the blood work     GO TO THE FRONT DESK Schedule your next appointment for checkup in 6 months    Continue checking your blood pressure 2 or 3 times a month   BP GOAL is between 110/65 and  135/85. If it is consistently higher or lower, let me know

## 2018-08-26 ENCOUNTER — Encounter: Payer: Self-pay | Admitting: Internal Medicine

## 2018-08-26 NOTE — Assessment & Plan Note (Signed)
HTN: Continue amlodipine, HCTZ, metoprolol.  Check a BMP.  Ambulatory BPs 120/70. Hyperlipidemia: Controlled on Lipitor History of melanoma: Had wide excision July 2020 History of prostate cancer: Essentially asymptomatic, occasional urinary leak with exertion, check a PSA. RTC 6 months

## 2018-09-22 ENCOUNTER — Other Ambulatory Visit: Payer: Self-pay | Admitting: Internal Medicine

## 2018-10-01 ENCOUNTER — Other Ambulatory Visit: Payer: Self-pay | Admitting: Internal Medicine

## 2018-12-09 MED FILL — FLUOROURACIL 5 % CREA: 5 | 30 days supply | Qty: 40 | Fill #0

## 2019-01-30 ENCOUNTER — Encounter: Payer: Self-pay | Admitting: Internal Medicine

## 2019-02-09 ENCOUNTER — Ambulatory Visit: Payer: Medicare Other | Attending: Internal Medicine

## 2019-02-09 ENCOUNTER — Ambulatory Visit: Payer: Medicare Other

## 2019-02-09 DIAGNOSIS — Z23 Encounter for immunization: Secondary | ICD-10-CM | POA: Insufficient documentation

## 2019-02-09 NOTE — Progress Notes (Signed)
   Covid-19 Vaccination Clinic  Name:  Michael Johns    MRN: [id number] DOB: May 21, 1932  02/09/2019  [name] was observed post Covid-19 immunization for 15 minutes without incidence. He was provided with Vaccine Information Sheet and instruction to access the V-Safe system.   [name] was instructed to call 911 with any severe reactions post vaccine: Marland Kitchen Difficulty breathing  . Swelling of your face and throat  . A fast heartbeat  . A bad rash all over your body  . Dizziness and weakness    Immunizations Administered    Name Date Dose VIS Date Route   Pfizer COVID-19 Vaccine 02/09/2019 10:47 AM 0.3 mL 01/07/2019 Intramuscular   Manufacturer: [place]   Lot: [id number]   [place]: [id number]

## 2019-02-25 ENCOUNTER — Other Ambulatory Visit: Payer: Self-pay

## 2019-02-28 ENCOUNTER — Other Ambulatory Visit: Payer: Self-pay

## 2019-02-28 ENCOUNTER — Encounter: Payer: Self-pay | Admitting: Internal Medicine

## 2019-02-28 ENCOUNTER — Ambulatory Visit (INDEPENDENT_AMBULATORY_CARE_PROVIDER_SITE_OTHER): Payer: Medicare Other | Admitting: Internal Medicine

## 2019-02-28 VITALS — BP 137/58 | HR 58 | Temp 95.4°F | Resp 16 | Ht 64.0 in | Wt 153.0 lb

## 2019-02-28 DIAGNOSIS — I10 Essential (primary) hypertension: Secondary | ICD-10-CM

## 2019-02-28 DIAGNOSIS — K219 Gastro-esophageal reflux disease without esophagitis: Secondary | ICD-10-CM

## 2019-02-28 DIAGNOSIS — E785 Hyperlipidemia, unspecified: Secondary | ICD-10-CM

## 2019-02-28 LAB — COMPREHENSIVE METABOLIC PANEL
ALT: 16 U/L (ref 0–53)
AST: 22 U/L (ref 0–37)
Albumin: 4.2 g/dL (ref 3.5–5.2)
Alkaline Phosphatase: 73 U/L (ref 39–117)
BUN: 15 mg/dL (ref 6–23)
CO2: 30 mEq/L (ref 19–32)
Calcium: 9.7 mg/dL (ref 8.4–10.5)
Chloride: 93 mEq/L — ABNORMAL LOW (ref 96–112)
Creatinine, Ser: 1.12 mg/dL (ref 0.40–1.50)
GFR: 62.33 mL/min (ref >60.00)
Glucose, Bld: 92 mg/dL (ref 70–99)
Potassium: 4.6 mEq/L (ref 3.5–5.1)
Sodium: 131 mEq/L — ABNORMAL LOW (ref 135–145)
Total Bilirubin: 0.7 mg/dL (ref 0.2–1.2)
Total Protein: 7.2 g/dL (ref 6.0–8.3)

## 2019-02-28 LAB — LIPID PANEL
Cholesterol: 171 mg/dL (ref 0–200)
HDL: 50.2 mg/dL (ref >39.00)
LDL Cholesterol: 88 mg/dL (ref 0–99)
NonHDL: 120.6
Total CHOL/HDL Ratio: 3
Triglycerides: 163 mg/dL — ABNORMAL HIGH (ref 0.0–149.0)
VLDL: 32.6 mg/dL (ref 0.0–40.0)

## 2019-02-28 NOTE — Patient Instructions (Addendum)
Please schedule Medicare Wellness with Glenard Haring.   GO TO THE LAB : Get the blood work     [place] Please come back for a physical exam in 6 months, make an appointment    Check the  blood pressure monthly BP GOAL is between 110/65 and  135/85. If it is consistently higher or lower, let me know

## 2019-02-28 NOTE — Assessment & Plan Note (Signed)
HTN: Continue amlodipine, HCTZ and metoprolol.  Check a CMP.  Recommend ambulatory BPs.  Goals provided. Hyperlipidemia, on Lipitor, check FLP. Skin cancer: Sees dermatology regularly, dealing with SCC at the lip. GERD: Controlled on PPIs Preventive care: To get his second Covid vaccine tomorrow. RTC 6 months CPX

## 2019-02-28 NOTE — Progress Notes (Signed)
Pre visit review using our clinic review tool, if applicable. No additional management support is needed unless otherwise documented below in the visit note. 

## 2019-02-28 NOTE — Progress Notes (Signed)
Subjective:    Patient ID: Michael Johns, male    DOB: 10-08-1932, 86 y.o.   MRN: [id number]  DOS:  02/28/2019 Type of visit - description: Routine follow-up Since the last office visit he is doing well. We went over his previous labs reviewed his medications Sees dermatology regularly   Review of Systems He remains active plays golf twice a week. No chest pain, DOE.  No edema or palpitations No nausea, vomiting, diarrhea No GERD symptoms, on PPIs  Past Medical History:  Diagnosis Date  . Abdominal pain 11/09   due to constipation and ?pancreatitis- admitted  . ADENOCARCINOMA, PROSTATE 07/01/2006  . Atypical chest pain 04/01/2015  . Constipation    chronic, impaction ~2012 (admited) and 01-2015  . GERD (gastroesophageal reflux disease)   . Gout    see 09-2008 note  . Hearing decreased    right, 40% x years  . [place], MILD 07/18/2009  . HYPERTENSION 05/19/2006  . Melanoma [facility])     Past Surgical History:  Procedure Laterality Date  . APPENDECTOMY    . cataracts Bilateral 2014   lens implantinfo scanned (02-09-17)  . INGUINAL HERNIA REPAIR Right 11/2007  . MELANOMA EXCISION Right 05/12/2018   R leg melanoma. Dr  Winifred Olive at the [facility] in [place].  Marland Kitchen POLYPECTOMY    . PROSTATECTOMY  05/2007   LADS (-) Bone scan (-)  . Rt middle ear surgery    . sinus polyp removed    . TONSILLECTOMY          Objective:   Physical Exam BP (!) 137/58 (BP Location: Left Arm, Patient Position: Sitting, Cuff Size: Small)   Pulse (!) 58   Temp (!) 95.4 F (35.2 C) (Temporal)   Resp 16   Ht 5\' 4"  (1.626 m)   Wt 153 lb (69.4 kg)   SpO2 100%   BMI 26.26 kg/m   General:   Well developed, NAD, BMI noted. HEENT:  Normocephalic . Face symmetric, atraumatic Lungs:  CTA B Normal respiratory effort, no intercostal retractions, no accessory muscle use. Heart: RRR,  no murmur.  No pretibial edema bilaterally  Skin: Not pale. Not jaundice Neurologic:    alert & oriented X3.  Speech normal, gait appropriate for age and unassisted Psych--  Cognition and judgment appear intact.  Cooperative with normal attention span and concentration.  Behavior appropriate. No anxious or depressed appearing.      Assessment     Assessment HTN Hyperlipidemia Leg cramps-- lipitor d/c in the past, cramps decreased, back on lipitor as cramps are  mild and benefits > risks. CoQ10 helps, CKs normal. rx robaxin 03-2015 Prostate cancer-- prostatectomy 05-2007, urology stopped f/u,  PSA checked by PCP SKIN: SCC MElanoma, R leg , excision~ July 2020 DJD GI: GERD,  --Dysphagia-- Rx  EGD 02-2014 (declined,sx chronic, mild) --H. pylori positive, re-checked breathing test negative 06-2015 --Chronic mild constipation, fecal impaction 2012 (admited per pt) and 01-2015 (ER): on MiraLAX qd, senokot or lactulose prn HOH   CP- saw cards, (-) stress test (EET) 10-2015   PLAN: HTN: Continue amlodipine, HCTZ and metoprolol.  Check a CMP.  Recommend ambulatory BPs.  Goals provided. Hyperlipidemia, on Lipitor, check FLP. Skin cancer: Sees dermatology regularly, dealing with SCC at the lip. GERD: Controlled on PPIs Preventive care: To get his second Covid vaccine tomorrow. RTC 6 months CPX  This visit occurred during the SARS-CoV-2 public health emergency.  Safety protocols were in place, including screening questions prior  to the visit, additional usage of staff PPE, and extensive cleaning of exam room while observing appropriate contact time as indicated for disinfecting solutions.

## 2019-03-01 ENCOUNTER — Ambulatory Visit: Payer: Medicare Other | Attending: Internal Medicine

## 2019-03-01 DIAGNOSIS — Z23 Encounter for immunization: Secondary | ICD-10-CM

## 2019-03-01 NOTE — Progress Notes (Signed)
   Covid-19 Vaccination Clinic  Name:  Michael Johns    MRN: [id number] DOB: Mar 14, 1932  03/01/2019  [name] was observed post Covid-19 immunization for 15 minutes without incidence. He was provided with Vaccine Information Sheet and instruction to access the V-Safe system.   [name] was instructed to call 911 with any severe reactions post vaccine: Marland Kitchen Difficulty breathing  . Swelling of your face and throat  . A fast heartbeat  . A bad rash all over your body  . Dizziness and weakness    Immunizations Administered    Name Date Dose VIS Date Route   Pfizer COVID-19 Vaccine 03/01/2019  9:12 AM 0.3 mL 01/07/2019 Intramuscular   Manufacturer: [place]   Lot: [id number]   [place]: [id number]

## 2019-03-15 ENCOUNTER — Other Ambulatory Visit: Payer: Self-pay | Admitting: Internal Medicine

## 2019-04-10 ENCOUNTER — Other Ambulatory Visit: Payer: Self-pay | Admitting: Internal Medicine

## 2019-05-13 MED FILL — VALACYCLOVIR HCL 500 MG TAB: 500 | 7 days supply | Qty: 7 | Fill #0

## 2019-05-31 ENCOUNTER — Encounter: Payer: Self-pay | Admitting: Internal Medicine

## 2019-06-05 ENCOUNTER — Other Ambulatory Visit: Payer: Self-pay | Admitting: Internal Medicine

## 2019-06-14 ENCOUNTER — Telehealth: Payer: Self-pay | Admitting: Internal Medicine

## 2019-06-14 NOTE — Progress Notes (Signed)
°  Chronic Care Management   Outreach Note  06/14/2019 Name: Michael Johns MRN: [id number] DOB: [birth year]  Referred by: Colon Branch, MD Reason for referral : No chief complaint on file.   An unsuccessful telephone outreach was attempted today. The patient was referred to the pharmacist for assistance with care management and care coordination.   This note is not being shared with the patient for the following reason: To respect privacy (The patient or proxy has requested that the information not be shared).  Follow Up Plan:   Earney [place] Upstream Scheduler

## 2019-07-05 ENCOUNTER — Telehealth: Payer: Self-pay | Admitting: Internal Medicine

## 2019-07-05 NOTE — Progress Notes (Signed)
°  Chronic Care Management   Outreach Note  07/05/2019 Name: LEMARIO CHAIKIN MRN: [id number] DOB: [birth year]  Referred by: Colon Branch, MD Reason for referral : No chief complaint on file.   An unsuccessful telephone outreach was attempted today. The patient was referred to the pharmacist for assistance with care management and care coordination.   This note is not being shared with the patient for the following reason: To respect privacy (The patient or proxy has requested that the information not be shared).  Follow Up Plan:   Earney Hamburg Upstream Scheduler

## 2019-07-27 ENCOUNTER — Telehealth: Payer: Self-pay | Admitting: Internal Medicine

## 2019-07-27 NOTE — Progress Notes (Signed)
  Chronic Care Management   Outreach Note  07/27/2019 Name: Michael Johns MRN: [id number] DOB: 1933-01-12  Referred by: Colon Branch, MD Reason for referral : No chief complaint on file.   Third unsuccessful telephone outreach was attempted today. The patient was referred to the pharmacist for assistance with care management and care coordination.   Follow Up Plan:  [place]

## 2019-07-29 ENCOUNTER — Telehealth: Payer: Self-pay | Admitting: Internal Medicine

## 2019-07-29 NOTE — Progress Notes (Signed)
  Chronic Care Management   Note  07/29/2019 Name: Michael Johns MRN: [id number] DOB: [birth year]  Michael Johns is a 86 y.o. year old male who is a primary care patient of Paz, Alda Berthold, MD. I reached out to [employer] by phone today in response to a referral sent by Mr. [facility] PCP, Colon Branch, MD.   Mr. Spradley was given information about Chronic Care Management services today including:  1. CCM service includes personalized support from designated clinical staff supervised by his physician, including individualized plan of care and coordination with other care providers 2. 24/7 contact phone numbers for assistance for urgent and routine care needs. 3. Service will only be billed when office clinical staff spend 20 minutes or more in a month to coordinate care. 4. Only one practitioner may furnish and bill the service in a calendar month. 5. The patient may stop CCM services at any time (effective at the end of the month) by phone call to the office staff.   Patient agreed to services and verbal consent obtained.   Follow up plan:   .[place]

## 2019-08-07 ENCOUNTER — Other Ambulatory Visit: Payer: Self-pay | Admitting: Internal Medicine

## 2019-08-10 MED FILL — FLUOROURACIL 5 % CREA: 5 | 30 days supply | Qty: 40 | Fill #0

## 2019-08-29 ENCOUNTER — Other Ambulatory Visit: Payer: Self-pay

## 2019-08-29 ENCOUNTER — Encounter: Payer: Self-pay | Admitting: Internal Medicine

## 2019-08-29 ENCOUNTER — Ambulatory Visit (INDEPENDENT_AMBULATORY_CARE_PROVIDER_SITE_OTHER): Payer: Medicare Other | Admitting: Internal Medicine

## 2019-08-29 VITALS — BP 132/77 | HR 60 | Temp 97.8°F | Resp 16 | Ht 64.0 in | Wt 152.1 lb

## 2019-08-29 DIAGNOSIS — Z8546 Personal history of malignant neoplasm of prostate: Secondary | ICD-10-CM | POA: Diagnosis not present

## 2019-08-29 DIAGNOSIS — I10 Essential (primary) hypertension: Secondary | ICD-10-CM

## 2019-08-29 DIAGNOSIS — Z0000 Encounter for general adult medical examination without abnormal findings: Secondary | ICD-10-CM

## 2019-08-29 DIAGNOSIS — R7989 Other specified abnormal findings of blood chemistry: Secondary | ICD-10-CM | POA: Diagnosis not present

## 2019-08-29 LAB — CBC WITH DIFFERENTIAL/PLATELET
Basophils Absolute: 0 10*3/uL (ref 0.0–0.1)
Basophils Relative: 0.5 % (ref 0.0–3.0)
Eosinophils Absolute: 0.3 10*3/uL (ref 0.0–0.7)
Eosinophils Relative: 3.3 % (ref 0.0–5.0)
HCT: 39.9 % (ref 39.0–52.0)
Hemoglobin: 13.8 g/dL (ref 13.0–17.0)
Lymphocytes Relative: 26.3 % (ref 12.0–46.0)
Lymphs Abs: 2.3 10*3/uL (ref 0.7–4.0)
MCHC: 34.6 g/dL (ref 30.0–36.0)
MCV: 90.7 fl (ref 78.0–100.0)
Monocytes Absolute: 1.1 10*3/uL — ABNORMAL HIGH (ref 0.1–1.0)
Monocytes Relative: 12.2 % — ABNORMAL HIGH (ref 3.0–12.0)
Neutro Abs: 5.1 10*3/uL (ref 1.4–7.7)
Neutrophils Relative %: 57.7 % (ref 43.0–77.0)
Platelets: 259 10*3/uL (ref 150.0–400.0)
RBC: 4.4 Mil/uL (ref 4.22–5.81)
RDW: 14.2 % (ref 11.5–15.5)
WBC: 8.9 10*3/uL (ref 4.0–10.5)

## 2019-08-29 LAB — TSH: TSH: 3.71 u[IU]/mL (ref 0.35–4.50)

## 2019-08-29 LAB — BASIC METABOLIC PANEL
BUN: 16 mg/dL (ref 6–23)
CO2: 29 mEq/L (ref 19–32)
Calcium: 9.5 mg/dL (ref 8.4–10.5)
Chloride: 95 mEq/L — ABNORMAL LOW (ref 96–112)
Creatinine, Ser: 1.11 mg/dL (ref 0.40–1.50)
GFR: 62.91 mL/min (ref >60.00)
Glucose, Bld: 92 mg/dL (ref 70–99)
Potassium: 4.7 mEq/L (ref 3.5–5.1)
Sodium: 131 mEq/L — ABNORMAL LOW (ref 135–145)

## 2019-08-29 LAB — PSA: PSA: 0 ng/mL — ABNORMAL LOW (ref 0.10–4.00)

## 2019-08-29 NOTE — Progress Notes (Signed)
Pre visit review using our clinic review tool, if applicable. No additional management support is needed unless otherwise documented below in the visit note. 

## 2019-08-29 NOTE — Patient Instructions (Addendum)
Please schedule Medicare Wellness with Glenard Haring.   Okay to stop aspirin   Check the  blood pressure regularly BP GOAL is between 110/65 and  135/85. If it is consistently higher or lower, let me know   GO TO THE LAB : Get the blood work     [place], [place] back for a follow-up in 6 months

## 2019-08-29 NOTE — Progress Notes (Signed)
Subjective:    Patient ID: Michael Johns, male    DOB: [birth year], 86 y.o.   MRN: [id number]  DOS:  08/29/2019 Type of visit - description: Here for CPX Has no major concerns Plays golf regularly    Review of Systems Denies any dysuria, gross hematuria.  Occasionally mild urinary incontinence with exertion.  Other than above, a 14 point review of systems is negative     Past Medical History:  Diagnosis Date  . Abdominal pain 11/09   due to constipation and ?pancreatitis- admitted  . ADENOCARCINOMA, PROSTATE 07/01/2006  . Atypical chest pain 04/01/2015  . Constipation    chronic, impaction ~2012 (admited) and 01-2015  . GERD (gastroesophageal reflux disease)   . Gout    see 09-2008 note  . Hearing decreased    right, 40% x years  . [place], MILD 07/18/2009  . HYPERTENSION 05/19/2006  . Melanoma [facility])     Past Surgical History:  Procedure Laterality Date  . APPENDECTOMY    . cataracts Bilateral 2014   lens implantinfo scanned (02-09-17)  . INGUINAL HERNIA REPAIR Right 11/2007  . MELANOMA EXCISION Right 05/12/2018   R leg melanoma. Dr  Winifred Olive at the [facility] in [place].  Marland Kitchen POLYPECTOMY    . PROSTATECTOMY  05/2007   LADS (-) Bone scan (-)  . Rt middle ear surgery    . sinus polyp removed    . TONSILLECTOMY      Allergies as of 08/29/2019      Reactions   Cephalexin Hives      Medication List       Accurate as of August 29, 2019 11:59 PM. If you have any questions, ask your nurse or doctor.        STOP taking these medications   aspirin 81 MG tablet Stopped by: Kathlene November, MD     TAKE these medications   amLODipine 5 MG tablet Commonly known as: NORVASC Take 1 tablet (5 mg total) by mouth daily.   atorvastatin 10 MG tablet Commonly known as: LIPITOR Take 1 tablet (10 mg total) by mouth daily.   Co-Enzyme Q10 200 MG Caps Take 1 capsule by mouth daily.   hydrochlorothiazide 12.5 MG tablet Commonly known as:  HYDRODIURIL Take 1 tablet (12.5 mg total) by mouth daily.   lactulose (encephalopathy) 10 GM/15ML Soln Commonly known as: Enulose Take 45 mLs (30 g total) by mouth daily as needed.   metoprolol succinate 25 MG 24 hr tablet Commonly known as: TOPROL-XL Take 1 tablet (25 mg total) by mouth daily.   multivitamin tablet Take 1 tablet by mouth daily.   omeprazole 20 MG capsule Commonly known as: PRILOSEC Take 1 capsule (20 mg total) by mouth daily.   polyethylene glycol 17 g packet Commonly known as: MIRALAX / GLYCOLAX Take 17 g by mouth as needed. Reported on 06/15/2015   PROBIOTIC-10 PO Take 1 capsule by mouth daily.          Objective:   Physical Exam BP (!) 132/77 (BP Location: Left Arm, Patient Position: Sitting, Cuff Size: Small)   Pulse 60   Temp 97.8 F (36.6 C) (Oral)   Resp 16   Ht 5\' 4"  (1.626 m)   Wt 152 lb 2 oz (69 kg)   SpO2 100%   BMI 26.11 kg/m  General: Well developed, NAD, BMI noted Neck: No  thyromegaly  HEENT:  Normocephalic . Face symmetric, atraumatic Lungs:  CTA B Normal respiratory effort, no  intercostal retractions, no accessory muscle use. Heart: RRR,  no murmur.  Abdomen:  Not distended, soft, non-tender. No rebound or rigidity.   Lower extremities: no pretibial edema bilaterally  Skin: Exposed areas without rash. Not pale. Not jaundice Neurologic:  alert & oriented X3.  Speech normal, gait appropriate for age and unassisted Strength symmetric and appropriate for age.  Psych: Cognition and judgment appear intact.  Cooperative with normal attention span and concentration.  Behavior appropriate. No anxious or depressed appearing.     Assessment     Assessment HTN Hyperlipidemia Leg cramps-- lipitor d/c in the past, cramps decreased, back on lipitor as cramps are  mild and benefits > risks. CoQ10 helps, CKs normal. rx robaxin 03-2015 Prostate cancer-- prostatectomy 05-2007, urology stopped f/u,  PSA checked by  PCP SKIN: SCC MElanoma, R leg , excision~ July 2020 DJD GI: GERD,  --Dysphagia-- Rx  EGD 02-2014 (declined,sx chronic, mild) --H. pylori positive, re-checked breathing test negative 06-2015 --Chronic mild constipation, fecal impaction 2012 (admited per pt) and 01-2015 (ER): on MiraLAX qd, senokot or lactulose prn HOH   CP- saw cards, (-) stress test (EET) 10-2015   PLAN: Here for CPX HTN: BP well controlled, at home 120/70, continue amlodipine, HCTZ, metoprolol.  Check a BMP and CBC High cholesterol: On Lipitor, last FLP satisfactory Skin cancer: Sees dermatology regularly GERD: Well-controlled with low-dose omeprazole. Aspirin: Recommend to discontinue. RTC 6 months      This visit occurred during the SARS-CoV-2 public health emergency.  Safety protocols were in place, including screening questions prior to the visit, additional usage of staff PPE, and extensive cleaning of exam room while observing appropriate contact time as indicated for disinfecting solutions.

## 2019-08-30 ENCOUNTER — Encounter: Payer: Self-pay | Admitting: Internal Medicine

## 2019-08-30 NOTE — Assessment & Plan Note (Signed)
--  Td 2013 - pneumonia shot 2009, 2019 - prevnar 2015 -zostavax 2-09 - s/p  shingrex x 2  -Had Covid vaccinations -Flu shot rec q. year --Cscope 10-08, Adenomatous polyp, repeated cscope again 03-2011 Normal, was rec no f/u --History of prostate cancer, checking a PSA.    --Diet, exercise: Discussed -Labs: BMP, CBC, TSH, PSA

## 2019-08-30 NOTE — Assessment & Plan Note (Signed)
Here for CPX HTN: BP well controlled, at home 120/70, continue amlodipine, HCTZ, metoprolol.  Check a BMP and CBC High cholesterol: On Lipitor, last FLP satisfactory Skin cancer: Sees dermatology regularly GERD: Well-controlled with low-dose omeprazole. Aspirin: Recommend to discontinue. RTC 6 months

## 2019-09-19 ENCOUNTER — Ambulatory Visit: Payer: Medicare Other | Admitting: Pharmacist

## 2019-09-19 ENCOUNTER — Other Ambulatory Visit: Payer: Self-pay

## 2019-09-19 ENCOUNTER — Telehealth: Payer: Self-pay | Admitting: Pharmacist

## 2019-09-19 VITALS — BP 135/57 | HR 61 | Wt 148.4 lb

## 2019-09-19 DIAGNOSIS — K219 Gastro-esophageal reflux disease without esophagitis: Secondary | ICD-10-CM

## 2019-09-19 DIAGNOSIS — I10 Essential (primary) hypertension: Secondary | ICD-10-CM

## 2019-09-19 DIAGNOSIS — E785 Hyperlipidemia, unspecified: Secondary | ICD-10-CM

## 2019-09-19 NOTE — Progress Notes (Signed)
    Chronic Care Management Pharmacy Assistant   Name: AMMAAR ENCINA  MRN: [id number] DOB: 03-01-1932  Reason for Encounter: Initial Questions prior to Initial Visit   PCP : Colon Branch, MD  Allergies:   Allergies  Allergen Reactions  . Cephalexin Hives    Medications: Outpatient Encounter Medications as of 09/19/2019  Medication Sig  . amLODipine (NORVASC) 5 MG tablet Take 1 tablet (5 mg total) by mouth daily.  Marland Kitchen atorvastatin (LIPITOR) 10 MG tablet Take 1 tablet (10 mg total) by mouth daily.  Marland Kitchen Co-Enzyme Q10 200 MG CAPS Take 1 capsule by mouth daily.  . hydrochlorothiazide (HYDRODIURIL) 12.5 MG tablet Take 1 tablet (12.5 mg total) by mouth daily.  Marland Kitchen lactulose, encephalopathy, (ENULOSE) 10 GM/15ML SOLN Take 45 mLs (30 g total) by mouth daily as needed.  . metoprolol succinate (TOPROL-XL) 25 MG 24 hr tablet Take 1 tablet (25 mg total) by mouth daily.  . Multiple Vitamin (MULTIVITAMIN) tablet Take 1 tablet by mouth daily.    Marland Kitchen omeprazole (PRILOSEC) 20 MG capsule Take 1 capsule (20 mg total) by mouth daily.  . polyethylene glycol (MIRALAX / GLYCOLAX) packet Take 17 g by mouth as needed. Reported on 06/15/2015  . Probiotic Product (PROBIOTIC-10 PO) Take 1 capsule by mouth daily.   No facility-administered encounter medications on file as of 09/19/2019.    Current Diagnosis: Patient Active Problem List   Diagnosis Date Noted  . GERD (gastroesophageal reflux disease) 02/28/2019  . Melanoma of skin ([place]) 04/14/2018  . Atypical chest pain 04/01/2015  . PCP NOTES >>>>>>>>>>>>>>>>>>>> 11/09/2014  . DJD (degenerative joint disease) 05/06/2013  . Dysphagia 04/23/2012  . Annual physical exam 04/16/2011  . Chronic constipation 07/22/2010  . Hyperlipidemia 07/18/2009  . ADENOCARCINOMA, PROSTATE 07/01/2006  . PROSTATECTOMY, HX OF 07/01/2006  . Essential hypertension 05/19/2006    Goals Addressed   None     Chronic Care Management   Outreach Note  09/27/2019 Name: EURA RADABAUGH MRN: [id number] DOB: [birth year]  Referred by: Colon Branch, MD Reason for referral : Chronic Care Management   An unsuccessful telephone outreach was attempted today. The patient was referred to the pharmacist for assistance with care management and care coordination.   Follow Up Plan:    Fanny Skates, [place] Pharmacist Assistant [phone]

## 2019-09-19 NOTE — Chronic Care Management (AMB) (Signed)
Chronic Care Management Pharmacy  Name: Michael Johns  MRN: [id number] DOB: 11/25/1932  Chief Complaint/ HPI  Michael Johns,  86 y.o. , male presents for their Initial CCM visit with the clinical pharmacist In office.  PCP : Michael Branch, MD  Their chronic conditions include: Hypertension, Hyperlipidemia, GERD, Chronic Constipation  Office Visits: 08/29/19: Visit w/ Dr. Larose Kells - Recommended D/C aspirin.   Consult Visit: None in the last 6 months  Medications: Outpatient Encounter Medications as of 09/19/2019  Medication Sig  . amLODipine (NORVASC) 5 MG tablet Take 1 tablet (5 mg total) by mouth daily.  Marland Kitchen atorvastatin (LIPITOR) 10 MG tablet Take 1 tablet (10 mg total) by mouth daily.  Marland Kitchen Co-Enzyme Q10 200 MG CAPS Take 1 capsule by mouth daily.  . hydrochlorothiazide (HYDRODIURIL) 12.5 MG tablet Take 1 tablet (12.5 mg total) by mouth daily.  Marland Kitchen lactulose, encephalopathy, (ENULOSE) 10 GM/15ML SOLN Take 45 mLs (30 g total) by mouth daily as needed.  . metoprolol succinate (TOPROL-XL) 25 MG 24 hr tablet Take 1 tablet (25 mg total) by mouth daily.  . Multiple Vitamin (MULTIVITAMIN) tablet Take 1 tablet by mouth daily.    Marland Kitchen omeprazole (PRILOSEC) 20 MG capsule Take 1 capsule (20 mg total) by mouth daily.  . polyethylene glycol (MIRALAX / GLYCOLAX) packet Take 17 g by mouth as needed. Reported on 06/15/2015  . Probiotic Product (PROBIOTIC-10 PO) Take 1 capsule by mouth daily.   No facility-administered encounter medications on file as of 09/19/2019.   SDOH Screenings   Alcohol Screen:   . Last Alcohol Screening Score (AUDIT): Not on file  Depression (PHQ2-9): Low Risk   . PHQ-2 Score: 0  Financial Resource Strain: Low Risk   . Difficulty of Paying Living Expenses: Not very hard  Food Insecurity:   . Worried About Charity fundraiser in the Last Year: Not on file  . Ran Out of Food in the Last Year: Not on file  Housing:   . Last Housing Risk Score: Not on file  Physical Activity:   .  Days of Exercise per Week: Not on file  . Minutes of Exercise per Session: Not on file  Social Connections:   . Frequency of Communication with Friends and Family: Not on file  . Frequency of Social Gatherings with Friends and Family: Not on file  . Attends Religious Services: Not on file  . Active Member of Clubs or Organizations: Not on file  . Attends Archivist Meetings: Not on file  . Marital Status: Not on file  Stress:   . Feeling of Stress : Not on file  Tobacco Use: Low Risk   . Smoking Tobacco Use: Never Smoker  . Smokeless Tobacco Use: Never Used  Transportation Needs:   . Film/video editor (Medical): Not on file  . Lack of Transportation (Non-Medical): Not on file     Current Diagnosis/Assessment:  Goals Addressed            This Visit's Progress   . Chronic Care Management Pharmacy Care Plan       CARE PLAN ENTRY (see longitudinal plan of care for additional care plan information)  Current Barriers:  . Chronic Disease Management support, education, and care coordination needs related to Hypertension, Hyperlipidemia, GERD, Chronic Constipation   Hypertension BP Readings from Last 3 Encounters:  09/19/19 (!) 135/57  08/29/19 (!) 132/77  02/28/19 (!) 137/58   . Pharmacist Clinical Goal(s): o Over the next 90 days,  patient will work with PharmD and providers to maintain BP goal <140/90 . Current regimen:  . Amlodipine 70m daily . HCTZ 12.531mdaily  . Metoprolol succinate 2531maily  . Interventions: o Discussed BP med options . Patient self care activities - Over the next 90 days, patient will: o Check BP 2-3 times per week, document, and provide at future appointments o Ensure daily salt intake < 2300 mg/[name]  Hyperlipidemia Lab Results  Component Value Date/Time   LDLCALC 88 02/28/2019 08:20 AM   LDLDIRECT 150.2 04/16/2011 09:11 AM   . Pharmacist Clinical Goal(s): o Over the next 90 days, patient will work with PharmD and providers  to maintain LDL goal < 100 . Current regimen:  . Atorvastatin 81m50mily  . CoQ10 200mg73mly . Interventions: o Consider D/C CoQ10 if not providing benefit o Collaboration with provider regarding medication management (changing atorvastatin 81mg 9mosuvastatin 5mg to65me if this helps with muscle ache) . Patient self care activities - Over the next 90 days, patient will: o Maintain cholesterol medication regimen.   Medication management . Pharmacist Clinical Goal(s): o Over the next 90 days, patient will work with PharmD and providers to maintain optimal medication adherence . [facility] o Comprehensive medication review performed. o Continue current medication management strategy . Patient self care activities - Over the next 90 days, patient will: o Focus on medication adherence by filling and taking medications appropriately  o Take medications as prescribed o Report any questions or concerns to PharmD and/or provider(s)  Initial goal documentation       Patient brings in a list of specific questions to address during visit. His questions are bolded below. Will provide a copy with printed answers as below noting answers were also discussed during the visit 1. Last cholesterol test, 171, close to high mark. Triglycerides, 163, high  Total cholesterol goal is less than 200. 171 is satisfactory.   Triglyceride goal is less than 150. You can reduce triglycerides my limiting the amount of fried foods and carbohydrates you consume. We will also see if the new cholesterol medication can assist.  2. Potassium and chloride, high. Should I stop multi-vitamins and are they that helpful. Should I just take certain ones?  Most recent potassium and chloride are not elevated, in fact most recent chloride is borderline low, but not of great concern. Multivitamins are not as beneficial for those with a balanced diet. The vitamins your body  has excess of will be removed through the urine. If you have a balanced diet you can consider stopping the multivitamin. Vitamin supplementation is most helpful when deficient. If not deficient there may not be significant benefit with increased cost. Vitamin D [phone] units per Salimatou Simone is beneficial for most adults to help absorb calcium. 3. Are there any meds I can discontinue or combine, my hypertension meds?  You could consider discontinuing Coq10, the probiotic, and your multivitamin.   There are some combinations we can explore for your hypertension meds. I will discuss these options with Dr. Paz. 4.Larose Kellsuld I take lisinopril/hctz instead of the three meds I take for hypertension? (Metoprolol succinate (Toprol) 25mg, H89m12.5mg, Aml25mpine 5mg)?   W21m discuss hypertension med options with Dr. Paz. ThereLarose Kellse pros and cons for each combination to consider.  5. Is Coq10 helpful in preventing heart problems? Is it worth taking?   All CoQ10 products are not created equal and the most benefit may be for  those who have already had a "heart problem" such as a heart attack. Considering the cost and limited benefit for you specifically I would not recommend taking it unless advised by a cardiologist or if it assists with your muscle pain.  6. What about Probiotics? Does Probiotics help with constipation?  Probiotics help to replace good bacteria in our digestive track. There is limited evidence that probiotics could help with constipation and the specific strain of probiotic purchased matters. The best evidence is with the probiotic strains in Culturelle and Florastor.  7. I take a multi-vitamin; Are they worthwhile and if so then, which ones?  My thoughts on multivitamins are listed above. When choosing any supplement. I would recommend one that is "USP Verified". These vitamins are analyzed by a group similar to the FDA, but for supplements.   Hypertension   BP goal is:  <140/90  Office blood  pressures are  BP Readings from Last 3 Encounters:  09/19/19 (!) 135/57  08/29/19 (!) 132/77  02/28/19 (!) 137/58   CMP Latest Ref Rng & Units 08/29/2019 02/28/2019 08/25/2018  Glucose 70 - 99 mg/dL 92 92 100(H)  BUN 6 - 23 mg/dL _0 Creatinine 0.40 - 1.50 mg/dL 1.11 1.12 1.25  Sodium 135 - 145 mEq/L 131(L) 131(L) 134(L)  Potassium 3.5 - 5.1 mEq/L 4.7 4.6 4.4  Chloride 96 - 112 mEq/L 95(L) 93(L) 97  CO2 19 - 32 mEq/L _1 Calcium 8.4 - 10.5 mg/dL 9.5 9.7 9.8  Total Protein 6.0 - 8.3 g/dL - 7.2 -  Total Bilirubin 0.2 - 1.2 mg/dL - 0.7 -  Alkaline Phos 39 - 117 U/L - 73 -  AST 0 - 37 U/L - 22 -  ALT 0 - 53 U/L - 16 -   Kidney Function Lab Results  Component Value Date/Time   CREATININE 1.11 08/29/2019 08:27 AM   CREATININE 1.12 02/28/2019 08:20 AM   GFR 62.91 08/29/2019 08:27 AM   GFRNONAA 77.48 01/17/2009 09:09 AM   GFRAA  12/15/2007 09:00 AM    >60        The eGFR has been calculated using the MDRD equation. This calculation has not been validated in all clinical   K 4.7 08/29/2019 08:27 AM   K 4.6 02/28/2019 08:20 AM    Patient checks BP at home infrequently Patient home BP readings are ranging: Unable to assess  Patient has failed these meds in the past: None noted  Patient is currently controlled on the following medications:  . Amlodipine 13m daily . HCTZ 12.563mdaily  . Metoprolol succinate 2560maily   Fill Dates Per Dispensing Hx: Appropriately filled Amlodipine (07/05/19 90DS, 04/15/19 90DS) HCTZ (07/05/19 90DS, 04/15/19 90DS) Metoprolol (09/01/19 90DS, 06/17/19 90DS, 04/15/19 90DS)  Patient is interested in simplifying his medication regimen. He asks about taking lisinopril noting his wife takes it in a combo pill reducing the number of pill she has to take.  We thoroughly discussed possible medication regimens for patient noting the pros/cons of each regimen.   Med Options Lisinopril/hctz combo to replace amlodipine and hctz Pros: Reduces pill burden,  ACEI risk of hyperkalemia balanced by hctz Cons: Continuing hctz could continue low/boderline low Chloride and Sodium  Benazepril/amlodipine combo to replace amlodipine and hctz Pros: Might reduce risk of hypochloremia and hyponatremia noting pt's values have been low Cons: Risk of hyperkalemia without hctz  Eventual taper off of metoprolol once BP stable (no compelling indication for beta blocker, but note that  patient has taken this for years and may not tolerate taper)  Will discuss options with Dr. Larose Kells  We discussed Med options noting pros and cons, BP goal  Plan -Continue current medications  -Check BP 2-3 times per week and record    Hyperlipidemia   LDL goal < 100  Lipid Panel     Component Value Date/Time   CHOL 171 02/28/2019 0820   TRIG 163.0 (H) 02/28/2019 0820   HDL 50.20 02/28/2019 0820   LDLCALC 88 02/28/2019 0820   LDLDIRECT 150.2 04/16/2011 0911    Hepatic Function Latest Ref Rng & Units 02/28/2019 02/09/2018 02/04/2017  Total Protein 6.0 - 8.3 g/dL 7.2 - 7.8  Albumin 3.5 - 5.2 g/dL 4.2 - 4.4  AST 0 - 37 U/L _0 ALT 0 - 53 U/L _1 Alk Phosphatase 39 - 117 U/L 73 - 60  Total Bilirubin 0.2 - 1.2 mg/dL 0.7 - 0.7     The ASCVD Risk score ([place]., et al., 2013) failed to calculate for the following reasons:   The 2013 ASCVD risk score is only valid for ages 76 to 16   Patient has failed these meds in past: None noted  Patient is currently controlled, except for TRIG on the following medications:  . Atorvastatin 15m daily  . CoQ10 202mdaily  Fill Dates Per Dispensing Hx - Appropriately filled Atorvastatin (07/05/19 90DS, 04/15/19 90DS)  We discussed:  LDL and TRIG goals.    He states he experiences cramping while on atorvastatin 1032mnd wonders what other options are available. Can consider changing to rosuvastatin as it is more hydrophilic and could possibly cause less muscle cramping. He states that the coq10 has not assisted in reducing  cramping. Discussed that CoQ10 may not be beneficial and he could D/C this medication. He requests that if his statin is changed to rosuvastatin that it is sent to [facility].  Plan -Consider discontinuation of CoQ10 -Consider changing atorvastatin 23m96m rosuvastatin 5mg 55mly  GERD   Patient has failed these meds in past: None noted  Patient is currently controlled on the following medications:  . Omeprazole 20mg 68my  Patient attempted to D/C omeprazole in the past. Does not recall if he tapered his dose or not.  Patient interested in tapering off of PPI.  We discussed:  the risk/benefit of continuation vs discontinuation of long term PPI use  We discussed tapering over a 5 week course.  Plan -Consider PPI taper   Chronic Constipation    Patient has failed these meds in past: None noted  Patient is currently controlled on the following medications: . Lactulose 10g/15mL 493m (72m daily . Miralax 17g daily . Probiotic daily  Pt did not notice a difference in constipation since starting the probiotic. Wonders if he can D/C.   We discussed:  that if the probiotic did not change his bowel habits and that was his main reason for taking it then he could D/C  Plan -Consider D/C probiotic   Vaccines   Reviewed and discussed patient's vaccination history.  Pt is up to date on vaccines.  Immunization History  Administered Date(s) Administered  . Influenza Split 10/30/2010, 10/29/2011  . Influenza Whole 11/11/2006, 11/17/2007, 10/25/2008, 10/30/2009  . Influenza, High Dose Seasonal PF 10/18/2015  . Influenza,inj,Quad PF,6+ Mos 10/20/2012, 10/19/2013, 10/11/2014, 09/08/2018  . Influenza-Unspecified 09/17/2016, 10/27/2017  . PFIZER SARS-COV-2 Vaccination 02/09/2019, 03/01/2019  . Pneumococcal Conjugate-13 09/07/2013  . Pneumococcal Polysaccharide-23 05/05/2001, 10/15/2007, 08/06/2017  .  Td 03/29/2001  . Tdap 04/16/2011  . Zoster 03/10/2007  . Zoster Recombinat  (Shingrix) 09/17/2016, 01/26/2017    Miscellaneous Meds Patient interested in decreasing polypharmacy. Would like to get all of his medications filled through [facility] Multivitamin (discussed that he could D/C this if he felt he had a balanced diet) Aspirin (states he stomach felt better when he D/C aspirin)  Follow up: 1 week phone visit

## 2019-09-28 ENCOUNTER — Other Ambulatory Visit: Payer: Self-pay

## 2019-09-28 ENCOUNTER — Ambulatory Visit: Payer: Medicare Other | Admitting: Pharmacist

## 2019-09-28 DIAGNOSIS — E785 Hyperlipidemia, unspecified: Secondary | ICD-10-CM

## 2019-09-28 DIAGNOSIS — K219 Gastro-esophageal reflux disease without esophagitis: Secondary | ICD-10-CM

## 2019-09-28 DIAGNOSIS — I10 Essential (primary) hypertension: Secondary | ICD-10-CM

## 2019-09-28 NOTE — Patient Instructions (Addendum)
Visit Information  Goals Addressed            This Visit's Progress   . Chronic Care Management Pharmacy Care Plan       CARE PLAN ENTRY (see longitudinal plan of care for additional care plan information)  Current Barriers:  . Chronic Disease Management support, education, and care coordination needs related to Hypertension, Hyperlipidemia, GERD, Chronic Constipation   Hypertension BP Readings from Last 3 Encounters:  09/19/19 (!) 135/57  08/29/19 (!) 132/77  02/28/19 (!) 137/58   . Pharmacist Clinical Goal(s): o Over the next 90 days, patient will work with PharmD and providers to maintain BP goal <140/90 . Current regimen:  . Amlodipine 25m daily . HCTZ 12.564mdaily  . Metoprolol succinate 2513maily  . Interventions: o Discussed BP med options . Patient self care activities - Over the next 90 days, patient will: o Check BP 2-3 times per week, document, and provide at future appointments o Ensure daily salt intake < 2300 mg/Kery Haltiwanger  Hyperlipidemia Lab Results  Component Value Date/Time   LDLCALC 88 02/28/2019 08:20 AM   LDLDIRECT 150.2 04/16/2011 09:11 AM   . Pharmacist Clinical Goal(s): o Over the next 90 days, patient will work with PharmD and providers to maintain LDL goal < 100 . Current regimen:  . Atorvastatin 46m36mily  . CoQ10 200mg5mly . Interventions: o Consider D/C CoQ10 if not providing benefit o Collaboration with provider regarding medication management (changing atorvastatin 46mg 70mosuvastatin 5mg to11me if this helps with muscle ache) . Patient self care activities - Over the next 90 days, patient will: o Maintain cholesterol medication regimen.   GERD . Pharmacist Clinical Goal(s) o Over the next 90 days, patient will work with PharmD and providers to reduce symptoms of GERD and polypharmacy . Current regimen:  o Omeprazole 20mg ev41mother Cyniah Gossard . Interventions: o Discussed tapering plan . Patient self care activities - Over the next 90  days, patient will: o Continue tapering plan noting that if he has symptoms he can supplement with famotidine (generic)/Pepcid (name brand)   Chronic Constipation . Pharmacist Clinical Goal(s) o Over the next 90 days, patient will work with PharmD and providers to reduce symptoms of constipation . Current regimen:  . Lactulose 10g/15mL 71m75m3050maily . Miralax 17g daily . Interventions: o We discussed D/C probiotic and initial visit. Pt has tolerated stopping probiotic . Patient self care activities - Over the next 90 days, patient will: o Maintain constipation medication regimen   Medication management . Pharmacist Clinical Goal(s): o Over the next 90 days, patient will work with PharmD and providers to maintain optimal medication adherence . Current [facility] o Comprehensive medication review performed. o Continue current medication management strategy . Patient self care activities - Over the next 90 days, patient will: o Focus on medication adherence by filling and taking medications appropriately  o Take medications as prescribed o Report any questions or concerns to PharmD and/or provider(s)  Please see past updates related to this goal by clicking on the "Past Updates" button in the selected goal         Mr. Roane was Daws information about Chronic Care Management services today including:  1. CCM service includes personalized support from designated clinical staff supervised by his physician, including individualized plan of care and coordination with other care providers 2. 24/7 contact phone numbers for assistance for urgent and routine care needs. 3. Standard insurance, coinsurance,  copays and deductibles apply for chronic care management only during months in which we provide at least 20 minutes of these services. Most insurances cover these services at 100%, however patients may be responsible for any copay, coinsurance  and/or deductible if applicable. This service may help you avoid the need for more expensive face-to-face services. 4. Only one practitioner may furnish and bill the service in a calendar month. 5. The patient may stop CCM services at any time (effective at the end of the month) by phone call to the office staff.  Patient agreed to services and verbal consent obtained.   The patient verbalized understanding of instructions provided today and agreed to receive a mailed copy of patient instruction and/or educational materials. Telephone follow up appointment with pharmacy team member scheduled for: 11/02/2019  Melvenia Beam Lupe Bonner, PharmD Clinical Pharmacist [facility] [phone]  Answers to your questions presented at the office visit on 09/19/19:  1. Last cholesterol test, 171, close to high mark. Triglycerides, 163, high  Total cholesterol goal is less than 200. 171 is satisfactory.   Triglyceride goal is less than 150. You can reduce triglycerides my limiting the amount of fried foods and carbohydrates you consume. We will also see if the new cholesterol medication can assist.  2. Potassium and chloride, high. Should I stop multi-vitamins and are they that helpful. Should I just take certain ones? ? Most recent potassium and chloride are not elevated, in fact most recent chloride is borderline low, but not of great concern. Multivitamins are not as beneficial for those with a balanced diet. The vitamins your body has excess of will be removed through the urine. If you have a balanced diet you can consider stopping the multivitamin. Vitamin supplementation is most helpful when deficient. If not deficient there may not be significant benefit with increased cost. Vitamin D [phone] units per Chandan Fly is beneficial for most adults to help absorb calcium. 3. Are there any meds I can discontinue or combine, my hypertension meds? ? You could consider discontinuing Coq10, the  probiotic, and your multivitamin.  ? There are some combinations we can explore for your hypertension meds. I will discuss these options with Dr. Larose Kells. 4. Could I take lisinopril/hctz instead of the three meds I take for hypertension? (Metoprolol succinate (Toprol) 36m, HCTZ 12.570m Amlodipine 28m69m  ? Will discuss hypertension med options with Dr. PazLarose Kellshere are pros and cons for each combination to consider.  5. Is Coq10 helpful in preventing heart problems? Is it worth taking?  ? All CoQ10 products are not created equal and the most benefit may be for those who have already had a "heart problem" such as a heart attack. Considering the cost and limited benefit for you specifically I would not recommend taking it unless advised by a cardiologist or if it assists with your muscle pain.  6. What about Probiotics? Does Probiotics help with constipation? ? Probiotics help to replace good bacteria in our digestive track. There is limited evidence that probiotics could help with constipation and the specific strain of probiotic purchased matters. The best evidence is with the probiotic strains in Culturelle and Florastor.  7. I take a multi-vitamin; Are they worthwhile and if so then, which ones? ? My thoughts on multivitamins are listed above. When choosing any supplement. I would recommend one that is "USP Verified". These vitamins are analyzed by a group similar to the FDA, but for supplements.    Healthy Eating Following a healthy  eating pattern may help you to achieve and maintain a healthy body weight, reduce the risk of chronic disease, and live a long and productive life. It is important to follow a healthy eating pattern at an appropriate calorie level for your body. Your nutritional needs should be met primarily through food by choosing a variety of nutrient-rich foods. What are tips for following this plan? Reading food labels  Read labels and choose the following: ? Reduced or low  sodium. ? Juices with 100% fruit juice. ? Foods with low saturated fats and high polyunsaturated and monounsaturated fats. ? Foods with whole grains, such as whole wheat, cracked wheat, brown rice, and wild rice. ? Whole grains that are fortified with folic acid. This is recommended for women who are pregnant or who want to become pregnant.  Read labels and avoid the following: ? Foods with a lot of added sugars. These include foods that contain brown sugar, corn sweetener, corn syrup, dextrose, fructose, glucose, high-fructose corn syrup, honey, invert sugar, lactose, malt syrup, maltose, molasses, raw sugar, sucrose, trehalose, or turbinado sugar.  Do not eat more than the following amounts of added sugar per Emilly Lavey:  6 teaspoons (25 g) for women.  9 teaspoons (38 g) for men. ? Foods that contain processed or refined starches and grains. ? Refined grain products, such as white flour, degermed cornmeal, white bread, and white rice. Shopping  Choose nutrient-rich snacks, such as vegetables, whole fruits, and nuts. Avoid high-calorie and high-sugar snacks, such as potato chips, fruit snacks, and candy.  Use oil-based dressings and spreads on foods instead of solid fats such as butter, stick margarine, or cream cheese.  Limit pre-made sauces, mixes, and "instant" products such as flavored rice, instant noodles, and ready-made pasta.  Try more plant-protein sources, such as tofu, tempeh, black beans, edamame, lentils, nuts, and seeds.  Explore eating plans such as the Mediterranean diet or vegetarian diet. Cooking  Use oil to saut or stir-fry foods instead of solid fats such as butter, stick margarine, or lard.  Try baking, boiling, grilling, or broiling instead of frying.  Remove the fatty part of meats before cooking.  Steam vegetables in water or broth. Meal planning    At meals, imagine dividing your plate into fourths: ? One-half of your plate is fruits and  vegetables. ? One-fourth of your plate is whole grains. ? One-fourth of your plate is protein, especially lean meats, poultry, eggs, tofu, beans, or nuts.  Include low-fat dairy as part of your daily diet. Lifestyle  Choose healthy options in all settings, including home, work, school, restaurants, or stores.  Prepare your food safely: ? Wash your hands after handling raw meats. ? Keep food preparation surfaces clean by regularly washing with hot, soapy water. ? Keep raw meats separate from ready-to-eat foods, such as fruits and vegetables. ? Cook seafood, meat, poultry, and eggs to the recommended internal temperature. ? Store foods at safe temperatures. In general:  Keep cold foods at 52F (4.4C) or below.  Keep hot foods at 152F (60C) or above.  Keep your freezer at [facility] (-17.8C) or below.  Foods are no longer safe to eat when they have been between the temperatures of 40-152F (4.4-60C) for more than 2 hours. What foods should I eat? Fruits Aim to eat 2 cup-equivalents of fresh, canned (in natural juice), or frozen fruits each Dravyn Severs. Examples of 1 cup-equivalent of fruit include 1 small apple, 8 large strawberries, 1 cup canned fruit,  cup dried fruit, or  1 cup 100% juice. Vegetables Aim to eat 2-3 cup-equivalents of fresh and frozen vegetables each Jackalyn Haith, including different varieties and colors. Examples of 1 cup-equivalent of vegetables include 2 medium carrots, 2 cups raw, leafy greens, 1 cup chopped vegetable (raw or cooked), or 1 medium baked potato. Grains Aim to eat 6 ounce-equivalents of whole grains each Zoelle Markus. Examples of 1 ounce-equivalent of grains include 1 slice of bread, 1 cup ready-to-eat cereal, 3 cups popcorn, or  cup cooked rice, pasta, or cereal. Meats and other proteins Aim to eat 5-6 ounce-equivalents of protein each Callaway Hardigree. Examples of 1 ounce-equivalent of protein include 1 egg, 1/2 cup nuts or seeds, or 1 tablespoon (16 g) peanut butter. A cut of meat or  fish that is the size of a deck of cards is about 3-4 ounce-equivalents.  Of the protein you eat each week, try to have at least 8 ounces come from seafood. This includes salmon, trout, herring, and anchovies. Dairy Aim to eat 3 cup-equivalents of fat-free or low-fat dairy each Loneta Tamplin. Examples of 1 cup-equivalent of dairy include 1 cup (240 mL) milk, 8 ounces (250 g) yogurt, 1 ounces (44 g) natural cheese, or 1 cup (240 mL) fortified soy milk. Fats and oils  Aim for about 5 teaspoons (21 g) per Jidenna Figgs. Choose monounsaturated fats, such as canola and olive oils, avocados, peanut butter, and most nuts, or polyunsaturated fats, such as sunflower, corn, and soybean oils, walnuts, pine nuts, sesame seeds, sunflower seeds, and flaxseed. Beverages  Aim for six 8-oz glasses of water per Haidar Muse. Limit coffee to three to five 8-oz cups per Jerime Arif.  Limit caffeinated beverages that have added calories, such as soda and energy drinks.  Limit alcohol intake to no more than 1 drink a Yahshua Thibault for nonpregnant women and 2 drinks a Yerick Eggebrecht for men. One drink equals 12 oz of beer (355 mL), 5 oz of wine (148 mL), or 1 oz of hard liquor (44 mL). Seasoning and other foods  Avoid adding excess amounts of salt to your foods. Try flavoring foods with herbs and spices instead of salt.  Avoid adding sugar to foods.  Try using oil-based dressings, sauces, and spreads instead of solid fats. This information is based on general [place] nutrition guidelines. For more information, visit [employer]. Exact amounts may vary based on your nutrition needs. Summary  A healthy eating plan may help you to maintain a healthy weight, reduce the risk of chronic diseases, and stay active throughout your life.  Plan your meals. Make sure you eat the right portions of a variety of nutrient-rich foods.  Try baking, boiling, grilling, or broiling instead of frying.  Choose healthy options in all settings, including home, work, school,  restaurants, or stores. This information is not intended to replace advice given to you by your health care provider. Make sure you discuss any questions you have with your health care provider. Document Revised: 04/27/2017 Document Reviewed: 04/27/2017 Elsevier Patient Education  [place].

## 2019-09-28 NOTE — Patient Instructions (Signed)
Visit Information  Goals Addressed   None     Mr. Antonelli was given information about Chronic Care Management services today including:  1. CCM service includes personalized support from designated clinical staff supervised by his physician, including individualized plan of care and coordination with other care providers 2. 24/7 contact phone numbers for assistance for urgent and routine care needs. 3. Standard insurance, coinsurance, copays and deductibles apply for chronic care management only during months in which we provide at least 20 minutes of these services. Most insurances cover these services at 100%, however patients may be responsible for any copay, coinsurance and/or deductible if applicable. This service may help you avoid the need for more expensive face-to-face services. 4. Only one practitioner may furnish and bill the service in a calendar month. 5. The patient may stop CCM services at any time (effective at the end of the month) by phone call to the office staff.  Patient agreed to services and verbal consent obtained.   The patient verbalized understanding of instructions provided today and agreed to receive a mailed copy of patient instruction and/or educational materials. Telephone follow up appointment with pharmacy team member scheduled for: 09/28/19  Melvenia Beam Maisley Hainsworth, PharmD Clinical Pharmacist [facility] [phone]   Answers to your questions presented at the office visit:  1. Last cholesterol test, 171, close to high mark. Triglycerides, 163, high  Total cholesterol goal is less than 200. 171 is satisfactory.   Triglyceride goal is less than 150. You can reduce triglycerides my limiting the amount of fried foods and carbohydrates you consume. We will also see if the new cholesterol medication can assist.  2. Potassium and chloride, high. Should I stop multi-vitamins and are they that helpful. Should I just take certain  ones?  Most recent potassium and chloride are not elevated, in fact most recent chloride is borderline low, but not of great concern. Multivitamins are not as beneficial for those with a balanced diet. The vitamins your body has excess of will be removed through the urine. If you have a balanced diet you can consider stopping the multivitamin. Vitamin supplementation is most helpful when deficient. If not deficient there may not be significant benefit with increased cost. Vitamin D [phone] units per Chasya Zenz is beneficial for most adults to help absorb calcium. 3. Are there any meds I can discontinue or combine, my hypertension meds?  You could consider discontinuing Coq10, the probiotic, and your multivitamin.   There are some combinations we can explore for your hypertension meds. I will discuss these options with Dr. Larose Kells. 4. Could I take lisinopril/hctz instead of the three meds I take for hypertension? (Metoprolol succinate (Toprol) 78m, HCTZ 12.581m Amlodipine 61m62m   Will discuss hypertension med options with Dr. PazLarose Kellshere are pros and cons for each combination to consider.  5. Is Coq10 helpful in preventing heart problems? Is it worth taking?   All CoQ10 products are not created equal and the most benefit may be for those who have already had a "heart problem" such as a heart attack. Considering the cost and limited benefit for you specifically I would not recommend taking it unless advised by a cardiologist or if it assists with your muscle pain.  6. What about Probiotics? Does Probiotics help with constipation?  Probiotics help to replace good bacteria in our digestive track. There is limited evidence that probiotics could help with constipation and the specific strain of probiotic purchased matters. The best evidence is  with the probiotic strains in Culture[place] and Florastor.  7. I take a multi-vitamin; Are they worthwhile and if so then, which ones?  My thoughts on multivitamins are listed  above. When choosing any supplement. I would recommend one that is "USP Verified". These vitamins are analyzed by a group similar to the FDA, but for supplements.    Healthy Eating Following a healthy eating pattern may help you to achieve and maintain a healthy body weight, reduce the risk of chronic disease, and live a long and productive life. It is important to follow a healthy eating pattern at an appropriate calorie level for your body. Your nutritional needs should be met primarily through food by choosing a variety of nutrient-rich foods. What are tips for following this plan? Reading food labels  Read labels and choose the following: ? Reduced or low sodium. ? Juices with 100% fruit juice. ? Foods with low saturated fats and high polyunsaturated and monounsaturated fats. ? Foods with whole grains, such as whole wheat, cracked wheat, brown rice, and wild rice. ? Whole grains that are fortified with folic acid. This is recommended for women who are pregnant or who want to become pregnant.  Read labels and avoid the following: ? Foods with a lot of added sugars. These include foods that contain brown sugar, corn sweetener, corn syrup, dextrose, fructose, glucose, high-fructose corn syrup, honey, invert sugar, lactose, malt syrup, maltose, molasses, raw sugar, sucrose, trehalose, or turbinado sugar.  Do not eat more than the following amounts of added sugar per Aleksis Jiggetts:  6 teaspoons (25 g) for women.  9 teaspoons (38 g) for men. ? Foods that contain processed or refined starches and grains. ? Refined grain products, such as white flour, degermed cornmeal, white bread, and white rice. Shopping  Choose nutrient-rich snacks, such as vegetables, whole fruits, and nuts. Avoid high-calorie and high-sugar snacks, such as potato chips, fruit snacks, and candy.  Use oil-based dressings and spreads on foods instead of solid fats such as butter, stick margarine, or cream cheese.  Limit pre-made  sauces, mixes, and "instant" products such as flavored rice, instant noodles, and ready-made pasta.  Try more plant-protein sources, such as tofu, tempeh, black beans, edamame, lentils, nuts, and seeds.  Explore eating plans such as the Mediterranean diet or vegetarian diet. Cooking  Use oil to saut or stir-fry foods instead of solid fats such as butter, stick margarine, or lard.  Try baking, boiling, grilling, or broiling instead of frying.  Remove the fatty part of meats before cooking.  Steam vegetables in water or broth. Meal planning   At meals, imagine dividing your plate into fourths: ? One-half of your plate is fruits and vegetables. ? One-fourth of your plate is whole grains. ? One-fourth of your plate is protein, especially lean meats, poultry, eggs, tofu, beans, or nuts.  Include low-fat dairy as part of your daily diet. Lifestyle  Choose healthy options in all settings, including home, work, school, restaurants, or stores.  Prepare your food safely: ? Wash your hands after handling raw meats. ? Keep food preparation surfaces clean by regularly washing with hot, soapy water. ? Keep raw meats separate from ready-to-eat foods, such as fruits and vegetables. ? Cook seafood, meat, poultry, and eggs to the recommended internal temperature. ? Store foods at safe temperatures. In general:  Keep cold foods at 36F (4.4C) or below.  Keep hot foods at 136F (60C) or above.  Keep your freezer at [facility] (-17.8C) or below.  Foods are  no longer safe to eat when they have been between the temperatures of 40-140F (4.4-60C) for more than 2 hours. What foods should I eat? Fruits Aim to eat 2 cup-equivalents of fresh, canned (in natural juice), or frozen fruits each Maron Stanzione. Examples of 1 cup-equivalent of fruit include 1 small apple, 8 large strawberries, 1 cup canned fruit,  cup dried fruit, or 1 cup 100% juice. Vegetables Aim to eat 2-3 cup-equivalents of fresh and frozen  vegetables each Lebert Lovern, including different varieties and colors. Examples of 1 cup-equivalent of vegetables include 2 medium carrots, 2 cups raw, leafy greens, 1 cup chopped vegetable (raw or cooked), or 1 medium baked potato. Grains Aim to eat 6 ounce-equivalents of whole grains each Mercedez Boule. Examples of 1 ounce-equivalent of grains include 1 slice of bread, 1 cup ready-to-eat cereal, 3 cups popcorn, or  cup cooked rice, pasta, or cereal. Meats and other proteins Aim to eat 5-6 ounce-equivalents of protein each Gussie Towson. Examples of 1 ounce-equivalent of protein include 1 egg, 1/2 cup nuts or seeds, or 1 tablespoon (16 g) peanut butter. A cut of meat or fish that is the size of a deck of cards is about 3-4 ounce-equivalents.  Of the protein you eat each week, try to have at least 8 ounces come from seafood. This includes salmon, trout, herring, and anchovies. Dairy Aim to eat 3 cup-equivalents of fat-free or low-fat dairy each Joletta Manner. Examples of 1 cup-equivalent of dairy include 1 cup (240 mL) milk, 8 ounces (250 g) yogurt, 1 ounces (44 g) natural cheese, or 1 cup (240 mL) fortified soy milk. Fats and oils  Aim for about 5 teaspoons (21 g) per Jeorge Reister. Choose monounsaturated fats, such as canola and olive oils, avocados, peanut butter, and most nuts, or polyunsaturated fats, such as sunflower, corn, and soybean oils, walnuts, pine nuts, sesame seeds, sunflower seeds, and flaxseed. Beverages  Aim for six 8-oz glasses of water per Lillar Bianca. Limit coffee to three to five 8-oz cups per Zakarie Sturdivant.  Limit caffeinated beverages that have added calories, such as soda and energy drinks.  Limit alcohol intake to no more than 1 drink a Madiha Bambrick for nonpregnant women and 2 drinks a Raeven Pint for men. One drink equals 12 oz of beer (355 mL), 5 oz of wine (148 mL), or 1 oz of hard liquor (44 mL). Seasoning and other foods  Avoid adding excess amounts of salt to your foods. Try flavoring foods with herbs and spices instead of salt.  Avoid  adding sugar to foods.  Try using oil-based dressings, sauces, and spreads instead of solid fats. This information is based on general [place] nutrition guidelines. For more information, visit [employer]. Exact amounts may vary based on your nutrition needs. Summary  A healthy eating plan may help you to maintain a healthy weight, reduce the risk of chronic diseases, and stay active throughout your life.  Plan your meals. Make sure you eat the right portions of a variety of nutrient-rich foods.  Try baking, boiling, grilling, or broiling instead of frying.  Choose healthy options in all settings, including home, work, school, restaurants, or stores. This information is not intended to replace advice given to you by your health care provider. Make sure you discuss any questions you have with your health care provider. Document Revised: 04/27/2017 Document Reviewed: 04/27/2017 Elsevier Patient Education  [place].

## 2019-09-28 NOTE — Chronic Care Management (AMB) (Signed)
Chronic Care Management Pharmacy  Name: SALIOU BARNIER  MRN: [id number] DOB: 04/11/1932  Chief Complaint/ HPI  Michael Johns,  86 y.o. , male presents for their Follow-Up CCM visit with the clinical pharmacist via telephone.  PCP : Colon Branch, MD  Their chronic conditions include: Hypertension, Hyperlipidemia, GERD, Chronic Constipation  Office Visits: 08/29/19: Visit w/ Dr. Larose Kells - Recommended D/C aspirin.   Consult Visit: None in the last 6 months  Medications: Outpatient Encounter Medications as of 09/28/2019  Medication Sig  . amLODipine (NORVASC) 5 MG tablet Take 1 tablet (5 mg total) by mouth daily.  Marland Kitchen atorvastatin (LIPITOR) 10 MG tablet Take 1 tablet (10 mg total) by mouth daily.  Marland Kitchen Co-Enzyme Q10 200 MG CAPS Take 1 capsule by mouth daily.  . hydrochlorothiazide (HYDRODIURIL) 12.5 MG tablet Take 1 tablet (12.5 mg total) by mouth daily.  Marland Kitchen lactulose, encephalopathy, (ENULOSE) 10 GM/15ML SOLN Take 45 mLs (30 g total) by mouth daily as needed.  . metoprolol succinate (TOPROL-XL) 25 MG 24 hr tablet Take 1 tablet (25 mg total) by mouth daily.  . Multiple Vitamin (MULTIVITAMIN) tablet Take 1 tablet by mouth daily.    Marland Kitchen omeprazole (PRILOSEC) 20 MG capsule Take 1 capsule (20 mg total) by mouth daily.  . polyethylene glycol (MIRALAX / GLYCOLAX) packet Take 17 g by mouth as needed. Reported on 06/15/2015  . Probiotic Product (PROBIOTIC-10 PO) Take 1 capsule by mouth daily.   No facility-administered encounter medications on file as of 09/28/2019.   SDOH Screenings   Alcohol Screen:   . Last Alcohol Screening Score (AUDIT): Not on file  Depression (PHQ2-9): Low Risk   . PHQ-2 Score: 0  Financial Resource Strain: Low Risk   . Difficulty of Paying Living Expenses: Not very hard  Food Insecurity:   . Worried About Charity fundraiser in the Last Year: Not on file  . Ran Out of Food in the Last Year: Not on file  Housing:   . Last Housing Risk Score: Not on file  Physical Activity:  Insufficiently Active  . Days of Exercise per Week: 3 days  . Minutes of Exercise per Session: 40 min  Social Connections:   . Frequency of Communication with Friends and Family: Not on file  . Frequency of Social Gatherings with Friends and Family: Not on file  . Attends Religious Services: Not on file  . Active Member of Clubs or Organizations: Not on file  . Attends Archivist Meetings: Not on file  . Marital Status: Not on file  Stress:   . Feeling of Stress : Not on file  Tobacco Use: Low Risk   . Smoking Tobacco Use: Never Smoker  . Smokeless Tobacco Use: Never Used  Transportation Needs:   . Film/video editor (Medical): Not on file  . Lack of Transportation (Non-Medical): Not on file     Current Diagnosis/Assessment:  Goals Addressed            This Visit's Progress   . Chronic Care Management Pharmacy Care Plan       CARE PLAN ENTRY (see longitudinal plan of care for additional care plan information)  Current Barriers:  . Chronic Disease Management support, education, and care coordination needs related to Hypertension, Hyperlipidemia, GERD, Chronic Constipation   Hypertension BP Readings from Last 3 Encounters:  09/19/19 (!) 135/57  08/29/19 (!) 132/77  02/28/19 (!) 137/58   . Pharmacist Clinical Goal(s): o Over the next 90 days, patient  will work with PharmD and providers to maintain BP goal <140/90 . Current regimen:  . Amlodipine 59m daily . HCTZ 12.559mdaily  . Metoprolol succinate 2539maily  . Interventions: o Discussed BP med options . Patient self care activities - Over the next 90 days, patient will: o Check BP 2-3 times per week, document, and provide at future appointments o Ensure daily salt intake < 2300 mg/Shakeerah Gradel  Hyperlipidemia Lab Results  Component Value Date/Time   LDLCALC 88 02/28/2019 08:20 AM   LDLDIRECT 150.2 04/16/2011 09:11 AM   . Pharmacist Clinical Goal(s): o Over the next 90 days, patient will work with PharmD  and providers to maintain LDL goal < 100 . Current regimen:  . Atorvastatin 59m88mily  . CoQ10 200mg49mly . Interventions: o Consider D/C CoQ10 if not providing benefit o Collaboration with provider regarding medication management (changing atorvastatin 59mg 22mosuvastatin 5mg to88me if this helps with muscle ache) . Patient self care activities - Over the next 90 days, patient will: o Maintain cholesterol medication regimen.   GERD . Pharmacist Clinical Goal(s) o Over the next 90 days, patient will work with PharmD and providers to reduce symptoms of GERD and polypharmacy . Current regimen:  o Omeprazole 20mg ev2mother Anallely Rosell . Interventions: o Discussed tapering plan . Patient self care activities - Over the next 90 days, patient will: o Continue tapering plan noting that if he has symptoms he can supplement with famotidine (generic)/Pepcid (name brand)   Chronic Constipation . Pharmacist Clinical Goal(s) o Over the next 90 days, patient will work with PharmD and providers to reduce symptoms of constipation . Current regimen:  . Lactulose 10g/15mL 83m59m3067maily . Miralax 17g daily . Interventions: o We discussed D/C probiotic and initial visit. Pt has tolerated stopping probiotic . Patient self care activities - Over the next 90 days, patient will: o Maintain constipation medication regimen   Medication management . Pharmacist Clinical Goal(s): o Over the next 90 days, patient will work with PharmD and providers to maintain optimal medication adherence . Current pharmacy: [facility] o Comprehensive medication review performed. o Continue current medication management strategy . Patient self care activities - Over the next 90 days, patient will: o Focus on medication adherence by filling and taking medications appropriately  o Take medications as prescribed o Report any questions or concerns to PharmD and/or provider(s)  Please see  past updates related to this goal by clicking on the "Past Updates" button in the selected goal        Patient brings in a list of specific questions to address during visit. His questions are bolded below. Will provide a copy with printed answers as below noting answers were also discussed during the visit 1. Last cholesterol test, 171, close to high mark. Triglycerides, 163, high  Total cholesterol goal is less than 200. 171 is satisfactory.   Triglyceride goal is less than 150. You can reduce triglycerides my limiting the amount of fried foods and carbohydrates you consume. We will also see if the new cholesterol medication can assist.  2. Potassium and chloride, high. Should I stop multi-vitamins and are they that helpful. Should I just take certain ones?  Most recent potassium and chloride are not elevated, in fact most recent chloride is borderline low, but not of great concern. Multivitamins are not as beneficial for those with a balanced diet. The vitamins your body has excess of will be removed through the  urine. If you have a balanced diet you can consider stopping the multivitamin. Vitamin supplementation is most helpful when deficient. If not deficient there may not be significant benefit with increased cost. Vitamin D [phone] units per Zabdi Mis is beneficial for most adults to help absorb calcium. 3. Are there any meds I can discontinue or combine, my hypertension meds?  You could consider discontinuing Coq10, the probiotic, and your multivitamin.   There are some combinations we can explore for your hypertension meds. I will discuss these options with Dr. Larose Kells. 4. Could I take lisinopril/hctz instead of the three meds I take for hypertension? (Metoprolol succinate (Toprol) 20m, HCTZ 12.511m Amlodipine 55m19m   Will discuss hypertension med options with Dr. PazLarose Kellshere are pros and cons for each combination to consider.  5. Is Coq10 helpful in preventing heart problems? Is it worth  taking?   All CoQ10 products are not created equal and the most benefit may be for those who have already had a "heart problem" such as a heart attack. Considering the cost and limited benefit for you specifically I would not recommend taking it unless advised by a cardiologist or if it assists with your muscle pain.  6. What about Probiotics? Does Probiotics help with constipation?  Probiotics help to replace good bacteria in our digestive track. There is limited evidence that probiotics could help with constipation and the specific strain of probiotic purchased matters. The best evidence is with the probiotic strains in Culturelle and Florastor.  7. I take a multi-vitamin; Are they worthwhile and if so then, which ones?  My thoughts on multivitamins are listed above. When choosing any supplement. I would recommend one that is "USP Verified". These vitamins are analyzed by a group similar to the FDA, but for supplements.   Hypertension   BP goal is:  <140/90  Office blood pressures are  BP Readings from Last 3 Encounters:  09/19/19 (!) 135/57  08/29/19 (!) 132/77  02/28/19 (!) 137/58   CMP Latest Ref Rng & Units 08/29/2019 02/28/2019 08/25/2018  Glucose 70 - 99 mg/dL 92 92 100(H)  BUN 6 - 23 mg/dL '16 15 15  ' Creatinine 0.40 - 1.50 mg/dL 1.11 1.12 1.25  Sodium 135 - 145 mEq/L 131(L) 131(L) 134(L)  Potassium 3.5 - 5.1 mEq/L 4.7 4.6 4.4  Chloride 96 - 112 mEq/L 95(L) 93(L) 97  CO2 19 - 32 mEq/L '29 30 31  ' Calcium 8.4 - 10.5 mg/dL 9.5 9.7 9.8  Total Protein 6.0 - 8.3 g/dL - 7.2 -  Total Bilirubin 0.2 - 1.2 mg/dL - 0.7 -  Alkaline Phos 39 - 117 U/L - 73 -  AST 0 - 37 U/L - 22 -  ALT 0 - 53 U/L - 16 -   Kidney Function Lab Results  Component Value Date/Time   CREATININE 1.11 08/29/2019 08:27 AM   CREATININE 1.12 02/28/2019 08:20 AM   GFR 62.91 08/29/2019 08:27 AM   GFRNONAA 77.48 01/17/2009 09:09 AM   GFRAA  12/15/2007 09:00 AM    >60        The eGFR has been calculated using the  MDRD equation. This calculation has not been validated in all clinical   K 4.7 08/29/2019 08:27 AM   K 4.6 02/28/2019 08:20 AM    Patient checks BP at home daily  Patient has failed these meds in the past: None noted  Patient is currently controlled on the following medications:  . Amlodipine 55mg2mily . HCTZ 12.55mg 30mly  . Metoprolol  succinate 53m daily   Fill Dates Per Dispensing Hx: Appropriately filled Amlodipine (07/05/19 90DS, 04/15/19 90DS) HCTZ (07/05/19 90DS, 04/15/19 90DS) Metoprolol (09/01/19 90DS, 06/17/19 90DS, 04/15/19 90DS)  Patient is interested in simplifying his medication regimen. He asks about taking lisinopril noting his wife takes it in a combo pill reducing the number of pill she has to take.  We thoroughly discussed possible medication regimens for patient noting the pros/cons of each regimen.   Med Options Lisinopril/hctz combo to replace amlodipine and hctz Pros: Reduces pill burden, ACEI risk of hyperkalemia balanced by hctz Cons: Continuing hctz could continue low/boderline low Chloride and Sodium  Benazepril/amlodipine combo to replace amlodipine and hctz Pros: Might reduce risk of hypochloremia and hyponatremia noting pt's values have been low Cons: Risk of hyperkalemia without hctz  Eventual taper off of metoprolol once BP stable (no compelling indication for beta blocker, but note that patient has taken this for years and may not tolerate taper)  Will discuss options with Dr. PLarose Kells We discussed Med options noting pros and cons, BP goal   Update 09/28/19 Still consulting with Dr. PLarose Kellsabout med changes.  Patient Reported BP '120 70 71 139 65 56 129 66 63 138 ' 65 68 114 70 58 126 65 59 Average 127.6 66.8 62.5   Plan -Continue current medications  -Check BP 2-3 times per week and record    Hyperlipidemia   LDL goal < 100  Lipid Panel     Component Value Date/Time   CHOL 171 02/28/2019 0820   TRIG 163.0 (H) 02/28/2019 0820   HDL 50.20  02/28/2019 0820   LDLCALC 88 02/28/2019 0820   LDLDIRECT 150.2 04/16/2011 0911    Hepatic Function Latest Ref Rng & Units 02/28/2019 02/09/2018 02/04/2017  Total Protein 6.0 - 8.3 g/dL 7.2 - 7.8  Albumin 3.5 - 5.2 g/dL 4.2 - 4.4  AST 0 - 37 U/L '22 22 20  ' ALT 0 - 53 U/L '16 20 15  ' Alk Phosphatase 39 - 117 U/L 73 - 60  Total Bilirubin 0.2 - 1.2 mg/dL 0.7 - 0.7     The ASCVD Risk score (GRockingham, et al., 2013) failed to calculate for the following reasons:   The 2013 ASCVD risk score is only valid for ages 457to 785  Patient has failed these meds in past: None noted  Patient is currently controlled, except for TRIG on the following medications:  . Atorvastatin 160mdaily  . CoQ10 20051maily  Fill Dates Per Dispensing Hx - Appropriately filled Atorvastatin (07/05/19 90DS, 04/15/19 90DS)  We discussed:  LDL and TRIG goals.    He states he experiences cramping while on atorvastatin 73m13md wonders what other options are available. Can consider changing to rosuvastatin as it is more hydrophilic and could possibly cause less muscle cramping. He states that the coq10 has not assisted in reducing cramping. Discussed that CoQ10 may not be beneficial and he could D/C this medication. He requests that if his statin is changed to rosuvastatin that it is sent to [facility].  Update 09/28/19 Plans to take Co10 until it runs out and then D/C Takes Tu/Th/Sat Excited to start back playing golf once it stops raining. Plays golf about 3 times per week.  Plan -Consider discontinuation of CoQ10 -Consider changing atorvastatin 73mg41mrosuvastatin 5mg d83my  GERD   Patient has failed these meds in past: None noted  Patient is currently controlled on the following medications:  . Omeprazole 20mg d13m  Patient attempted to D/C omeprazole in the past. Does not recall if he tapered his dose or not.  Patient interested in tapering off of PPI.  We discussed:  the risk/benefit of continuation vs  discontinuation of long term PPI use  We discussed tapering over a 5 week course.  Update 09/28/19 Started taking every other Mohamud Mrozek.  Would like to continue doing this for a couple of weeks and then move to taking every 3rd Juwon Scripter. Agreeable to this plan.  Plan -Continue PPI taper and supplement with pepcid if needed (pt would like to receive a script for pepcid if needed as this might be covered better under insurance)   Chronic Constipation    Patient has failed these meds in past: None noted  Patient is currently controlled on the following medications: . Lactulose 10g/12m 436m (30g) daily . Miralax 17g daily (was taking every 3rd Teyona Nichelson)  Pt did not notice a difference in constipation since starting the probiotic. Wonders if he can D/C.   We discussed:  that if the probiotic did not change his bowel habits and that was his main reason for taking it then he could D/C   Update 09/28/19 Did stop the probiotic and did not experience any change in his bowel habits. Started taking miralax daily and developed diarrhea, so he reduced his dosing of miralax   Plan -Continue current medications  Vaccines   Reviewed and discussed patient's vaccination history.  Pt is up to date on vaccines.  Immunization History  Administered Date(s) Administered  . Influenza Split 10/30/2010, 10/29/2011  . Influenza Whole 11/11/2006, 11/17/2007, 10/25/2008, 10/30/2009  . Influenza, High Dose Seasonal PF 10/18/2015  . Influenza,inj,Quad PF,6+ Mos 10/20/2012, 10/19/2013, 10/11/2014, 09/08/2018  . Influenza-Unspecified 09/17/2016, 10/27/2017  . PFIZER SARS-COV-2 Vaccination 02/09/2019, 03/01/2019  . Pneumococcal Conjugate-13 09/07/2013  . Pneumococcal Polysaccharide-23 05/05/2001, 10/15/2007, 08/06/2017  . Td 03/29/2001  . Tdap 04/16/2011  . Zoster 03/10/2007  . Zoster Recombinat (Shingrix) 09/17/2016, 01/26/2017   Update 09/28/19 [facility]. States pharmacist informed him this information would be sent to the clinic.  Miscellaneous Meds Patient interested in decreasing polypharmacy. Would like to get all of his medications filled through [facility] Multivitamin (discussed that he could D/C this if he felt he had a balanced diet) Aspirin (states he stomach felt better when he D/C aspirin)  Follow up: 1 month phone visit

## 2019-10-26 ENCOUNTER — Other Ambulatory Visit: Payer: Self-pay | Admitting: Internal Medicine

## 2019-11-02 ENCOUNTER — Ambulatory Visit: Payer: Medicare Other | Admitting: Pharmacist

## 2019-11-02 DIAGNOSIS — I10 Essential (primary) hypertension: Secondary | ICD-10-CM

## 2019-11-02 DIAGNOSIS — K219 Gastro-esophageal reflux disease without esophagitis: Secondary | ICD-10-CM

## 2019-11-02 NOTE — Chronic Care Management (AMB) (Signed)
Chronic Care Management Pharmacy  Name: AVI KERSCHNER  MRN: [id number] DOB: 1932-11-02  Chief Complaint/ HPI  Michael Johns,  86 y.o. , male presents for their Follow-Up CCM visit with the clinical pharmacist via telephone.  PCP : Colon Branch, MD  Their chronic conditions include: Hypertension, Hyperlipidemia, GERD, Chronic Constipation  Office Visits: None since last CCM visit on 09/28/2019.   Consult Visit: None since last CCM visit on 09/28/2019.   Medications: Outpatient Encounter Medications as of 11/02/2019  Medication Sig  . metoprolol succinate (TOPROL-XL) 25 MG 24 hr tablet Take 1 tablet (25 mg total) by mouth daily.  Marland Kitchen Co-Enzyme Q10 200 MG CAPS Take 1 capsule by mouth daily.  Marland Kitchen lactulose, encephalopathy, (ENULOSE) 10 GM/15ML SOLN Take 45 mLs (30 g total) by mouth daily as needed.  . Multiple Vitamin (MULTIVITAMIN) tablet Take 1 tablet by mouth daily.    Marland Kitchen omeprazole (PRILOSEC) 20 MG capsule Take 1 capsule (20 mg total) by mouth daily.  . polyethylene glycol (MIRALAX / GLYCOLAX) packet Take 17 g by mouth as needed. Reported on 06/15/2015  . Probiotic Product (PROBIOTIC-10 PO) Take 1 capsule by mouth daily.  . [DISCONTINUED] amLODipine (NORVASC) 5 MG tablet Take 1 tablet (5 mg total) by mouth daily.  . [DISCONTINUED] atorvastatin (LIPITOR) 10 MG tablet Take 1 tablet (10 mg total) by mouth daily.  . [DISCONTINUED] hydrochlorothiazide (HYDRODIURIL) 12.5 MG tablet Take 1 tablet (12.5 mg total) by mouth daily.   No facility-administered encounter medications on file as of 11/02/2019.   SDOH Screenings   Alcohol Screen:   . Last Alcohol Screening Score (AUDIT): Not on file  Depression (PHQ2-9): Low Risk   . PHQ-2 Score: 0  Financial Resource Strain: Low Risk   . Difficulty of Paying Living Expenses: Not very hard  Food Insecurity:   . Worried About Charity fundraiser in the Last Year: Not on file  . Ran Out of Food in the Last Year: Not on file  Housing:   . Last Housing  Risk Score: Not on file  Physical Activity: Insufficiently Active  . Days of Exercise per Week: 3 days  . Minutes of Exercise per Session: 40 min  Social Connections:   . Frequency of Communication with Friends and Family: Not on file  . Frequency of Social Gatherings with Friends and Family: Not on file  . Attends Religious Services: Not on file  . Active Member of Clubs or Organizations: Not on file  . Attends Archivist Meetings: Not on file  . Marital Status: Not on file  Stress:   . Feeling of Stress : Not on file  Tobacco Use: Low Risk   . Smoking Tobacco Use: Never Smoker  . Smokeless Tobacco Use: Never Used  Transportation Needs:   . Film/video editor (Medical): Not on file  . Lack of Transportation (Non-Medical): Not on file     Current Diagnosis/Assessment:  Goals Addressed            This Visit's Progress   . Chronic Care Management Pharmacy Care Plan       CARE PLAN ENTRY (see longitudinal plan of care for additional care plan information)  Current Barriers:  . Chronic Disease Management support, education, and care coordination needs related to Hypertension, Hyperlipidemia, GERD, Chronic Constipation   Hypertension BP Readings from Last 3 Encounters:  09/19/19 (!) 135/57  08/29/19 (!) 132/77  02/28/19 (!) 137/58   . Pharmacist Clinical Goal(s): o Over the next  90 days, patient will work with PharmD and providers to maintain BP goal <140/90 . Current regimen:  . Amlodipine 34m daily . HCTZ 12.557mdaily  . Metoprolol succinate 2564maily  . Interventions: o Discussed BP med options . Patient self care activities - Over the next 90 days, patient will: o Check BP 2-3 times per week, document, and provide at future appointments o Ensure daily salt intake < 2300 mg/Audie Wieser  Hyperlipidemia Lab Results  Component Value Date/Time   LDLCALC 88 02/28/2019 08:20 AM   LDLDIRECT 150.2 04/16/2011 09:11 AM   . Pharmacist Clinical Goal(s): o Over  the next 90 days, patient will work with PharmD and providers to maintain LDL goal < 100 . Current regimen:  . Atorvastatin 74m38mily  . CoQ10 200mg12mly . Interventions: o Consider D/C CoQ10 if not providing benefit o Collaboration with provider regarding medication management (changing atorvastatin 74mg 67mosuvastatin 5mg to70me if this helps with muscle ache) . Patient self care activities - Over the next 90 days, patient will: o Maintain cholesterol medication regimen.   GERD . Pharmacist Clinical Goal(s) o Over the next 90 days, patient will work with PharmD and providers to reduce symptoms of GERD and polypharmacy . Current regimen:  o Omeprazole 20mg ev57mother Torre Pikus . Interventions: o Discussed tapering plan . Patient self care activities - Over the next 90 days, patient will: o Continue tapering plan noting that if he has symptoms he can supplement with famotidine (generic)/Pepcid (name brand)   Chronic Constipation . Pharmacist Clinical Goal(s) o Over the next 90 days, patient will work with PharmD and providers to reduce symptoms of constipation . Current regimen:  . Lactulose 10g/15mL 7m59m3067maily . Miralax 17g daily . Interventions: o We discussed D/C probiotic and initial visit. Pt has tolerated stopping probiotic . Patient self care activities - Over the next 90 days, patient will: o Maintain constipation medication regimen   Medication management . Pharmacist Clinical Goal(s): o Over the next 90 days, patient will work with PharmD and providers to maintain optimal medication adherence . Current [facility] o Comprehensive medication review performed. o Continue current medication management strategy . Patient self care activities - Over the next 90 days, patient will: o Focus on medication adherence by filling and taking medications appropriately  o Take medications as prescribed o Report any questions or  concerns to PharmD and/or provider(s)  Please see past updates related to this goal by clicking on the "Past Updates" button in the selected goal         Hypertension   BP goal is:  <140/90  Office blood pressures are  BP Readings from Last 3 Encounters:  09/19/19 (!) 135/57  08/29/19 (!) 132/77  02/28/19 (!) 137/58   CMP Latest Ref Rng & Units 08/29/2019 02/28/2019 08/25/2018  Glucose 70 - 99 mg/dL 92 92 100(H)  BUN 6 - 23 mg/dL '16 15 15  ' Creatinine 0.40 - 1.50 mg/dL 1.11 1.12 1.25  Sodium 135 - 145 mEq/L 131(L) 131(L) 134(L)  Potassium 3.5 - 5.1 mEq/L 4.7 4.6 4.4  Chloride 96 - 112 mEq/L 95(L) 93(L) 97  CO2 19 - 32 mEq/L '29 30 31  ' Calcium 8.4 - 10.5 mg/dL 9.5 9.7 9.8  Total Protein 6.0 - 8.3 g/dL - 7.2 -  Total Bilirubin 0.2 - 1.2 mg/dL - 0.7 -  Alkaline Phos 39 - 117 U/L - 73 -  AST 0 - 37 U/L - 22 -  ALT 0 - 53 U/L - 16 -   Kidney Function Lab Results  Component Value Date/Time   CREATININE 1.11 08/29/2019 08:27 AM   CREATININE 1.12 02/28/2019 08:20 AM   GFR 62.91 08/29/2019 08:27 AM   GFRNONAA 77.48 01/17/2009 09:09 AM   GFRAA  12/15/2007 09:00 AM    >60        The eGFR has been calculated using the MDRD equation. This calculation has not been validated in all clinical   K 4.7 08/29/2019 08:27 AM   K 4.6 02/28/2019 08:20 AM    Patient checks BP at home daily  Patient has failed these meds in the past: None noted  Patient is currently controlled on the following medications:  . Amlodipine 59m daily . HCTZ 12.552mdaily  . Metoprolol succinate 2533maily   Fill Dates Per Dispensing Hx: Appropriately filled Amlodipine (07/05/19 90DS, 04/15/19 90DS) HCTZ (07/05/19 90DS, 04/15/19 90DS) Metoprolol (09/01/19 90DS, 06/17/19 90DS, 04/15/19 90DS)  Patient is interested in simplifying his medication regimen. He asks about taking lisinopril noting his wife takes it in a combo pill reducing the number of pill she has to take.  We thoroughly discussed possible medication  regimens for patient noting the pros/cons of each regimen.   Med Options Lisinopril/hctz combo to replace amlodipine and hctz Pros: Reduces pill burden, ACEI risk of hyperkalemia balanced by hctz Cons: Continuing hctz could continue low/boderline low Chloride and Sodium  Benazepril/amlodipine combo to replace amlodipine and hctz Pros: Might reduce risk of hypochloremia and hyponatremia noting pt's values have been low Cons: Risk of hyperkalemia without hctz  Eventual taper off of metoprolol once BP stable (no compelling indication for beta blocker, but note that patient has taken this for years and may not tolerate taper)  Will discuss options with Dr. PazLarose Kellse discussed Med options noting pros and cons, BP goal   Update 09/28/19 Still consulting with Dr. PazLarose Kellsout med changes.  Patient Reported BP '120 70 71 139 65 56 129 66 63 138 ' 65 68 114 70 58 126 65 59 Average 127.6 66.8 62.5  Update 11/02/19 States he has been checking his BP regularly and they have remained within range. He would like to simplify his regimen, but is ok to continue if that is what Dr. PazLarose Kellsefers. He is at the store right now. Asks that I call back when he gets home to obtain specific blood pressure readings.  Plan -Continue current medications  -Check BP 2-3 times per week and record    Hyperlipidemia   LDL goal < 100  Lipid Panel     Component Value Date/Time   CHOL 171 02/28/2019 0820   TRIG 163.0 (H) 02/28/2019 0820   HDL 50.20 02/28/2019 0820   LDLCALC 88 02/28/2019 0820   LDLDIRECT 150.2 04/16/2011 0911    Hepatic Function Latest Ref Rng & Units 02/28/2019 02/09/2018 02/04/2017  Total Protein 6.0 - 8.3 g/dL 7.2 - 7.8  Albumin 3.5 - 5.2 g/dL 4.2 - 4.4  AST 0 - 37 U/L '22 22 20  ' ALT 0 - 53 U/L '16 20 15  ' Alk Phosphatase 39 - 117 U/L 73 - 60  Total Bilirubin 0.2 - 1.2 mg/dL 0.7 - 0.7     The ASCVD Risk score (GofBrenhamet al., 2013) failed to calculate for the following reasons:   The  2013 ASCVD risk score is only valid for ages 40 18 79 38Patient has failed these meds in past: None noted  Patient is currently controlled, except for TRIG on the following medications:  . Atorvastatin 8m daily  . CoQ10 2062mdaily  Fill Dates Per Dispensing Hx - Appropriately filled Atorvastatin (07/05/19 90DS, 04/15/19 90DS)  We discussed:  LDL and TRIG goals.    He states he experiences cramping while on atorvastatin 1019mnd wonders what other options are available. Can consider changing to rosuvastatin as it is more hydrophilic and could possibly cause less muscle cramping. He states that the coq10 has not assisted in reducing cramping. Discussed that CoQ10 may not be beneficial and he could D/C this medication. He requests that if his statin is changed to rosuvastatin that it is sent to [facility].  Update 09/28/19 Plans to take Co10 until it runs out and then D/C Takes Tu/Th/Sat Excited to start back playing golf once it stops raining. Plays golf about 3 times per week.  Update 11/02/19 Evaluate at next visit  Plan -Consider discontinuation of CoQ10 -Consider changing atorvastatin 46m81m rosuvastatin 5mg 23mly  GERD   Patient has failed these meds in past: None noted  Patient is currently controlled on the following medications:  . Omeprazole 20mg 46my  Patient attempted to D/C omeprazole in the past. Does not recall if he tapered his dose or not.  Patient interested in tapering off of PPI.  We discussed:  the risk/benefit of continuation vs discontinuation of long term PPI use  We discussed tapering over a 5 week course.  Update 09/28/19 Started taking every other Hallelujah Wysong.  Would like to continue doing this for a couple of weeks and then move to taking every 3rd Koran Seabrook. Agreeable to this plan.  Update 11/02/19 How often taking omeprazole? Taking every 3rd Domonique Cothran and has not experienced any side effects  Plan -Continue PPI taper and supplement with pepcid if needed (pt would  like to receive a script for pepcid if needed as this might be covered better under insurance)   Chronic Constipation    Patient has failed these meds in past: None noted  Patient is currently controlled on the following medications: . Lactulose 10g/15mL 443m (32m daily . Miralax 17g daily (was taking every 3rd Milagro Belmares)  Pt did not notice a difference in constipation since starting the probiotic. Wonders if he can D/C.   We discussed:  that if the probiotic did not change his bowel habits and that was his main reason for taking it then he could D/C   Update 09/28/19 Did stop the probiotic and did not experience any change in his bowel habits. Started taking miralax daily and developed diarrhea, so he reduced his dosing of miralax  Update 11/02/19 Evaluate at next visit How often taking miralax? How often taking lactulose?  Plan -Continue current medications  Vaccines   Reviewed and discussed patient's vaccination history.  Pt is up to date on vaccines.  Immunization History  Administered Date(s) Administered  . Influenza Split 10/30/2010, 10/29/2011  . Influenza Whole 11/11/2006, 11/17/2007, 10/25/2008, 10/30/2009  . Influenza, High Dose Seasonal PF 10/18/2015  . Influenza,inj,Quad PF,6+ Mos 10/20/2012, 10/19/2013, 10/11/2014, 09/08/2018  . Influenza-Unspecified 09/17/2016, 10/27/2017  . PFIZER SARS-COV-2 Vaccination 02/09/2019, 03/01/2019, 10/24/2019  . Pneumococcal Conjugate-13 09/07/2013  . Pneumococcal Polysaccharide-23 05/05/2001, 10/15/2007, 08/06/2017  . Td 03/29/2001  . Tdap 04/16/2011  . Zoster 03/10/2007  . Zoster Recombinat (Shingrix) 09/17/2016, 01/26/2017   Update 09/28/19 [facility] States pharmacist informed him this information would be sent to the clinic.  Miscellaneous Meds Patient interested in decreasing polypharmacy. Would like to get all of his medications filled through  [facility] Multivitamin (discussed that he could D/C this if he felt he had a balanced diet) Aspirin (states he stomach felt better when he D/C aspirin)  Follow up:  3 month phone visit   De Blanch, PharmD Clinical Pharmacist [facility] at [facility] [phone]

## 2019-11-15 ENCOUNTER — Other Ambulatory Visit: Payer: Self-pay | Admitting: Internal Medicine

## 2019-11-26 NOTE — Patient Instructions (Signed)
Visit Information  Goals Addressed            This Visit's Progress    Chronic Care Management Pharmacy Care Plan       CARE PLAN ENTRY (see longitudinal plan of care for additional care plan information)  Current Barriers:   Chronic Disease Management support, education, and care coordination needs related to Hypertension, Hyperlipidemia, GERD, Chronic Constipation   Hypertension BP Readings from Last 3 Encounters:  09/19/19 (!) 135/57  08/29/19 (!) 132/77  02/28/19 (!) 137/58    Pharmacist Clinical Goal(s): o Over the next 90 days, patient will work with PharmD and providers to maintain BP goal <140/90  Current regimen:   Amlodipine 5mg  daily  HCTZ 12.5mg  daily   Metoprolol succinate 25mg  daily   Interventions: o Discussed BP med options  Patient self care activities - Over the next 90 days, patient will: o Check BP 2-3 times per week, document, and provide at future appointments o Ensure daily salt intake < 2300 mg/Michael Johns  Hyperlipidemia Lab Results  Component Value Date/Time   LDLCALC 88 02/28/2019 08:20 AM   LDLDIRECT 150.2 04/16/2011 09:11 AM    Pharmacist Clinical Goal(s): o Over the next 90 days, patient will work with PharmD and providers to maintain LDL goal < 100  Current regimen:   Atorvastatin 10mg  daily   CoQ10 200mg  daily  Interventions: o Consider D/C CoQ10 if not providing benefit o Collaboration with provider regarding medication management (changing atorvastatin 10mg  to rosuvastatin 5mg  to see if this helps with muscle ache)  Patient self care activities - Over the next 90 days, patient will: o Maintain cholesterol medication regimen.   GERD  Pharmacist Clinical Goal(s) o Over the next 90 days, patient will work with PharmD and providers to reduce symptoms of GERD and polypharmacy  Current regimen:  o Omeprazole 20mg  every other Michael Johns  Interventions: o Discussed tapering plan  Patient self care activities - Over the next 90  days, patient will: o Continue tapering plan noting that if he has symptoms he can supplement with famotidine (generic)/Pepcid (name brand)   Chronic Constipation  Pharmacist Clinical Goal(s) o Over the next 90 days, patient will work with PharmD and providers to reduce symptoms of constipation  Current regimen:   Lactulose 10g/58mL 1mLs (30g) daily  Miralax 17g daily  Interventions: o We discussed D/C probiotic and initial visit. Pt has tolerated stopping probiotic  Patient self care activities - Over the next 90 days, patient will: o Maintain constipation medication regimen   Medication management  Pharmacist Clinical Goal(s): o Over the next 90 days, patient will work with PharmD and providers to maintain optimal medication adherence  Current pharmacy: [facility]  Interventions o Comprehensive medication review performed. o Continue current medication management strategy  Patient self care activities - Over the next 90 days, patient will: o Focus on medication adherence by filling and taking medications appropriately  o Take medications as prescribed o Report any questions or concerns to PharmD and/or provider(s)  Please see past updates related to this goal by clicking on the "Past Updates" button in the selected goal         Patient verbalizes understanding of instructions provided today.   Telephone follow up appointment with pharmacy team member scheduled for: 02/02/2020  Michael Johns, PharmD Clinical Pharmacist [facility] at [facility] [phone]

## 2019-12-06 ENCOUNTER — Telehealth: Payer: Self-pay | Admitting: Pharmacist

## 2019-12-06 NOTE — Progress Notes (Addendum)
    Chronic Care Management Pharmacy Assistant   Name: Michael Johns  MRN: [id number] DOB: Jan 31, 1932  Reason for Encounter: Medication Review  Patient Questions:  1.  Have you seen any other providers since your last visit? No  2.  Any changes in your medicines or health? No  PCP : Colon Branch, MD  Allergies:   Allergies  Allergen Reactions   Cephalexin Hives    Medications: Outpatient Encounter Medications as of 12/06/2019  Medication Sig   metoprolol succinate (TOPROL-XL) 25 MG 24 hr tablet Take 1 tablet (25 mg total) by mouth daily.   amLODipine (NORVASC) 5 MG tablet Take 1 tablet (5 mg total) by mouth daily.   atorvastatin (LIPITOR) 10 MG tablet Take 1 tablet (10 mg total) by mouth daily.   Co-Enzyme Q10 200 MG CAPS Take 1 capsule by mouth daily.   hydrochlorothiazide (HYDRODIURIL) 12.5 MG tablet Take 1 tablet (12.5 mg total) by mouth daily.   lactulose, encephalopathy, (ENULOSE) 10 GM/15ML SOLN Take 45 mLs (30 g total) by mouth daily as needed.   Multiple Vitamin (MULTIVITAMIN) tablet Take 1 tablet by mouth daily.     omeprazole (PRILOSEC) 20 MG capsule Take 1 capsule (20 mg total) by mouth daily.   polyethylene glycol (MIRALAX / GLYCOLAX) packet Take 17 g by mouth as needed. Reported on 06/15/2015   Probiotic Product (PROBIOTIC-10 PO) Take 1 capsule by mouth daily.   No facility-administered encounter medications on file as of 12/06/2019.    Current Diagnosis: Patient Active Problem List   Diagnosis Date Noted   GERD (gastroesophageal reflux disease) 02/28/2019   Melanoma of skin ([place]) 04/14/2018   Atypical chest pain 04/01/2015   PCP NOTES >>>>>>>>>>>>>>>>>>>> 11/09/2014   DJD (degenerative joint disease) 05/06/2013   Dysphagia 04/23/2012   Annual physical exam 04/16/2011   Chronic constipation 07/22/2010   Hyperlipidemia 07/18/2009   ADENOCARCINOMA, PROSTATE 07/01/2006   PROSTATECTOMY, HX OF 07/01/2006   Essential hypertension 05/19/2006    Goals Addressed     None    Called patient and discussed medications, currently no problems.  Reports taking Omeprazole every third day for a week now. Reports taking Miralax as needed  Reports taking lactulose as need Reports bowel habits is good when he takes the miralax and lactulose and works within two hours   Patient denies any recent ED visits. Patient denies any side effects with current medications. Patient denies any problems with current pharmacy.  States that CPP was going to let him know about the decision to change blood pressure medication to combo medications. Reports blood pressure readings: 11-07-19 at 11:14 am 136/71 56 12-07-19 at 8 am   134/83 64  Follow-Up:  Pharmacist Review   Michael Johns, [facility] [place] [phone]  Sent recommendations to PCP who might prefer to discuss during next office visit.  Reviewed by: De Blanch, PharmD Clinical Pharmacist [facility] at [facility] [phone]

## 2019-12-21 NOTE — Telephone Encounter (Addendum)
Melvenia Beam,  I'll be happy to discuss at the next visit or sooner if the patient so desires.  Hey Dr. Larose Kells!  This patient and I discussed some med options for his hypertension medication regimen.  I realize you may prefer to discuss these at his next follow up in February, but I just wanted to present a few options for your consideration.   Patient is interested in simplifying his medication regimen. He asks about taking lisinopril noting his wife takes it in a combo pill reducing the number of pill she has to take.  We thoroughly discussed possible medication regimens for patient noting the pros/cons of each regimen.    Med Options  Lisinopril/hctz combo to replace amlodipine and hctz  Pros: Reduces pill burden, ACEI risk of hyperkalemia balanced by hctz  Cons: Continuing hctz could continue low/boderline low Chloride and Sodium    Benazepril/amlodipine combo to replace amlodipine and hctz  Pros: Might reduce risk of hypochloremia and hyponatremia noting pt's values have been low  Cons: Risk of hyperkalemia without hctz    Eventual taper off of metoprolol once BP stable (no compelling indication for beta blocker, but note that patient has taken this for years and may not tolerate taper)   Please let me know if I can help with anything further!  De Blanch, PharmD  Clinical Pharmacist  [facility] at [facility]  [phone]

## 2020-02-02 ENCOUNTER — Ambulatory Visit: Payer: Medicare Other | Admitting: Pharmacist

## 2020-02-02 DIAGNOSIS — I10 Essential (primary) hypertension: Secondary | ICD-10-CM

## 2020-02-02 DIAGNOSIS — E785 Hyperlipidemia, unspecified: Secondary | ICD-10-CM

## 2020-02-02 NOTE — Chronic Care Management (AMB) (Signed)
Chronic Care Management Pharmacy  Name: Michael Johns  MRN: [id number] DOB: Jan 14, 1933  Chief Complaint/ HPI  Michael Johns,  87 y.o. , male presents for their Follow-Up CCM visit with the clinical pharmacist via telephone.  PCP : Michael Branch, MD  Their chronic conditions include: Hypertension, Hyperlipidemia, GERD, Chronic Constipation  Office Visits: None since last CCM visit on 11/02/2019.   Consult Visit: None since last CCM visit on 11/02/2019.   Medications: Outpatient Encounter Medications as of 02/02/2020  Medication Sig  . metoprolol succinate (TOPROL-XL) 25 MG 24 hr tablet Take 1 tablet (25 mg total) by mouth daily.  Marland Kitchen amLODipine (NORVASC) 5 MG tablet Take 1 tablet (5 mg total) by mouth daily.  Marland Kitchen atorvastatin (LIPITOR) 10 MG tablet Take 1 tablet (10 mg total) by mouth daily.  . hydrochlorothiazide (HYDRODIURIL) 12.5 MG tablet Take 1 tablet (12.5 mg total) by mouth daily.  Marland Kitchen lactulose, encephalopathy, (ENULOSE) 10 GM/15ML SOLN Take 45 mLs (30 g total) by mouth daily as needed.  . Multiple Vitamin (MULTIVITAMIN) tablet Take 1 tablet by mouth daily.    Marland Kitchen omeprazole (PRILOSEC) 20 MG capsule Take 1 capsule (20 mg total) by mouth daily.  . polyethylene glycol (MIRALAX / GLYCOLAX) packet Take 17 g by mouth as needed. Reported on 06/15/2015  . [DISCONTINUED] Co-Enzyme Q10 200 MG CAPS Take 1 capsule by mouth daily.  . [DISCONTINUED] Probiotic Product (PROBIOTIC-10 PO) Take 1 capsule by mouth daily.   No facility-administered encounter medications on file as of 02/02/2020.   SDOH Screenings   Alcohol Screen: Not on file  Depression (PHQ2-9): Low Risk   . PHQ-2 Score: 0  Financial Resource Strain: Low Risk   . Difficulty of Paying Living Expenses: Not very hard  Food Insecurity: Not on file  Housing: Not on file  Physical Activity: Insufficiently Active  . Days of Exercise per Week: 3 days  . Minutes of Exercise per Session: 40 min  Social Connections: Not on file  Stress:  Not on file  Tobacco Use: Low Risk   . Smoking Tobacco Use: Never Smoker  . Smokeless Tobacco Use: Never Used  Transportation Needs: Not on file     Current Diagnosis/Assessment:  Goals Addressed            This Visit's Progress   . Chronic Care Management Pharmacy Care Plan       CARE PLAN ENTRY (see longitudinal plan of care for additional care plan information)  Current Barriers:  . Chronic Disease Management support, education, and care coordination needs related to Hypertension, Hyperlipidemia, GERD, Chronic Constipation   Hypertension BP Readings from Last 3 Encounters:  09/19/19 (!) 135/57  08/29/19 (!) 132/77  02/28/19 (!) 137/58   . Pharmacist Clinical Goal(s): o Over the next 90 days, patient will work with PharmD and providers to maintain BP goal <140/90 . Current regimen:  . Amlodipine 28m daily . HCTZ 12.518mdaily  . Metoprolol succinate 2542maily  . Interventions: o Discussed BP med options . Patient self care activities - Over the next 90 days, patient will: o Check BP 2-3 times per week, document, and provide at future appointments o Ensure daily salt intake < 2300 mg/Michael Johns  Hyperlipidemia Lab Results  Component Value Date/Time   LDLCALC 88 02/28/2019 08:20 AM   LDLDIRECT 150.2 04/16/2011 09:11 AM   . Pharmacist Clinical Goal(s): o Over the next 90 days, patient will work with PharmD and providers to maintain LDL goal < 100 . Current regimen:  .  Atorvastatin 67m daily  . Interventions: o Collaboration with provider regarding medication management (changing atorvastatin 124mto rosuvastatin 88m2mo see if this helps with muscle ache) . Patient self care activities - Over the next 90 days, patient will: o Maintain cholesterol medication regimen.   GERD . Pharmacist Clinical Goal(s) o Over the next 90 days, patient will work with PharmD and providers to reduce symptoms of GERD and polypharmacy . Current regimen:  o Omeprazole 49m9mery other  Michael Johns . Interventions: o Discussed tapering plan . Patient self care activities - Over the next 90 days, patient will: o Continue tapering plan noting that if he has symptoms he can supplement with famotidine (generic)/Pepcid (name brand)   Chronic Constipation . Pharmacist Clinical Goal(s) o Over the next 90 days, patient will work with PharmD and providers to reduce symptoms of constipation . Current regimen:  . Lactulose 10g/188mL60mLs48mg) daily . Miralax 17g daily . Interventions: o We discussed D/C probiotic and initial visit. Pt has tolerated stopping probiotic o Discussed trial of every other Michael Johns miralax . Patient self care activities - Over the next 90 days, patient will: o Maintain constipation medication regimen   Medication management . Pharmacist Clinical Goal(s): o Over the next 90 days, patient will work with PharmD and providers to maintain optimal medication adherence . [facility] o Comprehensive medication review performed. o Continue current medication management strategy . Patient self care activities - Over the next 90 days, patient will: o Focus on medication adherence by filling and taking medications appropriately  o Take medications as prescribed o Report any questions or concerns to PharmD and/or provider(s)  Please see past updates related to this goal by clicking on the "Past Updates" button in the selected goal         Hypertension   BP goal is:  <140/90  Office blood pressures are  BP Readings from Last 3 Encounters:  09/19/19 (!) 135/57  08/29/19 (!) 132/77  02/28/19 (!) 137/58   CMP Latest Ref Rng & Units 08/29/2019 02/28/2019 08/25/2018  Glucose 70 - 99 mg/dL 92 92 100(H)  BUN 6 - 23 mg/dL '16 15 15  ' Creatinine 0.40 - 1.50 mg/dL 1.11 1.12 1.25  Sodium 135 - 145 mEq/L 131(L) 131(L) 134(L)  Potassium 3.5 - 5.1 mEq/L 4.7 4.6 4.4  Chloride 96 - 112 mEq/L 95(L) 93(L) 97  CO2 19 - 32 mEq/L '29  30 31  ' Calcium 8.4 - 10.5 mg/dL 9.5 9.7 9.8  Total Protein 6.0 - 8.3 g/dL - 7.2 -  Total Bilirubin 0.2 - 1.2 mg/dL - 0.7 -  Alkaline Phos 39 - 117 U/L - 73 -  AST 0 - 37 U/L - 22 -  ALT 0 - 53 U/L - 16 -   Kidney Function Lab Results  Component Value Date/Time   CREATININE 1.11 08/29/2019 08:27 AM   CREATININE 1.12 02/28/2019 08:20 AM   GFR 62.91 08/29/2019 08:27 AM   GFRNONAA 77.48 01/17/2009 09:09 AM   GFRAA  12/15/2007 09:00 AM    >60        The eGFR has been calculated using the MDRD equation. This calculation has not been validated in all clinical   K 4.7 08/29/2019 08:27 AM   K 4.6 02/28/2019 08:20 AM   Patient checks BP at home daily  Patient has failed these meds in the past: None noted  Patient is currently controlled on the following medications:  . Amlodipine 88mg59m  daily . HCTZ 12.87m daily  . Metoprolol succinate 267mdaily   Fill Dates Per Dispensing Hx: Appropriately filled Amlodipine (07/05/19 90DS, 04/15/19 90DS) HCTZ (07/05/19 90DS, 04/15/19 90DS) Metoprolol (09/01/19 90DS, 06/17/19 90DS, 04/15/19 90DS)  Patient is interested in simplifying his medication regimen. He asks about taking lisinopril noting his wife takes it in a combo pill reducing the number of pill she has to take.  We thoroughly discussed possible medication regimens for patient noting the pros/cons of each regimen.   Med Options Lisinopril/hctz combo to replace amlodipine and hctz Pros: Reduces pill burden, ACEI risk of hyperkalemia balanced by hctz Cons: Continuing hctz could continue low/boderline low Chloride and Sodium  Benazepril/amlodipine combo to replace amlodipine and hctz Pros: Might reduce risk of hypochloremia and hyponatremia noting pt's values have been low Cons: Risk of hyperkalemia without hctz  Eventual taper off of metoprolol once BP stable (no compelling indication for beta blocker, but note that patient has taken this for years and may not tolerate taper)  Will discuss  options with Dr. PaLarose KellsWe discussed Med options noting pros and cons, BP goal   Update 09/28/19 Still consulting with Dr. PaLarose Kellsbout med changes.  Patient Reported BP '120 70 71 139 65 56 129 66 63 138 ' 65 68 114 70 58 126 65 59 Average 127.6 66.8 62.5  Update 11/02/19 States he has been checking his BP regularly and they have remained within range. He would like to simplify his regimen, but is ok to continue if that is what Dr. PaLarose Kellsrefers. He is at the store right now. Asks that I call back when he gets home to obtain specific blood pressure readings.  Update 02/02/20 Dr. PaLarose Kellsware of med options and patient can discuss with him at next visit on 03/02/20  Plan -Continue current medications  -Check BP 2-3 times per week and record    Hyperlipidemia   LDL goal < 100  Lipid Panel     Component Value Date/Time   CHOL 171 02/28/2019 0820   TRIG 163.0 (H) 02/28/2019 0820   HDL 50.20 02/28/2019 0820   LDLCALC 88 02/28/2019 0820   LDLDIRECT 150.2 04/16/2011 0911    Hepatic Function Latest Ref Rng & Units 02/28/2019 02/09/2018 02/04/2017  Total Protein 6.0 - 8.3 g/dL 7.2 - 7.8  Albumin 3.5 - 5.2 g/dL 4.2 - 4.4  AST 0 - 37 U/L '22 22 20  ' ALT 0 - 53 U/L '16 20 15  ' Alk Phosphatase 39 - 117 U/L 73 - 60  Total Bilirubin 0.2 - 1.2 mg/dL 0.7 - 0.7     The ASCVD Risk score ([place] et al., 2013) failed to calculate for the following reasons:   The 2013 ASCVD risk score is only valid for ages 4045o 7935 Patient has failed these meds in past: None noted  Patient is currently controlled, except for TRIG on the following medications:  . Atorvastatin 1089maily   Fill Dates Per Dispensing Hx - Appropriately filled Atorvastatin (07/05/19 90DS, 04/15/19 90DS)  We discussed:  LDL and TRIG goals.    He states he experiences cramping while on atorvastatin 86m70md wonders what other options are available. Can consider changing to rosuvastatin as it is more hydrophilic and could possibly cause  less muscle cramping. He states that the coq10 has not assisted in reducing cramping. Discussed that CoQ10 may not be beneficial and he could D/C this medication. He requests that if his statin is changed to  rosuvastatin that [facility] is [facility].  Update 09/28/19 Plans to take Co10 until it runs out and then D/C Takes Tu/Th/Sat Excited to start back playing golf once it stops raining. Plays golf about 3 times per week.  Update 11/02/19 Evaluate at next visit  Update 02/02/20 Still having cramping? Minimally, about once a week, wonders if it is due to dehydration. He realizes he may not drink enough Stop CoQ10? Yes Discuss medication change after visit with Dr. Larose Kells and labs.   Plan -Consider changing atorvastatin 69m to rosuvastatin 558mdaily at next office visit  GERD   Patient has failed these meds in past: None noted  Patient is currently controlled on the following medications:  . Omeprazole 2050maily  Patient attempted to D/C omeprazole in the past. Does not recall if he tapered his dose or not.  Patient interested in tapering off of PPI.  We discussed:  the risk/benefit of continuation vs discontinuation of long term PPI use  We discussed tapering over a 5 week course.  Update 09/28/19 Started taking every other Marlissa Emerick.  Would like to continue doing this for a couple of weeks and then move to taking every 3rd Dixon Luczak. Agreeable to this plan.  Update 11/02/19 How often taking omeprazole? Taking every 3rd Katricia Prehn and has not experienced any side effects  Update 02/02/20 How often taking omeprazole? Taking every other Edson Deridder, tried every 3rd Maciah Feeback and would get acid reflux   Plan -Continue current management   Chronic Constipation    Patient has failed these meds in past: None noted  Patient is currently controlled on the following medications: . Lactulose 10g/53m51mmL22m0g) daily (taking every 3rd Tyeson Tanimoto) . Miralax 17g daily   Pt did not notice a difference in constipation since  starting the probiotic. Wonders if he can D/C.   We discussed:  that if the probiotic did not change his bowel habits and that was his main reason for taking it then he could D/C   Update 09/28/19 Did stop the probiotic and did not experience any change in his bowel habits. Started taking miralax daily and developed diarrhea, so he reduced his dosing of miralax  Update 02/02/20 Evaluate at next visit How often taking miralax? Rarely uses How often taking lactulose? About every 3rd Foday Cone if he hasn't had a bowel movement When he took miralax daily he was unable to control his bowels and this would limit his social interactions He is considering a trial of miralax every other Vivan Agostino   Plan -Consider trial every of other Lajuane Leatham with miralax   Miscellaneous Meds Patient interested in decreasing polypharmacy. Would like to get all of his medications filled through [facility] Multivitamin (discussed that he could D/C this if he felt he had a balanced diet)  Follow up:  3 month phone visit   KanesDe BlanchrmD, BCACP Clinical Pharmacist [phone]

## 2020-02-03 NOTE — Patient Instructions (Addendum)
Visit Information  Goals Addressed            This Visit's Progress   . Chronic Care Management Pharmacy Care Plan       CARE PLAN ENTRY (see longitudinal plan of care for additional care plan information)  Current Barriers:  . Chronic Disease Management support, education, and care coordination needs related to Hypertension, Hyperlipidemia, GERD, Chronic Constipation   Hypertension BP Readings from Last 3 Encounters:  09/19/19 (!) 135/57  08/29/19 (!) 132/77  02/28/19 (!) 137/58   . Pharmacist Clinical Goal(s): o Over the next 90 days, patient will work with PharmD and providers to maintain BP goal <140/90 . Current regimen:  . Amlodipine 5mg  daily . HCTZ 12.5mg  daily  . Metoprolol succinate 25mg  daily  . Interventions: o Discussed BP med options . Patient self care activities - Over the next 90 days, patient will: o Check BP 2-3 times per week, document, and provide at future appointments o Ensure daily salt intake < 2300 mg/Christl Fessenden  Hyperlipidemia Lab Results  Component Value Date/Time   LDLCALC 88 02/28/2019 08:20 AM   LDLDIRECT 150.2 04/16/2011 09:11 AM   . Pharmacist Clinical Goal(s): o Over the next 90 days, patient will work with PharmD and providers to maintain LDL goal < 100 . Current regimen:  . Atorvastatin 10mg  daily  . Interventions: o Collaboration with provider regarding medication management (changing atorvastatin 10mg  to rosuvastatin 5mg  to see if this helps with muscle ache) . Patient self care activities - Over the next 90 days, patient will: o Maintain cholesterol medication regimen.   GERD . Pharmacist Clinical Goal(s) o Over the next 90 days, patient will work with PharmD and providers to reduce symptoms of GERD and polypharmacy . Current regimen:  o Omeprazole 20mg  every other Celestial Barnfield . Interventions: o Discussed tapering plan . Patient self care activities - Over the next 90 days, patient will: o Continue tapering plan noting that if he has  symptoms he can supplement with famotidine (generic)/Pepcid (name brand)   Chronic Constipation . Pharmacist Clinical Goal(s) o Over the next 90 days, patient will work with PharmD and providers to reduce symptoms of constipation . Current regimen:  . Lactulose 10g/38mL 66mLs (30g) daily . Miralax 17g daily . Interventions: o We discussed D/C probiotic and initial visit. Pt has tolerated stopping probiotic o Discussed trial of every other Jadynn Epping miralax . Patient self care activities - Over the next 90 days, patient will: o Maintain constipation medication regimen   Medication management . Pharmacist Clinical Goal(s): o Over the next 90 days, patient will work with PharmD and providers to maintain optimal medication adherence . [facility] . Interventions o Comprehensive medication review performed. o Continue current medication management strategy . Patient self care activities - Over the next 90 days, patient will: o Focus on medication adherence by filling and taking medications appropriately  o Take medications as prescribed o Report any questions or concerns to PharmD and/or provider(s)  Please see past updates related to this goal by clicking on the "Past Updates" button in the selected goal         The patient verbalized understanding of instructions, educational materials, and care plan provided today and agreed to receive a mailed copy of patient instructions, educational materials, and care plan.   Telephone follow up appointment with pharmacy team member scheduled for: 05/02/2020  Melvenia Beam Tawona Filsinger, [facility]

## 2020-03-02 ENCOUNTER — Ambulatory Visit: Payer: Medicare Other | Admitting: Internal Medicine

## 2020-03-06 ENCOUNTER — Ambulatory Visit (INDEPENDENT_AMBULATORY_CARE_PROVIDER_SITE_OTHER): Payer: Medicare Other | Admitting: Internal Medicine

## 2020-03-06 ENCOUNTER — Encounter: Payer: Self-pay | Admitting: Internal Medicine

## 2020-03-06 ENCOUNTER — Other Ambulatory Visit: Payer: Self-pay

## 2020-03-06 VITALS — BP 145/80 | HR 61 | Temp 97.6°F | Resp 18 | Ht 64.0 in | Wt 149.5 lb

## 2020-03-06 DIAGNOSIS — R7989 Other specified abnormal findings of blood chemistry: Secondary | ICD-10-CM | POA: Diagnosis not present

## 2020-03-06 DIAGNOSIS — I10 Essential (primary) hypertension: Secondary | ICD-10-CM

## 2020-03-06 DIAGNOSIS — E785 Hyperlipidemia, unspecified: Secondary | ICD-10-CM

## 2020-03-06 LAB — COMPREHENSIVE METABOLIC PANEL
ALT: 18 U/L (ref 0–53)
AST: 22 U/L (ref 0–37)
Albumin: 4.4 g/dL (ref 3.5–5.2)
Alkaline Phosphatase: 65 U/L (ref 39–117)
BUN: 22 mg/dL (ref 6–23)
CO2: 32 mEq/L (ref 19–32)
Calcium: 10.2 mg/dL (ref 8.4–10.5)
Chloride: 96 mEq/L (ref 96–112)
Creatinine, Ser: 1.26 mg/dL (ref 0.40–1.50)
GFR: 51.87 mL/min — ABNORMAL LOW (ref >60.00)
Glucose, Bld: 80 mg/dL (ref 70–99)
Potassium: 5.1 mEq/L (ref 3.5–5.1)
Sodium: 133 mEq/L — ABNORMAL LOW (ref 135–145)
Total Bilirubin: 0.6 mg/dL (ref 0.2–1.2)
Total Protein: 7.6 g/dL (ref 6.0–8.3)

## 2020-03-06 LAB — T4, FREE: Free T4: 0.72 ng/dL (ref 0.60–1.60)

## 2020-03-06 LAB — TSH: TSH: 3.48 u[IU]/mL (ref 0.35–4.50)

## 2020-03-06 LAB — LIPID PANEL
Cholesterol: 161 mg/dL (ref 0–200)
HDL: 51.2 mg/dL (ref >39.00)
NonHDL: 110.01
Total CHOL/HDL Ratio: 3
Triglycerides: 250 mg/dL — ABNORMAL HIGH (ref 0.0–149.0)
VLDL: 50 mg/dL — ABNORMAL HIGH (ref 0.0–40.0)

## 2020-03-06 LAB — LDL CHOLESTEROL, DIRECT: Direct LDL: 82 mg/dL

## 2020-03-06 NOTE — Patient Instructions (Signed)
It was good to see you today, please continue checking your blood pressures  GO TO THE LAB : Get the blood work     GO TO THE FRONT DESK, Keyes back for a physical exam in 6 months

## 2020-03-06 NOTE — Progress Notes (Unsigned)
Pre visit review using our clinic review tool, if applicable. No additional management support is needed unless otherwise documented below in the visit note. 

## 2020-03-06 NOTE — Progress Notes (Signed)
Subjective:    Patient ID: Michael Johns, male    DOB: November 11, 1932, 87 y.o.   MRN: [id number]  DOS:  03/06/2020 Type of visit - description: Routine follow-up  Since the last office visit is feeling well. He remains active playing golf.   Review of Systems Denies chest pain or difficulty breathing No nausea, vomiting, diarrhea.  No constipation.  Past Medical History:  Diagnosis Date  . Abdominal pain 11/09   due to constipation and ?pancreatitis- admitted  . ADENOCARCINOMA, PROSTATE 07/01/2006  . Atypical chest pain 04/01/2015  . Constipation    chronic, impaction ~2012 (admited) and 01-2015  . GERD (gastroesophageal reflux disease)   . Gout    see 09-2008 note  . Hearing decreased    right, 40% x years  . [place], MILD 07/18/2009  . HYPERTENSION 05/19/2006  . Melanoma [facility])     Past Surgical History:  Procedure Laterality Date  . APPENDECTOMY    . cataracts Bilateral 2014   lens implantinfo scanned (02-09-17)  . INGUINAL HERNIA REPAIR Right 11/2007  . MELANOMA EXCISION Right 05/12/2018   R leg melanoma. Dr  Winifred Olive at the [facility] in [place].  Marland Kitchen POLYPECTOMY    . PROSTATECTOMY  05/2007   LADS (-) Bone scan (-)  . Rt middle ear surgery    . sinus polyp removed    . TONSILLECTOMY      Allergies as of 03/06/2020      Reactions   Cephalexin Hives      Medication List       Accurate as of March 06, 2020 11:59 PM. If you have any questions, ask your nurse or doctor.        amLODipine 5 MG tablet Commonly known as: NORVASC Take 1 tablet (5 mg total) by mouth daily.   atorvastatin 10 MG tablet Commonly known as: LIPITOR Take 1 tablet (10 mg total) by mouth daily.   hydrochlorothiazide 12.5 MG tablet Commonly known as: HYDRODIURIL Take 1 tablet (12.5 mg total) by mouth daily.   lactulose (encephalopathy) 10 GM/15ML Soln Commonly known as: Enulose Take 45 mLs (30 g total) by mouth daily as needed.   metoprolol succinate 25  MG 24 hr tablet Commonly known as: TOPROL-XL Take 1 tablet (25 mg total) by mouth daily.   multivitamin tablet Take 1 tablet by mouth daily.   omeprazole 20 MG capsule Commonly known as: PRILOSEC Take 1 capsule (20 mg total) by mouth daily.   polyethylene glycol 17 g packet Commonly known as: MIRALAX / GLYCOLAX Take 17 g by mouth as needed. Reported on 06/15/2015          Objective:   Physical Exam BP (!) 145/80 (BP Location: Left Arm, Patient Position: Sitting, Cuff Size: Small)   Pulse 61   Temp 97.6 F (36.4 C) (Oral)   Resp 18   Ht 5\' 4"  (1.626 m)   Wt 149 lb 8 oz (67.8 kg)   SpO2 100%   BMI 25.66 kg/m  General:   Well developed, NAD, BMI noted. HEENT:  Normocephalic . Face symmetric, atraumatic Lungs:  CTA B Normal respiratory effort, no intercostal retractions, no accessory muscle use. Heart: RRR,  no murmur.  Lower extremities: no pretibial edema bilaterally  Skin: Not pale. Not jaundice Neurologic:  alert & oriented X3.  Speech normal, gait appropriate for age and unassisted Psych--  Cognition and judgment appear intact.  Cooperative with normal attention span and concentration.  Behavior appropriate. No anxious  or depressed appearing.      Assessment     Assessment HTN Hyperlipidemia Leg cramps-- lipitor d/c in the past, cramps decreased, back on lipitor as cramps are  mild and benefits > risks. CoQ10 helps, CKs normal. rx robaxin 03-2015 Prostate cancer-- prostatectomy 05-2007, urology stopped f/u,  PSA checked by PCP SKIN: SCC MElanoma, R leg , excision~ July 2020 [facility] GI: GERD,  --Dysphagia-- Rx  EGD 02-2014 (declined,sx chronic, mild) --H. pylori positive, re-checked breathing test negative 06-2015 --Chronic mild constipation, fecal impaction 2012 (admited per pt) and 01-2015 (ER): on MiraLAX qd, senokot or lactulose prn HOH   CP- saw cards, (-) stress test (EET) 10-2015   PLAN: HTN: Continue amlodipine, HCTZ, metoprolol.  Ambulatory BP on  average 134/65 HR: 64. Check CMP High cholesterol: On Lipitor, not fasting, check a lipid panel Slight increased TSH: Check TSH and free T4.  No convincing symptoms of hypothyroidism. Preventive care: Update on no shots RTC 6 months CPX   This visit occurred during the SARS-CoV-2 public health emergency.  Safety protocols were in place, including screening questions prior to the visit, additional usage of staff PPE, and extensive cleaning of exam room while observing appropriate contact time as indicated for disinfecting solutions.

## 2020-03-07 NOTE — Assessment & Plan Note (Signed)
HTN: Continue amlodipine, HCTZ, metoprolol.  Ambulatory BP on average 134/65 HR: 64. Check CMP High cholesterol: On Lipitor, not fasting, check a lipid panel Slight increased TSH: Check TSH and free T4.  No convincing symptoms of hypothyroidism. Preventive care: Update on no shots RTC 6 months CPX

## 2020-03-20 ENCOUNTER — Encounter: Payer: Self-pay | Admitting: Internal Medicine

## 2020-03-30 ENCOUNTER — Telehealth: Payer: Self-pay | Admitting: Pharmacist

## 2020-03-30 NOTE — Progress Notes (Addendum)
° ° °Chronic Care Management °Pharmacy Assistant  ° °Name: Michael Johns  MRN: [id number] DOB: 12/14/1932 ° °Reason for Encounter: Disease State For HTN ° °Patient Questions: ° 1.  Have you seen any other providers since your last visit? No. ° ° 2.  Any changes in your medicines or health? No. ° °PCP : Paz, Jose E, MD  ° °Their chronic conditions include: Hypertension, Hyperlipidemia, GERD, Chronic Constipation ° °Office Visits: °03/06/20 Dr. Paz. Routine follow up. Labs drawn. No medication changes. ° °Consults: None since 02/02/20 ° °Allergies:   °Allergies  °Allergen Reactions  ° Cephalexin Hives  ° ° °Medications: °Outpatient Encounter Medications as of 03/30/2020  °Medication Sig  ° amLODipine (NORVASC) 5 MG tablet Take 1 tablet (5 mg total) by mouth daily.  ° atorvastatin (LIPITOR) 10 MG tablet Take 1 tablet (10 mg total) by mouth daily.  ° hydrochlorothiazide (HYDRODIURIL) 12.5 MG tablet Take 1 tablet (12.5 mg total) by mouth daily.  ° lactulose, encephalopathy, (ENULOSE) 10 GM/15ML SOLN Take 45 mLs (30 g total) by mouth daily as needed.  ° metoprolol succinate (TOPROL-XL) 25 MG 24 hr tablet Take 1 tablet (25 mg total) by mouth daily.  ° Multiple Vitamin (MULTIVITAMIN) tablet Take 1 tablet by mouth daily.  ° omeprazole (PRILOSEC) 20 MG capsule Take 1 capsule (20 mg total) by mouth daily.  ° polyethylene glycol (MIRALAX / GLYCOLAX) packet Take 17 g by mouth as needed. Reported on 06/15/2015  ° °No facility-administered encounter medications on file as of 03/30/2020.  ° ° °Current Diagnosis: °Patient Active Problem List  ° Diagnosis Date Noted  ° GERD (gastroesophageal reflux disease) 02/28/2019  ° Melanoma of skin (HCC) 04/14/2018  ° Atypical chest pain 04/01/2015  ° PCP NOTES >>>>>>>>>>>>>>>>>>>> 11/09/2014  ° DJD (degenerative joint disease) 05/06/2013  ° Dysphagia 04/23/2012  ° Annual physical exam 04/16/2011  ° Chronic constipation 07/22/2010  ° Hyperlipidemia 07/18/2009  ° ADENOCARCINOMA, PROSTATE 07/01/2006  °  PROSTATECTOMY, HX OF 07/01/2006  ° Essential hypertension 05/19/2006  ° ° °Goals Addressed   °None °  ° °Reviewed chart prior to disease state call. Spoke with patient regarding BP ° °Recent Office Vitals: °BP Readings from Last 3 Encounters:  °03/06/20 (!) 145/80  °09/19/19 (!) 135/57  °08/29/19 (!) 132/77  ° °Pulse Readings from Last 3 Encounters:  °03/06/20 61  °09/19/19 61  °08/29/19 60  °  °Wt Readings from Last 3 Encounters:  °03/06/20 149 lb 8 oz (67.8 kg)  °09/19/19 148 lb 6.4 oz (67.3 kg)  °08/29/19 152 lb 2 oz (69 kg)  °  ° °Kidney Function °Lab Results  °Component Value Date/Time  ° CREATININE 1.26 03/06/2020 01:57 PM  ° CREATININE 1.11 08/29/2019 08:27 AM  ° GFR 51.87 (L) 03/06/2020 01:57 PM  ° GFRNONAA 77.48 01/17/2009 09:09 AM  ° GFRAA  12/15/2007 09:00 AM  °  >60        °The eGFR has been calculated °using the MDRD equation. °This calculation has not been °validated in all clinical  ° ° °BMP Latest Ref Rng & Units 03/06/2020 08/29/2019 02/28/2019  °Glucose 70 - 99 mg/dL 80 92 92  °BUN 6 - 23 mg/dL 22 16 15  °Creatinine 0.40 - 1.50 mg/dL 1.26 1.11 1.12  °Sodium 135 - 145 mEq/L 133(L) 131(L) 131(L)  °Potassium 3.5 - 5.1 mEq/L 5.1 4.7 4.6  °Chloride 96 - 112 mEq/L 96 95(L) 93(L)  °CO2 19 - 32 mEq/L 32 29 30  °Calcium 8.4 - 10.5 mg/dL 10.2 9.5 9.7  ° °  Current antihypertensive regimen:  °Amlodipine 5 mg daily °HCTZ 12.5 mg daily  °Metoprolol succinate 25 mg daily  ° °How often are you checking your Blood Pressure? 1-2x per week  ° °Current home BP readings: Patient stated he writes them down but was unable to give me any blood pressure readings at this time, He stated he would text them to me later. ° °What recent interventions/DTPs have been made by any provider to improve Blood Pressure control since last CPP Visit: None ° °Any recent hospitalizations or ED visits since last visit with CPP? Patient stated no. ° °What diet changes have been made to improve Blood Pressure Control?  °Patient stated he eats a lot  of fish, vegetables and fruits. He stated he drinks a good amount of water daily.  ° °What exercise is being done to improve your Blood Pressure Control?  °Patient stated he plays golf a couple of times a week, He stated he walks some but not everyday. ° °Adherence Review: °Is the patient currently on ACE/ARB medication? No. ° °Does the patient have >5 day gap between last estimated fill dates? No  ° °Patient stated he does not have any questions or concerns about his medication at this time. ° °Follow-Up:  Pharmacist Review  ° °Veronica Mclemore, RMA °Clinical Pharmacist Assistant °[phone] ° °5 minutes spent in review, coordination, and documentation. ° °Patient provided BP reading of 126/62 after the call with Veronica.  ° °Reviewed by: °Christian Davis, PharmD °Clinical Pharmacist °[facility] °[phone] ° °

## 2020-04-07 ENCOUNTER — Other Ambulatory Visit: Payer: Self-pay | Admitting: Internal Medicine

## 2020-05-02 ENCOUNTER — Ambulatory Visit (INDEPENDENT_AMBULATORY_CARE_PROVIDER_SITE_OTHER): Payer: Medicare Other | Admitting: Pharmacist

## 2020-05-02 DIAGNOSIS — E782 Mixed hyperlipidemia: Secondary | ICD-10-CM

## 2020-05-02 DIAGNOSIS — I10 Essential (primary) hypertension: Secondary | ICD-10-CM | POA: Diagnosis not present

## 2020-05-02 DIAGNOSIS — K5909 Other constipation: Secondary | ICD-10-CM

## 2020-05-02 DIAGNOSIS — K219 Gastro-esophageal reflux disease without esophagitis: Secondary | ICD-10-CM

## 2020-05-02 NOTE — Chronic Care Management (AMB) (Signed)
Chronic Care Management Pharmacy Note  05/02/2020  Name:  Michael Johns MRN:  [id number] DOB:  05/18/1932  Subjective: Michael Johns is an 87 y.o. year old male who is a primary patient of Paz, Alda Berthold, MD.  The CCM team was consulted for assistance with disease management and care coordination needs.    Engaged with patient by telephone for follow up visit in response to provider referral for pharmacy case management and/or care coordination services.   Consent to Services:  The patient was given information about Chronic Care Management services, agreed to services, and gave verbal consent prior to initiation of services.  Please see initial visit note for detailed documentation.   Patient Care Team: Colon Branch, MD as PCP - General Inda Castle, MD (Inactive) as Consulting Physician (Gastroenterology) Vevelyn Royals, MD as Consulting Physician (Ophthalmology) Izora Gala, MD as Consulting Physician (Otolaryngology) Cherre Robins, PharmD (Pharmacist)  Recent office visits: 03/06/2020 - PCP (Dr Larose Kells) for routine follow up; Labs checked. No medication changes.   Recent consult visits: None in last 6 months  Hospital visits: None in previous 6 months  Objective:  Lab Results  Component Value Date   CREATININE 1.26 03/06/2020   CREATININE 1.11 08/29/2019   CREATININE 1.12 02/28/2019    Lab Results  Component Value Date   HGBA1C 5.6 08/06/2017   Last diabetic Eye exam: No results found for: HMDIABEYEEXA  Last diabetic Foot exam: No results found for: HMDIABFOOTEX      Component Value Date/Time   CHOL 161 03/06/2020 1357   TRIG 250.0 (H) 03/06/2020 1357   HDL 51.20 03/06/2020 1357   CHOLHDL 3 03/06/2020 1357   VLDL 50.0 (H) 03/06/2020 1357   LDLCALC 88 02/28/2019 0820   LDLDIRECT 82.0 03/06/2020 1357    Hepatic Function Latest Ref Rng & Units 03/06/2020 02/28/2019 02/09/2018  Total Protein 6.0 - 8.3 g/dL 7.6 7.2 -  Albumin 3.5 - 5.2 g/dL 4.4 4.2 -  AST 0 -  37 U/L '22 22 22  ' ALT 0 - 53 U/L '18 16 20  ' Alk Phosphatase 39 - 117 U/L 65 73 -  Total Bilirubin 0.2 - 1.2 mg/dL 0.6 0.7 -    Lab Results  Component Value Date/Time   TSH 3.48 03/06/2020 01:57 PM   TSH 3.71 08/29/2019 08:27 AM   FREET4 0.72 03/06/2020 01:57 PM   FREET4 0.74 02/09/2018 08:16 AM    CBC Latest Ref Rng & Units 08/29/2019 08/25/2018 08/06/2017  WBC 4.0 - 10.5 K/uL 8.9 7.4 7.0  Hemoglobin 13.0 - 17.0 g/dL 13.8 14.4 14.5  Hematocrit 39.0 - 52.0 % 39.9 41.5 40.7  Platelets 150.0 - 400.0 K/uL 259.0 266.0 273.0    Lab Results  Component Value Date/Time   VD25OH 49 05/06/2013 09:22 AM    Clinical ASCVD: No  The ASCVD Risk score Mikey Bussing DC Jr., et al., 2013) failed to calculate for the following reasons:   The 2013 ASCVD risk score is only valid for ages 68 to 46    Other: (CHADS2VASc if Afib, PHQ9 if depression, MMRC or CAT for COPD, ACT, DEXA)  Social History   Tobacco Use  Smoking Status Never Smoker  Smokeless Tobacco Never Used   BP Readings from Last 3 Encounters:  03/06/20 (!) 145/80  09/19/19 (!) 135/57  08/29/19 (!) 132/77   Pulse Readings from Last 3 Encounters:  03/06/20 61  09/19/19 61  08/29/19 60   Wt Readings from Last 3 Encounters:  03/06/20 149  lb 8 oz (67.8 kg)  09/19/19 148 lb 6.4 oz (67.3 kg)  08/29/19 152 lb 2 oz (69 kg)    Assessment: Review of patient past medical history, allergies, medications, health status, including review of consultants reports, laboratory and other test data, was performed as part of comprehensive evaluation and provision of chronic care management services.   SDOH:  (Social Determinants of Health) assessments and interventions performed:  SDOH Interventions   Flowsheet Row Most Recent Value  SDOH Interventions   Food Insecurity Interventions Intervention Not Indicated  Physical Activity Interventions Intervention Not Indicated      CCM Care Plan  Allergies  Allergen Reactions  . Cephalexin Hives     Medications Reviewed Today    Reviewed by Cherre Robins, PharmD (Pharmacist) on 05/02/20 at 73  Med List Status: <None>  Medication Order Taking? Sig Documenting Provider Last Dose Status Informant  amLODipine (NORVASC) 5 MG tablet [id number] Yes Take 1 tablet (5 mg total) by mouth daily. Colon Branch, MD Taking Active   atorvastatin (LIPITOR) 10 MG tablet [id number] Yes Take 1 tablet (10 mg total) by mouth daily. Colon Branch, MD Taking Active   hydrochlorothiazide (HYDRODIURIL) 12.5 MG tablet [id number] Yes Take 1 tablet (12.5 mg total) by mouth daily. Colon Branch, MD Taking Active   lactulose, encephalopathy, (ENULOSE) 10 GM/15ML SOLN [id number] Yes Take 45 mLs (30 g total) by mouth daily as needed. Colon Branch, MD Taking Active   metoprolol succinate (TOPROL-XL) 25 MG 24 hr tablet [id number] Yes Take 1 tablet (25 mg total) by mouth daily. Colon Branch, MD Taking Active   Multiple Vitamin (MULTIVITAMIN) tablet [id number] Yes Take 1 tablet by mouth every other day. [provider] Taking Active   omeprazole (PRILOSEC) 20 MG capsule [id number] Yes Take 1 capsule (20 mg total) by mouth daily. Colon Branch, MD Taking Active   polyethylene glycol [facility] / GLYCOLAX) packet [id number] Yes Take 17 g by mouth daily as needed. Reported on 06/15/2015 [provider] Taking Active           Patient Active Problem List   Diagnosis Date Noted  . GERD (gastroesophageal reflux disease) 02/28/2019  . Melanoma of skin ([place]) 04/14/2018  . Atypical chest pain 04/01/2015  . PCP NOTES >>>>>>>>>>>>>>>>>>>> 11/09/2014  . DJD (degenerative joint disease) 05/06/2013  . Dysphagia 04/23/2012  . Annual physical exam 04/16/2011  . Chronic constipation 07/22/2010  . Hyperlipidemia 07/18/2009  . ADENOCARCINOMA, PROSTATE 07/01/2006  . PROSTATECTOMY, HX OF 07/01/2006  . Essential hypertension 05/19/2006    Immunization History  Administered Date(s) Administered  . Influenza Split 10/30/2010,  10/29/2011  . Influenza Whole 11/11/2006, 11/17/2007, 10/25/2008, 10/30/2009  . Influenza, High Dose Seasonal PF 10/18/2015  . Influenza,inj,Quad PF,6+ Mos 10/20/2012, 10/19/2013, 10/11/2014, 09/08/2018  . Influenza-Unspecified 09/17/2016, 10/27/2017, 09/27/2019  . PFIZER(Purple Top)SARS-COV-2 Vaccination 02/09/2019, 03/01/2019, 10/24/2019  . Pneumococcal Conjugate-13 09/07/2013  . Pneumococcal Polysaccharide-23 05/05/2001, 10/15/2007, 08/06/2017  . Td 03/29/2001  . Tdap 04/16/2011  . Zoster 03/10/2007  . Zoster Recombinat (Shingrix) 09/17/2016, 01/26/2017    Conditions to be addressed/monitored: HTN, HLD and GERD; chronic hyponatremia (mild), Chronic constipation  Care Plan : General Pharmacy (Adult)  Updates made by Cherre Robins, PHARMD since 05/02/2020 12:00 AM    Problem: CHL AMB "PATIENT-SPECIFIC PROBLEM"       Medication Assistance: None required.  Patient affirms current coverage meets needs.   Reviewed and updated medication list  Patient's preferred pharmacy is:  [facility], [address]  [place], [place], [place] [address] Phone: [phone] Fax: [facility] [address] [place] [address] Phone: [phone] Fax: [phone]   Follow Up:  Patient agrees to Care Plan and Follow-up.  Plan: No medication changes recommended today  Telephone follow up appointment with care management team member scheduled for:   6 months  Cherre Robins, PharmD Clinical Pharmacist [facility]

## 2020-05-02 NOTE — Patient Instructions (Signed)
Visit Information  PATIENT GOALS: Goals Addressed            This Visit's Progress   . Chronic Care Management Pharmacy Care Plan       CARE PLAN ENTRY (see longitudinal plan of care for additional care plan information)  Current Barriers:  . Chronic Disease Management support, education, and care coordination needs related to Hypertension, Hyperlipidemia, GERD, Chronic Constipation   Hypertension BP Readings from Last 3 Encounters:  03/06/20 (!) 145/80  09/19/19 (!) 135/57  08/29/19 (!) 132/77   . Pharmacist Clinical Goal(s): o Over the next 90 days, patient will work with PharmD and providers to maintain BP goal <140/90 . Current regimen:  . Amlodipine 5mg  daily . HCTZ 12.5mg  daily  . Metoprolol succinate 25mg  daily  . Interventions: o Discussed BP goals and recent home BP readings . Patient self care activities - Over the next 90 days, patient will: o Check BP 2-3 times per week, document, and provide at future appointments o Ensure daily salt intake < 2300 mg/day  Hyperlipidemia Lab Results  Component Value Date/Time   LDLCALC 88 02/28/2019 08:20 AM   LDLDIRECT 82.0 03/06/2020 01:57 PM   . Pharmacist Clinical Goal(s): o Over the next 90 days, patient will work with PharmD and providers to maintain LDL goal < 100 . Current regimen:  . Atorvastatin 10mg  daily  . Interventions: o none . Patient self care activities - Over the next 90 days, patient will: o Maintain cholesterol medication regimen.   GERD . Pharmacist Clinical Goal(s) o Over the next 90 days, patient will work with PharmD and providers to reduce symptoms of GERD and polypharmacy . Current regimen:  o Omeprazole 20mg  every other day . Interventions: o none . Patient self care activities - Over the next 90 days, patient will: o Continue omeprazole 20mg  daily   Chronic Constipation . Pharmacist Clinical Goal(s) o Over the next 90 days, patient will work with PharmD and providers to reduce  symptoms of constipation . Current regimen:  . Lactulose 10g/55mL 67mLs (30g) daily if needed . Miralax 17g daily . Interventions: o Discussed bowel regimen. Rare issues with constipation with Miralax daily and lactulose as needed if no BM in 3 days.  . Patient self care activities - Over the next 90 days, patient will: o Maintain constipation medication regimen   Medication management . Pharmacist Clinical Goal(s): o Over the next 90 days, patient will work with PharmD and providers to maintain optimal medication adherence . Current pharmacy: [facility] . Interventions o Comprehensive medication review performed. o Continue current medication management strategy . Patient self care activities - Over the next 90 days, patient will: o Focus on medication adherence by filling and taking medications appropriately  o Take medications as prescribed o Report any questions or concerns to PharmD and/or provider(s)  Please see past updates related to this goal by clicking on the "Past Updates" button in the selected goal         The patient verbalized understanding of instructions, educational materials, and care plan provided today and declined offer to receive copy of patient instructions, educational materials, and care plan.   Telephone follow up appointment with care management team member scheduled for:  6 months Cherre Robins, PharmD Clinical Pharmacist [facility]

## 2020-05-03 ENCOUNTER — Ambulatory Visit: Payer: Medicare Other | Attending: Internal Medicine

## 2020-05-03 DIAGNOSIS — Z23 Encounter for immunization: Secondary | ICD-10-CM

## 2020-05-03 NOTE — Chronic Care Management (AMB) (Signed)
Chronic Care Management Pharmacy Note  05/03/2020  Name:  Michael Johns MRN:  [id number] DOB:  Jun 03, 1932  Subjective: Michael Johns is an 87 y.o. year old male who is a primary patient of Paz, Alda Berthold, MD.  The CCM team was consulted for assistance with disease management and care coordination needs.    Engaged with patient by telephone for follow up visit in response to provider referral for pharmacy case management and/or care coordination services.   Consent to Services:  The patient was given information about Chronic Care Management services, agreed to services, and gave verbal consent prior to initiation of services.  Please see initial visit note for detailed documentation.   Patient Care Team: Colon Branch, MD as PCP - General Inda Castle, MD (Inactive) as Consulting Physician (Gastroenterology) Vevelyn Royals, MD as Consulting Physician (Ophthalmology) Izora Gala, MD as Consulting Physician (Otolaryngology)  Recent office visits: 03/06/2020 - PCP (Dr Larose Kells) for routine follow up; Labs checked. No medication changes.   Recent consult visits: None in last 6 months  Hospital visits: None in previous 6 months  Objective:  Lab Results  Component Value Date   CREATININE 1.26 03/06/2020   CREATININE 1.11 08/29/2019   CREATININE 1.12 02/28/2019    Lab Results  Component Value Date   HGBA1C 5.6 08/06/2017   Last diabetic Eye exam: No results found for: HMDIABEYEEXA  Last diabetic Foot exam: No results found for: HMDIABFOOTEX      Component Value Date/Time   CHOL 161 03/06/2020 1357   TRIG 250.0 (H) 03/06/2020 1357   HDL 51.20 03/06/2020 1357   CHOLHDL 3 03/06/2020 1357   VLDL 50.0 (H) 03/06/2020 1357   LDLCALC 88 02/28/2019 0820   LDLDIRECT 82.0 03/06/2020 1357    Hepatic Function Latest Ref Rng & Units 03/06/2020 02/28/2019 02/09/2018  Total Protein 6.0 - 8.3 g/dL 7.6 7.2 -  Albumin 3.5 - 5.2 g/dL 4.4 4.2 -  AST 0 - 37 U/L _0 ALT 0 - 53 U/L _1 Alk Phosphatase 39 - 117 U/L 65 73 -  Total Bilirubin 0.2 - 1.2 mg/dL 0.6 0.7 -    Lab Results  Component Value Date/Time   TSH 3.48 03/06/2020 01:57 PM   TSH 3.71 08/29/2019 08:27 AM   FREET4 0.72 03/06/2020 01:57 PM   FREET4 0.74 02/09/2018 08:16 AM    CBC Latest Ref Rng & Units 08/29/2019 08/25/2018 08/06/2017  WBC 4.0 - 10.5 K/uL 8.9 7.4 7.0  Hemoglobin 13.0 - 17.0 g/dL 13.8 14.4 14.5  Hematocrit 39.0 - 52.0 % 39.9 41.5 40.7  Platelets 150.0 - 400.0 K/uL 259.0 266.0 273.0    Lab Results  Component Value Date/Time   VD25OH 49 05/06/2013 09:22 AM    Clinical ASCVD: No  The ASCVD Risk score Mikey Bussing DC Jr., et al., 2013) failed to calculate for the following reasons:   The 2013 ASCVD risk score is only valid for ages 78 to 45    Other: (CHADS2VASc if Afib, PHQ9 if depression, MMRC or CAT for COPD, ACT, DEXA)  Social History   Tobacco Use  Smoking Status Never Smoker  Smokeless Tobacco Never Used   BP Readings from Last 3 Encounters:  03/06/20 (!) 145/80  09/19/19 (!) 135/57  08/29/19 (!) 132/77   Pulse Readings from Last 3 Encounters:  03/06/20 61  09/19/19 61  08/29/19 60   Wt Readings from Last 3 Encounters:  03/06/20 149 lb 8 oz (67.8  kg)  09/19/19 148 lb 6.4 oz (67.3 kg)  08/29/19 152 lb 2 oz (69 kg)    Assessment: Review of patient past medical history, allergies, medications, health status, including review of consultants reports, laboratory and other test data, was performed as part of comprehensive evaluation and provision of chronic care management services.   SDOH:  (Social Determinants of Health) assessments and interventions performed:  SDOH Interventions   Flowsheet Row Most Recent Value  SDOH Interventions   Food Insecurity Interventions Intervention Not Indicated  Physical Activity Interventions Intervention Not Indicated      CCM Care Plan  Allergies  Allergen Reactions  . Cephalexin Hives    Medications Reviewed Today     Reviewed by Cherre Robins, PharmD (Pharmacist) on 05/02/20 at 64  Med List Status: <None>  Medication Order Taking? Sig Documenting Provider Last Dose Status Informant  amLODipine (NORVASC) 5 MG tablet [id number] Yes Take 1 tablet (5 mg total) by mouth daily. Colon Branch, MD Taking Active   atorvastatin (LIPITOR) 10 MG tablet [id number] Yes Take 1 tablet (10 mg total) by mouth daily. Colon Branch, MD Taking Active   hydrochlorothiazide (HYDRODIURIL) 12.5 MG tablet [id number] Yes Take 1 tablet (12.5 mg total) by mouth daily. Colon Branch, MD Taking Active   lactulose, encephalopathy, (ENULOSE) 10 GM/15ML SOLN [id number] Yes Take 45 mLs (30 g total) by mouth daily as needed. Colon Branch, MD Taking Active   metoprolol succinate (TOPROL-XL) 25 MG 24 hr tablet [id number] Yes Take 1 tablet (25 mg total) by mouth daily. Colon Branch, MD Taking Active   Multiple Vitamin (MULTIVITAMIN) tablet [id number] Yes Take 1 tablet by mouth every other day. [provider] Taking Active   omeprazole (PRILOSEC) 20 MG capsule [id number] Yes Take 1 capsule (20 mg total) by mouth daily. Colon Branch, MD Taking Active   polyethylene glycol [facility] / GLYCOLAX) packet [id number] Yes Take 17 g by mouth daily as needed. Reported on 06/15/2015 [provider] Taking Active           Patient Active Problem List   Diagnosis Date Noted  . GERD (gastroesophageal reflux disease) 02/28/2019  . Melanoma of skin ([place]) 04/14/2018  . Atypical chest pain 04/01/2015  . PCP NOTES >>>>>>>>>>>>>>>>>>>> 11/09/2014  . DJD (degenerative joint disease) 05/06/2013  . Dysphagia 04/23/2012  . Annual physical exam 04/16/2011  . Chronic constipation 07/22/2010  . Hyperlipidemia 07/18/2009  . ADENOCARCINOMA, PROSTATE 07/01/2006  . PROSTATECTOMY, HX OF 07/01/2006  . Essential hypertension 05/19/2006    Immunization History  Administered Date(s) Administered  . Influenza Split 10/30/2010, 10/29/2011  . Influenza Whole 11/11/2006,  11/17/2007, 10/25/2008, 10/30/2009  . Influenza, High Dose Seasonal PF 10/18/2015  . Influenza,inj,Quad PF,6+ Mos 10/20/2012, 10/19/2013, 10/11/2014, 09/08/2018  . Influenza-Unspecified 09/17/2016, 10/27/2017, 09/27/2019  . PFIZER Comirnaty(Gray Top)Covid-19 Tri-Sucrose Vaccine 05/03/2020  . PFIZER(Purple Top)SARS-COV-2 Vaccination 02/09/2019, 03/01/2019, 10/24/2019  . Pneumococcal Conjugate-13 09/07/2013  . Pneumococcal Polysaccharide-23 05/05/2001, 10/15/2007, 08/06/2017  . Td 03/29/2001  . Tdap 04/16/2011  . Zoster 03/10/2007  . Zoster Recombinat (Shingrix) 09/17/2016, 01/26/2017    Conditions to be addressed/monitored: HTN, HLD and GERD; chronic hyponatremia (mild), Chronic constipation  Care Plan : General Pharmacy (Adult)  Updates made by Cherre Robins, PHARMD since 05/03/2020 12:00 AM    Problem: Chronic Care and Medication Management   Priority: Medium  Onset Date: 05/02/2020  Note:   Current Barriers:  . none  Pharmacist Clinical Goal(s):  Marland Kitchen Over the next 180 days, patient will maintain control  of HTN, GERD and hyperlipidemia as evidenced by meeting goals stated below  through collaboration with PharmD and provider.   Interventions: . 1:1 collaboration with Colon Branch, MD regarding development and update of comprehensive plan of care as evidenced by provider attestation and co-signature . Inter-disciplinary care team collaboration (see longitudinal plan of care) . Comprehensive medication review performed; medication list updated in electronic medical record  Hypertension: . Controlled; current treatment:amlodipine 20m daily; metoprolol succinate 2153mdaily and HTCZ 12.53m43maily ;  . Current home readings: was 118/63 today; per patient this BP is similar to other recent home BP readings.  . Denies hypotensive/hypertensive symptoms . Recommended continue current therapy for HTN  Hyperlipidemia: . Controlled; current treatment:atorvastatin 40m76mily   . Had complained  about possible muscle aches with atorvastatin but patient states they are mild and doesn't mind continuing atorvastatin .   . CoMarland Kitchennseled on importance of exercise and limiting fat in diet for heart health Recommended continue current lipid lowering medication  GERD:  . Currently controlled with omeprazole 20mg66mly.  . Denies breakthrough reflux symptoms . Recommend continue current therapy  Chronic constipation:  . Currently well controlled per patient  . Current therapy is Miralax 17 gram daily and Lactulose 453ml 53meeded (patient will take if no BM in 3 days).  . Recommend continue current bowel regimen.    Patient Goals/Self-Care Activities . Over the next 180 days, patient will:  take medications as prescribed and check blood pressure 2 to 3 times per week, document, and provide at future appointments  Follow Up Plan: Telephone follow up appointment with care management team member scheduled for:  6 months      Medication Assistance: None required.  Patient affirms current coverage meets needs.   Reviewed and updated medication list  [facility] [address]: [phone] [place] [address]: [phone] Up:  Patient agrees to Care Plan and Follow-up.  Plan: No medication changes recommended today  Telephone follow up appointment with care management team member scheduled for:   6 months  Hooria Gasparini Cherre RobinsmD Clinical Pharmacist [facility]

## 2020-05-03 NOTE — Progress Notes (Signed)
   Covid-19 Vaccination Clinic  Name:  Michael Johns    MRN: [id number] DOB: 23-Nov-1932  05/03/2020  [name] was observed post Covid-19 immunization for 15 minutes without incident. He was provided with Vaccine Information Sheet and instruction to access the V-Safe system.   [name] was instructed to call 911 with any severe reactions post vaccine: Marland Kitchen Difficulty breathing  . Swelling of face and throat  . A fast heartbeat  . A bad rash all over body  . Dizziness and weakness   Immunizations Administered    Name Date Dose VIS Date Route   PFIZER Comrnaty(Gray TOP) Covid-19 Vaccine 05/03/2020 10:52 AM 0.3 mL 01/05/2020 Intramuscular   Manufacturer: [place]   Lot: [id number]   [place]: [phone]

## 2020-05-10 ENCOUNTER — Other Ambulatory Visit (HOSPITAL_BASED_OUTPATIENT_CLINIC_OR_DEPARTMENT_OTHER): Payer: Self-pay

## 2020-05-10 MED ORDER — PFIZER-BIONT COVID-19 VAC-TRIS 30 MCG/0.3ML IM SUSP
INTRAMUSCULAR | 0 refills | Status: DC
Start: 2020-05-03 — End: 2020-08-22
  Filled 2020-05-10: qty 0.3, 1d supply, fill #0

## 2020-05-11 ENCOUNTER — Other Ambulatory Visit (HOSPITAL_BASED_OUTPATIENT_CLINIC_OR_DEPARTMENT_OTHER): Payer: Self-pay

## 2020-08-01 ENCOUNTER — Encounter (HOSPITAL_BASED_OUTPATIENT_CLINIC_OR_DEPARTMENT_OTHER): Payer: Self-pay | Admitting: Emergency Medicine

## 2020-08-01 ENCOUNTER — Other Ambulatory Visit: Payer: Self-pay

## 2020-08-01 ENCOUNTER — Emergency Department (HOSPITAL_BASED_OUTPATIENT_CLINIC_OR_DEPARTMENT_OTHER)
Admission: EM | Admit: 2020-08-01 | Discharge: 2020-08-01 | Disposition: A | Discharge status: Home / Self Care | Payer: Medicare Other | Attending: Emergency Medicine | Admitting: Emergency Medicine

## 2020-08-01 ENCOUNTER — Emergency Department (HOSPITAL_BASED_OUTPATIENT_CLINIC_OR_DEPARTMENT_OTHER): Payer: Medicare Other

## 2020-08-01 DIAGNOSIS — I10 Essential (primary) hypertension: Secondary | ICD-10-CM | POA: Diagnosis not present

## 2020-08-01 DIAGNOSIS — Z85820 Personal history of malignant melanoma of skin: Secondary | ICD-10-CM | POA: Diagnosis not present

## 2020-08-01 DIAGNOSIS — Z79899 Other long term (current) drug therapy: Secondary | ICD-10-CM | POA: Insufficient documentation

## 2020-08-01 DIAGNOSIS — K5900 Constipation, unspecified: Secondary | ICD-10-CM | POA: Insufficient documentation

## 2020-08-01 DIAGNOSIS — Z8546 Personal history of malignant neoplasm of prostate: Secondary | ICD-10-CM | POA: Diagnosis not present

## 2020-08-01 LAB — CBC WITH DIFFERENTIAL/PLATELET
Abs Immature Granulocytes: 0.03 10*3/uL (ref 0.00–0.07)
Basophils Absolute: 0.1 10*3/uL (ref 0.0–0.1)
Basophils Relative: 1 %
Eosinophils Absolute: 0.3 10*3/uL (ref 0.0–0.5)
Eosinophils Relative: 3 %
HCT: 39.8 % (ref 39.0–52.0)
Hemoglobin: 14.6 g/dL (ref 13.0–17.0)
Immature Granulocytes: 0 %
Lymphocytes Relative: 26 %
Lymphs Abs: 2.3 10*3/uL (ref 0.7–4.0)
MCH: 31.7 pg (ref 26.0–34.0)
MCHC: 36.7 g/dL — ABNORMAL HIGH (ref 30.0–36.0)
MCV: 86.5 fL (ref 80.0–100.0)
Monocytes Absolute: 1 10*3/uL (ref 0.1–1.0)
Monocytes Relative: 11 %
Neutro Abs: 5.3 10*3/uL (ref 1.7–7.7)
Neutrophils Relative %: 59 %
Platelets: 233 10*3/uL (ref 150–400)
RBC: 4.6 MIL/uL (ref 4.22–5.81)
RDW: 13.5 % (ref 11.5–15.5)
WBC: 9 10*3/uL (ref 4.0–10.5)
nRBC: 0 % (ref 0.0–0.2)

## 2020-08-01 LAB — BASIC METABOLIC PANEL
Anion gap: 9 (ref 5–15)
BUN: 14 mg/dL (ref 8–23)
CO2: 27 mmol/L (ref 22–32)
Calcium: 9.3 mg/dL (ref 8.9–10.3)
Chloride: 93 mmol/L — ABNORMAL LOW (ref 98–111)
Creatinine, Ser: 1.08 mg/dL (ref 0.61–1.24)
GFR, Estimated: >60 mL/min (ref >60)
Glucose, Bld: 97 mg/dL (ref 70–99)
Potassium: 3.5 mmol/L (ref 3.5–5.1)
Sodium: 129 mmol/L — ABNORMAL LOW (ref 135–145)

## 2020-08-01 LAB — OCCULT BLOOD X 1 CARD TO LAB, STOOL: Fecal Occult Bld: NEGATIVE

## 2020-08-01 MED ORDER — FLEET ENEMA 7-19 GM/118ML RE ENEM
1.0000 | ENEMA | Freq: Once | RECTAL | Status: AC
  Administered 2020-08-01: 1 via RECTAL
  Filled 2020-08-01: qty 1

## 2020-08-01 MED ORDER — IOHEXOL 300 MG/ML  SOLN
100.0000 mL | Freq: Once | INTRAMUSCULAR | Status: AC | PRN
  Administered 2020-08-01: 100 mL via INTRAVENOUS

## 2020-08-01 NOTE — ED Notes (Signed)
Large amount brown soft and liquid stool noted. Dr Melina Copa aware.

## 2020-08-01 NOTE — Discharge Instructions (Addendum)
You were seen in the emergency department for constipation.  You had lab work and a CAT scan that did not show any significant abnormalities.  You received an enema with good improvement in your symptoms.  Please follow-up with your primary care doctor.

## 2020-08-01 NOTE — ED Triage Notes (Signed)
Pt concerned due to not having BM in 4 days. Pt reports taking lactulose and mag citrate today with no results.

## 2020-08-01 NOTE — ED Provider Notes (Signed)
[place] EMERGENCY DEPARTMENT Provider Note   CSN: [id number] Arrival date & time: 08/01/20  1803     History Chief Complaint  Patient presents with   Constipation    Michael Johns is a 87 y.o. male.  He has a history of chronic constipation.  History of the bowels in the last 4 days.  Usually will improve with taking lactulose.  He took 2 doses yesterday and a dose again today.  He also tried a bottle of magnesium citrate today without any improvement.  No nausea or vomiting.  No rectal pain.  No abdominal pain.  No fevers or chills.  The history is provided by the patient.  Constipation Severity:  Moderate Time since last bowel movement:  4 days Timing:  Intermittent Progression:  Unchanged Chronicity:  Recurrent Stool description:  None produced Relieved by:  Nothing Worsened by:  Nothing Ineffective treatments:  Laxatives Associated symptoms: no abdominal pain, no back pain, no diarrhea, no dysuria, no fever, no nausea and no vomiting       Past Medical History:  Diagnosis Date   Abdominal pain 11/09   due to constipation and ?pancreatitis- admitted   ADENOCARCINOMA, PROSTATE 07/01/2006   Atypical chest pain 04/01/2015   Constipation    chronic, impaction ~2012 (admited) and 01-2015   GERD (gastroesophageal reflux disease)    Gout    see 09-2008 note   Hearing decreased    right, 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   HYPERLIPIDEMIA, MILD 07/18/2009   HYPERTENSION 05/19/2006   Melanoma ([place])     Patient Active Problem List   Diagnosis Date Noted   GERD (gastroesophageal reflux disease) 02/28/2019   Melanoma of skin ([place]) 04/14/2018   Atypical chest pain 04/01/2015   PCP NOTES >>>>>>>>>>>>>>>>>>>> 11/09/2014   DJD (degenerative joint disease) 05/06/2013   Dysphagia 04/23/2012   Annual physical exam 04/16/2011   Chronic constipation 07/22/2010   Hyperlipidemia 07/18/2009   ADENOCARCINOMA, PROSTATE 07/01/2006   PROSTATECTOMY, HX OF 07/01/2006   Essential hypertension  05/19/2006    Past Surgical History:  Procedure Laterality Date   APPENDECTOMY     cataracts Bilateral 2014   lens implantinfo scanned (02-09-17)   INGUINAL HERNIA REPAIR Right 11/2007   MELANOMA EXCISION Right 05/12/2018   R leg melanoma. Dr  Winifred Olive at [facility] in [place].   POLYPECTOMY     PROSTATECTOMY  05/2007   LADS (-) Bone scan (-)   Rt middle ear surgery     sinus polyp removed     TONSILLECTOMY         Family History  Problem Relation Age of Onset   Hypertension Mother    Diabetes Mother    Heart disease Mother        CABG 56 y/o   Esophageal cancer Father    Heart disease Maternal Grandmother        MI   Leukemia Maternal Grandfather    Cancer Other        all paternal uncles-types unknown   Prostate cancer Neg Hx    Colon cancer Neg Hx    Rectal cancer Neg Hx    Stomach cancer Neg Hx     Social History   Tobacco Use   Smoking status: Never   Smokeless tobacco: Never  Substance Use Topics   Alcohol use: Yes    Comment: socially    Drug use: No    Home Medications Prior to Admission medications   Medication Sig Start  Date End Date Taking? Authorizing Provider  amLODipine (NORVASC) 5 MG tablet Take 1 tablet (5 mg total) by mouth daily. 11/16/19   Colon Branch, MD  atorvastatin (LIPITOR) 10 MG tablet Take 1 tablet (10 mg total) by mouth daily. 11/16/19   Colon Branch, MD  COVID-19 mRNA Vac-TriS, Pfizer, (PFIZER-BIONT COVID-19 VAC-TRIS) SUSP injection Inject into the muscle. 05/03/20   Carlyle Basques, MD  hydrochlorothiazide (HYDRODIURIL) 12.5 MG tablet Take 1 tablet (12.5 mg total) by mouth daily. 11/16/19   Colon Branch, MD  lactulose, encephalopathy, (ENULOSE) 10 GM/15ML SOLN Take 45 mLs (30 g total) by mouth daily as needed. 04/09/20   Colon Branch, MD  metoprolol succinate (TOPROL-XL) 25 MG 24 hr tablet Take 1 tablet (25 mg total) by mouth daily. 10/27/19   Colon Branch, MD  Multiple Vitamin (MULTIVITAMIN) tablet Take 1  tablet by mouth every other day.    [provider]  omeprazole (PRILOSEC) 20 MG capsule Take 1 capsule (20 mg total) by mouth daily. 08/08/19   Colon Branch, MD  polyethylene glycol [facility] / Floria Raveling) packet Take 17 g by mouth daily as needed. Reported on 06/15/2015    [provider]    Allergies    Cephalexin  Review of Systems   Review of Systems  Constitutional:  Negative for fever.  HENT:  Negative for sore throat.   Eyes:  Negative for visual disturbance.  Respiratory:  Negative for shortness of breath.   Cardiovascular:  Negative for chest pain.  Gastrointestinal:  Positive for constipation. Negative for abdominal pain, diarrhea, nausea and vomiting.  Genitourinary:  Negative for dysuria.  Musculoskeletal:  Negative for back pain.  Skin:  Negative for rash.  Neurological:  Negative for headaches.   Physical Exam Updated Vital Signs BP (!) 144/97 (BP Location: Right Arm)   Pulse 64   Temp 98.6 F (37 C) (Oral)   Resp 17   Ht 5\' 4"  (1.626 m)   Wt 67.6 kg   SpO2 100%   BMI 25.58 kg/m   Physical Exam Vitals and nursing note reviewed.  Constitutional:      Appearance: Normal appearance. He is well-developed.  HENT:     Head: Normocephalic and atraumatic.  Eyes:     Conjunctiva/sclera: Conjunctivae normal.  Cardiovascular:     Rate and Rhythm: Normal rate and regular rhythm.     Heart sounds: No murmur heard. Pulmonary:     Effort: Pulmonary effort is normal. No respiratory distress.     Breath sounds: Normal breath sounds.  Abdominal:     Palpations: Abdomen is soft. There is no mass.     Tenderness: There is no abdominal tenderness. There is no guarding or rebound.  Musculoskeletal:        General: No deformity or signs of injury. Normal range of motion.     Cervical back: Neck supple.  Skin:    General: Skin is warm and dry.  Neurological:     General: No focal deficit present.     Mental Status: He is alert.     GCS: GCS eye subscore  is 4. GCS verbal subscore is 5. GCS motor subscore is 6.    ED Results / Procedures / Treatments   Labs (all labs ordered are listed, but only abnormal results are displayed) Labs Reviewed  BASIC METABOLIC PANEL - Abnormal; Notable for the following components:      Result Value   Sodium 129 (*)    Chloride  93 (*)    All other components within normal limits  CBC WITH DIFFERENTIAL/PLATELET - Abnormal; Notable for the following components:   MCHC 36.7 (*)    All other components within normal limits  OCCULT BLOOD X 1 CARD TO LAB, STOOL    EKG None  Radiology DG Abdomen 1 View  Result Date: 08/01/2020 CLINICAL DATA:  Constipation EXAM: ABDOMEN - 1 VIEW COMPARISON:  07/06/2010 FINDINGS: The bowel gas pattern is normal. No radio-opaque calculi or other significant radiographic abnormality are seen. IMPRESSION: Negative. Electronically Signed   By: Ulyses Jarred M.D.   On: 08/01/2020 19:03   CT Abdomen Pelvis W Contrast  Result Date: 08/01/2020 CLINICAL DATA:  Abdominal distension.  No bowel movement for 4 days. EXAM: CT ABDOMEN AND PELVIS WITH CONTRAST TECHNIQUE: Multidetector CT imaging of the abdomen and pelvis was performed using the standard protocol following bolus administration of intravenous contrast. CONTRAST:  12mL OMNIPAQUE IOHEXOL 300 MG/ML  SOLN COMPARISON:  Radiograph earlier today.  Remote CT 12/03/2018 FINDINGS: Lower chest: Punctate calcified granuloma in the left lower lobe. No pleural effusion or acute airspace disease. Normal heart size with coronary artery calcifications. Hepatobiliary: Diffusely decreased hepatic density typical of steatosis. No discrete focal lesion. Unremarkable gallbladder. No calcified gallstone. No biliary dilatation. Pancreas: No ductal dilatation or inflammation.  No pancreatic mass. Spleen: Normal in size without focal abnormality. Adrenals/Urinary Tract: Normal adrenal glands. Prominence of both renal pelvis, symmetric with homogeneous renal  enhancement. There is symmetric renal excretion. No perinephric edema to suggest renal obstruction. The urinary bladder is distended. No bladder wall thickening Stomach/Bowel: Tiny hiatal hernia. Fluid distended stomach. There is a small duodenal diverticulum. No small bowel obstruction or inflammation. Appendix not definitively seen. Fluid in the quad stool in the ascending colon. Moderate volume of formed stool in the transverse and descending colon. Moderate stool in the sigmoid colon which is redundant and courses into the right upper quadrant. Mild colonic diverticulosis. No diverticulitis. No abnormal rectal distention. No bowel wall thickening or inflammation. Vascular/Lymphatic: Aortic atherosclerosis without aneurysm patent portal vein. No enlarged lymph nodes in the abdomen or pelvis. Reproductive: Prostatectomy. Other: No free air, free fluid, or intra-abdominal fluid collection. Minimal fat containing left inguinal hernia. Musculoskeletal: Mild for age degenerative change in the spine. There are no acute or suspicious osseous abnormalities. IMPRESSION: 1. No acute abnormality in the abdomen/pelvis. Moderate volume of formed stool throughout the colon, particularly in the sigmoid colon which is redundant and courses into the right upper quadrant, constipation pattern. No evidence of obstruction or inflammation. 2. Hepatic steatosis. 3. Distended urinary bladder without wall thickening. Aortic Atherosclerosis (ICD10-I70.0). Electronically Signed   By: Keith Rake M.D.   On: 08/01/2020 21:41    Procedures Procedures   Medications Ordered in ED Medications  iohexol (OMNIPAQUE) 300 MG/ML solution 100 mL (100 mLs Intravenous Contrast Given 08/01/20 2128)  sodium phosphate (FLEET) 7-19 GM/118ML enema 1 enema (1 enema Rectal Given 08/01/20 2200)    ED Course  I have reviewed the triage vital signs and the nursing notes.  Pertinent labs & imaging results that were available during my care of the  patient were reviewed by me and considered in my medical decision making (see chart for details).  Clinical Course as of 08/02/20 1008  Wed Aug 01, 2020  2150 Rectal exam done with [facility] as chaperone.  Normal tone brown stool.  Sample sent to lab.  We will put the patient in for an enema.  CT showing stool  but no evidence of obstruction. [MB]  2237 Patient received an enema with good results.  He is comfortable plan for discharge. [MB]    Clinical Course User Index [MB] Hayden Rasmussen, MD   MDM Rules/Calculators/A&P                         This patient complains of constipation; this involves an extensive number of treatment Options and is a complaint that carries with it a high risk of complications and Morbidity. The differential includes constipation, obstruction, perforation, diverticulitis, colitis  I ordered, reviewed and interpreted labs, which included CBC with normal white count normal hemoglobin, chemistries with low sodium, runs chronically low but this is lower than baseline, fecal occult negative I ordered medication please enema with improvement in his symptoms I ordered imaging studies which included abdominal series and CT abdomen and pelvis with contrast and I independently    visualized and interpreted imaging which showed moderate stool otherwise no acute findings Previous records obtained and reviewed in epic, no recent admissions  After the interventions stated above, I reevaluated the patient and found patient to be symptomatically improved.  He had moderate results with enema.  Recommended continual bowel regiment and follow-up with PCP.  Return instructions discussed   Final Clinical Impression(s) / ED Diagnoses Final diagnoses:  Constipation, unspecified constipation type    Rx / DC Orders ED Discharge Orders     None        Hayden Rasmussen, MD 08/02/20 1010

## 2020-08-01 NOTE — ED Notes (Signed)
Patient transported to CT 

## 2020-08-19 ENCOUNTER — Other Ambulatory Visit: Payer: Self-pay | Admitting: Internal Medicine

## 2020-08-21 ENCOUNTER — Telehealth: Payer: Self-pay | Admitting: *Deleted

## 2020-08-21 NOTE — Telephone Encounter (Signed)
Triage nurse called and stated that patient needed to be seen w/in 24 hours and transferred call.  Spoke with patient and he is having what he thinks is chest muscle pain- he thinks its muscular.  Appointment made for tomorrow at [facility] if pain gets worse go to nearest ED and not wait for appointment.

## 2020-08-22 ENCOUNTER — Ambulatory Visit (INDEPENDENT_AMBULATORY_CARE_PROVIDER_SITE_OTHER): Payer: Medicare Other | Admitting: Internal Medicine

## 2020-08-22 ENCOUNTER — Encounter: Payer: Self-pay | Admitting: Internal Medicine

## 2020-08-22 ENCOUNTER — Other Ambulatory Visit: Payer: Self-pay

## 2020-08-22 VITALS — BP 132/68 | HR 61 | Temp 97.8°F | Resp 16 | Ht 64.0 in | Wt 149.2 lb

## 2020-08-22 DIAGNOSIS — R079 Chest pain, unspecified: Secondary | ICD-10-CM

## 2020-08-22 DIAGNOSIS — S233XXA Sprain of ligaments of thoracic spine, initial encounter: Secondary | ICD-10-CM | POA: Diagnosis not present

## 2020-08-22 NOTE — Telephone Encounter (Signed)
urse Assessment Nurse: Delphina Cahill, RN, Santiago Glad Date/Time Eilene Ghazi Time): 08/21/2020 4:17:03 PM Confirm and document reason for call. If symptomatic, describe symptoms. ---Caller states he has a dull ache around his heart. Also, has some pain around his left shoulder in the back. States unsure if a pulled muscle because gets worse with movement. Chest pain doesn't get worse with movement. Played golf yesterday. It has been this way for over a week. Does the patient have any new or worsening symptoms? ---Yes Will a triage be completed? ---Yes Related visit to physician within the last 2 weeks? ---No Does the PT have any chronic conditions? (i.e. diabetes, asthma, this includes High risk factors for pregnancy, etc.) ---No Is this a behavioral health or substance abuse call? ---No Guidelines Guideline Title Affirmed Question Affirmed Notes Nurse Date/Time Eilene Ghazi Time) Chest Pain [1] Chest pain lasts < 5 minutes AND [2] NO chest pain or cardiac symptoms (e.g., breathing difficulty, sweating) now (Exception: chest pains that last only a few seconds) Delphina Cahill, RN, Santiago Glad 08/21/2020 4:19:45 PM PLEASE NOTE: All timestamps contained within this report are represented as [place] Standard Time. CONFIDENTIALTY NOTICE: This fax transmission is intended only for the addressee. It contains information that is legally privileged, confidential or otherwise protected from use or disclosure. If you are not the intended recipient, you are strictly prohibited from reviewing, disclosing, copying using or disseminating any of this information or taking any action in reliance on or regarding this information. If you have received this fax in error, please notify us immediately by telephone so that we can arrange for its return to [place]. Phone: [phone], Toll-Free: [phone], Fax: [phone] Page: 2 of 2 Call Id: [id number] [place]. Time Eilene Ghazi Time) Disposition Final User 08/21/2020 4:15:00 PM Send to  Urgent Marina Goodell, [place] 08/21/2020 4:28:17 PM See PCP within 24 Hours Yes Delphina Cahill, RN, [place] Pellant Disagree/Comply Comply Caller Understands Yes PreDisposition InappropriateToAsk Care Advice Given Per Guideline SEE PCP WITHIN 24 HOURS: * IF OFFICE WILL BE OPEN: You need to be examined within the next 24 hours. Call your doctor (or NP/PA) when the office opens and make an appointment. CALL BACK IF: * Difficulty breathing occurs * Chest pain increases in frequency, duration or severity * Chest pain lasts over 5 minutes * You become worse CARE ADVICE given per Chest Pain (Adult) guideline. Referrals Warm transfer to backline

## 2020-08-22 NOTE — Progress Notes (Signed)
Subjective:    Patient ID: Michael Johns, male    DOB: 1932-06-17, 87 y.o.   MRN: [id number]  DOS:  08/22/2020 Type of visit - description: Acute  Reports two different pains.  Several weeks history off on anterior, left-sided chest pain.  Last 20 seconds, happen a couple times a day. I ask about triggers and he could not tell if moving/eating or deep breathing cause any difference.  Pain seems to be random. Pain does not increase when he bends forward No radiation.  No associated with nausea or sweats.  Also a week ago moved some furniture, since then is having left trapezoid pain, described as a dull ache, on and off, decreased with rest and increased with shoulder movement  Both pains are unrelated in time  Review of Systems No fever chills No shortness of breath, lower extremity edema or palpitations GERD well-controlled No cough or sputum production. He plays golf regularly and take walks without any symptom No neck pain, no upper extremity paresthesias.  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 Dr  Winifred Olive at the [facility] in [place].   POLYPECTOMY     PROSTATECTOMY  05/2007   LADS (-) Bone scan (-)   Rt middle ear surgery     sinus polyp removed     TONSILLECTOMY      Allergies as of 08/22/2020       Reactions    Cephalexin Hives        Medication List        Accurate as of August 22, 2020  9:43 PM. If you have any questions, ask your nurse or doctor.          STOP taking these medications    Pfizer-BioNT COVID-19 Vac-TriS Susp injection Generic drug: COVID-19 mRNA Vac-TriS Therapist, music) Stopped by: Kathlene November, MD       TAKE these medications    amLODipine 5 MG tablet Commonly known as: NORVASC TAKE 1 TABLET BY MOUTH  DAILY   atorvastatin 10 MG tablet Commonly known as: LIPITOR TAKE 1 TABLET BY MOUTH  DAILY   Enulose 10 GM/15ML Soln Generic drug: lactulose (encephalopathy) TAKE 45 MLS BY MOUTH DAILY  AS NEEDED   hydrochlorothiazide 12.5 MG tablet Commonly known as: HYDRODIURIL TAKE 1 TABLET BY MOUTH  DAILY   metoprolol succinate 25 MG 24 hr tablet Commonly known as: TOPROL-XL TAKE 1 TABLET BY MOUTH  DAILY   multivitamin tablet Take 1 tablet by mouth every other day.   omeprazole 20 MG capsule Commonly known as: PRILOSEC TAKE 1 CAPSULE BY MOUTH  DAILY   polyethylene glycol 17 g packet Commonly known as: MIRALAX / GLYCOLAX Take 17 g by mouth daily as needed. Reported on 06/15/2015  Objective:   Physical Exam BP 132/68 (BP Location: Left Arm, Patient Position: Sitting, Cuff Size: Small)   Pulse 61   Temp 97.8 F (36.6 C) (Oral)   Resp 16   Ht '5\' 4"'$  (1.626 m)   Wt 149 lb 4 oz (67.7 kg)   SpO2 96%   BMI 25.62 kg/m  General:   Well developed, NAD, BMI noted. HEENT:  Normocephalic . Face symmetric, atraumatic Neck: No TTP, range of motion seems normal MSK: Area of pain at the left upper trapezoid is no TTP. Shoulder range of motion: Symmetric, seems normal, did not trigger pain Lungs:  CTA B Normal respiratory effort, no intercostal retractions, no accessory muscle use. Heart: RRR,  no murmur.  Lower extremities: no pretibial edema bilaterally  Skin: Not pale. Not jaundice Neurologic:  alert & oriented X3.  Speech normal, gait appropriate for  age and unassisted Psych--  Cognition and judgment appear intact.  Cooperative with normal attention span and concentration.  Behavior appropriate. No anxious or depressed appearing.      Assessment     Assessment HTN Hyperlipidemia Leg cramps-- lipitor d/c in the past, cramps decreased, back on lipitor as cramps are  mild and benefits > risks. CoQ10 helps, CKs normal. rx robaxin 03-2015 Prostate cancer-- prostatectomy 05-2007, urology stopped f/u,  PSA checked by PCP SKIN: SCC MElanoma, R leg , excision~ July 2020 [facility] GI: GERD,  --Dysphagia-- Rx  EGD 02-2014 (declined,sx chronic, mild) --H. pylori positive, re-checked breathing test negative 06-2015 --Chronic mild constipation, fecal impaction 2012 (admited per pt) and 01-2015 (ER): on MiraLAX qd, senokot or lactulose prn HOH   CP- saw cards, (-) stress test (EET) 10-2015   PLAN: Chest pain: As described above, atypical for CAD, pericarditis, aortic disease.  Rec observation, red flag symptoms that should trigger immediate call discussed with the patient. L trapezoid pain: Likely sprain, happened after he moved some furniture.  Recommend Tylenol, heating pad.  Call if no better.  This visit occurred during the SARS-CoV-2 public health emergency.  Safety protocols were in place, including screening questions prior to the visit, additional usage of staff PPE, and extensive cleaning of exam room while observing appropriate contact time as indicated for disinfecting solutions.

## 2020-08-22 NOTE — Patient Instructions (Signed)
Tylenol  500 mg OTC 2 tabs a day every 8 hours as needed for pain  Heating pad  Call if no better in few days   Watch for different symptoms

## 2020-08-22 NOTE — Assessment & Plan Note (Signed)
Chest pain: As described above, atypical for CAD, pericarditis, aortic disease.  Rec observation, red flag symptoms that should trigger immediate call discussed with the patient. L trapezoid pain: Likely sprain, happened after he moved some furniture.  Recommend Tylenol, heating pad.  Call if no better.

## 2020-09-03 ENCOUNTER — Encounter: Payer: Self-pay | Admitting: Internal Medicine

## 2020-09-03 ENCOUNTER — Ambulatory Visit (INDEPENDENT_AMBULATORY_CARE_PROVIDER_SITE_OTHER): Payer: Medicare Other | Admitting: Internal Medicine

## 2020-09-03 ENCOUNTER — Other Ambulatory Visit: Payer: Self-pay

## 2020-09-03 VITALS — BP 122/82 | HR 69 | Temp 98.0°F | Resp 18 | Ht 64.0 in | Wt 150.2 lb

## 2020-09-03 DIAGNOSIS — Z8546 Personal history of malignant neoplasm of prostate: Secondary | ICD-10-CM | POA: Diagnosis not present

## 2020-09-03 DIAGNOSIS — R739 Hyperglycemia, unspecified: Secondary | ICD-10-CM | POA: Diagnosis not present

## 2020-09-03 DIAGNOSIS — Z0000 Encounter for general adult medical examination without abnormal findings: Secondary | ICD-10-CM | POA: Diagnosis not present

## 2020-09-03 LAB — HEMOGLOBIN A1C: Hgb A1c MFr Bld: 5.4 % (ref 4.6–6.5)

## 2020-09-03 LAB — PSA: PSA: 0 ng/mL — ABNORMAL LOW (ref 0.10–4.00)

## 2020-09-03 NOTE — Assessment & Plan Note (Signed)
--  Td 2013 - pneumonia shot 2009, 2019;  prevnar 2015. Completed  -zostavax 2-09;  s/p  shingrex x 2. Completed  -Had Covid vaccinations x 4 -Flu shot rec q. year --Cscope 10-08, Adenomatous polyp, repeated  cscope again  03-2011  Normal, was rec no f/u --History of prostate cancer, checking a PSA.    --Diet, exercise: counseled -Labs reviewed, will check A1c, PSA - POA on file

## 2020-09-03 NOTE — Assessment & Plan Note (Signed)
Here for CPX HTN: Ambulatory BPs normal, no change, last BMP satisfactory Hyperlipidemia: Not fasting, last FLP okay, recheck on RTC. Skin cancer: Sees dermatology regularly GERD: Currently no symptoms. Constipation: Stopped MiraLAX because did not help, lactulose every third day does work , RF prn. Encourage diet  rich in fruits and vegetables or a supplement such as Metamucil.  He likes to try a probiotic as well. Chest pain: See last visit, atypical, random, last less than 20 seconds.  Observation RTC 4 months

## 2020-09-03 NOTE — Patient Instructions (Addendum)
Check the  blood pressure regularly BP GOAL is between 110/65 and  135/85. If it is consistently higher or lower, let me know   For constipation: Lactulose as needed, try Metamucil capsules daily with food and fluids, probiotics are okay   GO TO THE LAB : Get the blood work     [place], [place] back for   a checkup fasting in 4 months  Get a flu shot every fall

## 2020-09-03 NOTE — Progress Notes (Signed)
Subjective:    Patient ID: Michael Johns, male    DOB: [birth year], 87 y.o.   MRN: [id number]  DOS:  09/03/2020 Type of visit - description: CPX  Since the last office visit, he is doing well. Has no major concerns today. Still has random "20 seconds or less" chest pains on and off.   Review of Systems  Other than above, a 14 point review of systems is negative      Past Medical History:  Diagnosis Date   Abdominal pain 11/09   due to constipation and ?pancreatitis- admitted   ADENOCARCINOMA, PROSTATE 07/01/2006   Atypical chest pain 04/01/2015   Constipation    chronic, impaction ~2012 (admited) and 01-2015   GERD (gastroesophageal reflux disease)    Gout    see 09-2008 note   Hearing decreased    right, 40% x years   HYPERLIPIDEMIA, MILD 07/18/2009   HYPERTENSION 05/19/2006   Melanoma ([place])     Past Surgical History:  Procedure Laterality Date   APPENDECTOMY     cataracts Bilateral 2014   lens implantinfo scanned (02-09-17)   INGUINAL HERNIA REPAIR Right 11/2007   MELANOMA EXCISION Right 05/12/2018   R leg melanoma. Dr  Winifred Olive at the [facility] in [place].   POLYPECTOMY     PROSTATECTOMY  05/2007   LADS (-) Bone scan (-)   Rt middle ear surgery     sinus polyp removed     TONSILLECTOMY     Family History  Problem Relation Age of Onset   Hypertension Mother    Diabetes Mother    Heart disease Mother        CABG 39 y/o   Esophageal cancer Father    Heart disease Maternal Grandmother        MI   Leukemia Maternal Grandfather    Cancer Other        all paternal uncles-types unknown   Prostate cancer Neg Hx    Colon cancer Neg Hx    Rectal cancer Neg Hx    Stomach cancer Neg Hx     Allergies as of 09/03/2020       Reactions   Cephalexin Hives        Medication List        Accurate as of September 03, 2020  9:40 PM. If you have any questions, ask your nurse or doctor.          STOP taking these medications    polyethylene  glycol 17 g packet Commonly known as: MIRALAX / GLYCOLAX Stopped by: Kathlene November, MD       TAKE these medications    amLODipine 5 MG tablet Commonly known as: NORVASC TAKE 1 TABLET BY MOUTH  DAILY   atorvastatin 10 MG tablet Commonly known as: LIPITOR TAKE 1 TABLET BY MOUTH  DAILY   Enulose 10 GM/15ML Soln Generic drug: lactulose (encephalopathy) TAKE 45 MLS BY MOUTH DAILY  AS NEEDED   hydrochlorothiazide 12.5 MG tablet Commonly known as: HYDRODIURIL TAKE 1 TABLET BY MOUTH  DAILY   metoprolol succinate 25 MG 24 hr tablet Commonly known as: TOPROL-XL TAKE 1 TABLET BY MOUTH  DAILY   multivitamin tablet Take 1 tablet by mouth M,W,F   omeprazole 20 MG capsule Commonly known as: PRILOSEC TAKE 1 CAPSULE BY MOUTH  DAILY           Objective:   Physical Exam BP 122/82 (BP Location: Left Arm, Patient Position: Sitting, Cuff Size:  Small)   Pulse 69   Temp 98 F (36.7 C) (Oral)   Resp 18   Ht '5\' 4"'$  (1.626 m)   Wt 150 lb 4 oz (68.2 kg)   SpO2 98%   BMI 25.79 kg/m  General: Well developed, NAD, BMI noted Neck: No  thyromegaly  HEENT:  Normocephalic . Face symmetric, atraumatic Lungs:  CTA B Normal respiratory effort, no intercostal retractions, no accessory muscle use. Heart: RRR,  no murmur.  Abdomen:  Not distended, soft, non-tender. No rebound or rigidity.   Lower extremities: no pretibial edema bilaterally  Skin: Exposed areas without rash. Not pale. Not jaundice Neurologic:  alert & oriented X3.  Speech normal, gait appropriate for age and unassisted Strength symmetric and appropriate for age.  Psych: Cognition and judgment appear intact.  Cooperative with normal attention span and concentration.  Behavior appropriate. No anxious or depressed appearing.     Assessment    Assessment HTN Hyperlipidemia Leg cramps-- lipitor d/c in the past, cramps decreased, back on lipitor as cramps are  mild and benefits > risks. CoQ10 helps, CKs normal. rx robaxin  03-2015 Prostate cancer-- prostatectomy 05-2007, urology stopped f/u,  PSA checked by PCP SKIN: SCC MElanoma, R leg , excision~ July 2020 [facility] GI: GERD,  --Dysphagia-- Rx  EGD 02-2014 (declined,sx chronic, mild) --H. pylori positive, re-checked breathing test negative 06-2015 --Chronic mild constipation, fecal impaction 2012 (admited per pt) and 01-2015 (ER): on MiraLAX qd, senokot or lactulose prn HOH   CP- saw cards, (-) stress test (EET) 10-2015   PLAN: Here for CPX HTN: Ambulatory BPs normal, no change, last BMP satisfactory Hyperlipidemia: Not fasting, last FLP okay, recheck on RTC. Skin cancer: Sees dermatology regularly GERD: Currently no symptoms. Constipation: Stopped MiraLAX because did not help, lactulose every third day does work , RF prn. Encourage diet  rich in fruits and vegetables or a supplement such as Metamucil.  He likes to try a probiotic as well. Chest pain: See last visit, atypical, random, last less than 20 seconds.  Observation RTC 4 months     This visit occurred during the SARS-CoV-2 public health emergency.  Safety protocols were in place, including screening questions prior to the visit, additional usage of staff PPE, and extensive cleaning of exam room while observing appropriate contact time as indicated for disinfecting solutions.

## 2020-10-23 ENCOUNTER — Encounter: Payer: Self-pay | Admitting: Internal Medicine

## 2020-11-01 ENCOUNTER — Ambulatory Visit (INDEPENDENT_AMBULATORY_CARE_PROVIDER_SITE_OTHER): Payer: Medicare Other | Admitting: Pharmacist

## 2020-11-01 DIAGNOSIS — E782 Mixed hyperlipidemia: Secondary | ICD-10-CM

## 2020-11-01 DIAGNOSIS — I10 Essential (primary) hypertension: Secondary | ICD-10-CM

## 2020-11-01 DIAGNOSIS — K219 Gastro-esophageal reflux disease without esophagitis: Secondary | ICD-10-CM

## 2020-11-01 DIAGNOSIS — K5909 Other constipation: Secondary | ICD-10-CM

## 2020-11-01 NOTE — Patient Instructions (Signed)
Visit Information  PATIENT GOALS:  Goals Addressed             This Visit's Progress    Chronic Care Management Pharmacy Care Plan   On track    CARE PLAN ENTRY (see longitudinal plan of care for additional care plan information)  Current Barriers:  Chronic Disease Management support, education, and care coordination needs related to Hypertension, Hyperlipidemia, GERD, Chronic Constipation   Hypertension BP Readings from Last 3 Encounters:  09/03/20 122/82  08/22/20 132/68  08/01/20 (!) 162/70  Pharmacist Clinical Goal(s): Over the next 90 days, patient will work with PharmD and providers to maintain BP goal <140/90 Current regimen:  Amlodipine 5mg  daily Hydrochlorothiazide 12.5mg  daily  Metoprolol succinate 25mg  daily  Interventions: Discussed blood pressure  goals and recent home BP readings Patient self care activities - Over the next 90 days, patient will: Check blood pressure 2-3 times per week, document, and provide at future appointments Ensure daily salt intake < 2300 mg/day  Hyperlipidemia Lab Results  Component Value Date/Time   LDLCALC 88 02/28/2019 08:20 AM   LDLDIRECT 82.0 03/06/2020 01:57 PM  Pharmacist Clinical Goal(s): Over the next 90 days, patient will work with PharmD and providers to maintain LDL goal < 100 Current regimen:  Atorvastatin 10mg  daily  Interventions: none Patient self care activities - Over the next 90 days, patient will: Maintain cholesterol medication regimen.   GERD Pharmacist Clinical Goal(s) Over the next 90 days, patient will work with PharmD and providers to reduce symptoms of GERD and polypharmacy Current regimen:  Omeprazole 20mg  every other day Interventions: none Patient self care activities - Over the next 90 days, patient will: Continue omeprazole 20mg  daily   Chronic Constipation Pharmacist Clinical Goal(s) Over the next 90 days, patient will work with PharmD and providers to reduce symptoms of  constipation Current regimen:  Miralax 17 gram every other day Lactulose 61ml every other day Metamucil tablets 1 tablet daily Interventions: Discussed bowel regimen. Rare issues with constipation with Miralax daily and lactulose as needed if no BM in 3 days.  Patient self care activities - Over the next 90 days, patient will: Recommend trial of Metamucil twice a day (can take up to 5 per day) to see if can decrease frequency of lactulose  Health Maintenance:  Reviewed vaccination history and discussed benefits of COVID booster Patient to get the following vaccines at next provider appointment, local pharmacy or [place]: COVID booster (patient is checking his records. He thinks received COVID booster at [facility] last month)   Medication management Pharmacist Clinical Goal(s): Over the next 90 days, patient will work with PharmD and providers to maintain optimal medication adherence Current pharmacy: [facility] Interventions Comprehensive medication review performed. Continue current medication management strategy Patient self care activities - Over the next 90 days, patient will: Focus on medication adherence by filling and taking medications appropriately  Take medications as prescribed Report any questions or concerns to PharmD and/or provider(s)  Please see past updates related to this goal by clicking on the "Past Updates" button in the selected goal          The patient verbalized understanding of instructions, educational materials, and care plan provided today and declined offer to receive copy of patient instructions, educational materials, and care plan.   Telephone follow up appointment with care management team member scheduled for: 6 months  Cherre Robins, PharmD Clinical Pharmacist [place] [facility]

## 2020-11-01 NOTE — Chronic Care Management (AMB) (Signed)
Chronic Care Management Pharmacy Note  11/01/2020  Name:  Michael Johns MRN:  [id number] DOB:  1932/11/21  Subjective: Michael Johns is an 87 y.o. year old male who is a primary patient of Paz, Alda Berthold, MD.  The CCM team was consulted for assistance with disease management and care coordination needs.    Engaged with patient by telephone for follow up visit in response to provider referral for pharmacy case management and/or care coordination services.   Consent to Services:  The patient was given information about Chronic Care Management services, agreed to services, and gave verbal consent prior to initiation of services.  Please see initial visit note for detailed documentation.   Patient Care Team: Colon Branch, MD as PCP - General Inda Castle, MD (Inactive) as Consulting Physician (Gastroenterology) Vevelyn Royals, MD as Consulting Physician (Ophthalmology) Izora Gala, MD as Consulting Physician (Otolaryngology)  Recent office visits: 09/03/2020 - PCP (Dr Larose Kells) Annual physical. Stopped Miralax due to ineffective. Continue Lactulose every 3 days. Recommended fiber supplement like Metamucil and probiotic daily.  08/22/2020 - PCP (Dr Larose Kells) Acute visit for CP with history of moving furniture recently. Recommended Tylenol an dheating pad  Recent consult visits: 05/09/2020 ophthalmology (Dr Antionette Fairy - [facility]) [facility] visits: 08/01/2020 ED Visit for chronic constipation. Seen at [place] ED. Received Fleets enema with improved symptom / BM. Abdominal series and CT abdomen showed no acute issues. No med changes at discharge.     Objective:  Lab Results  Component Value Date   CREATININE 1.08 08/01/2020   CREATININE 1.26 03/06/2020   CREATININE 1.11 08/29/2019    Lab Results  Component Value Date   HGBA1C 5.4 09/03/2020   Last diabetic Eye exam: No results found for: HMDIABEYEEXA  Last diabetic Foot exam: No results found for: HMDIABFOOTEX       Component Value Date/Time   CHOL 161 03/06/2020 1357   TRIG 250.0 (H) 03/06/2020 1357   HDL 51.20 03/06/2020 1357   CHOLHDL 3 03/06/2020 1357   VLDL 50.0 (H) 03/06/2020 1357   LDLCALC 88 02/28/2019 0820   LDLDIRECT 82.0 03/06/2020 1357    Hepatic Function Latest Ref Rng & Units 03/06/2020 02/28/2019 02/09/2018  Total Protein 6.0 - 8.3 g/dL 7.6 7.2 -  Albumin 3.5 - 5.2 g/dL 4.4 4.2 -  AST 0 - 37 U/L '22 22 22  ' ALT 0 - 53 U/L '18 16 20  ' Alk Phosphatase 39 - 117 U/L 65 73 -  Total Bilirubin 0.2 - 1.2 mg/dL 0.6 0.7 -    Lab Results  Component Value Date/Time   TSH 3.48 03/06/2020 01:57 PM   TSH 3.71 08/29/2019 08:27 AM   FREET4 0.72 03/06/2020 01:57 PM   FREET4 0.74 02/09/2018 08:16 AM    CBC Latest Ref Rng & Units 08/01/2020 08/29/2019 08/25/2018  WBC 4.0 - 10.5 K/uL 9.0 8.9 7.4  Hemoglobin 13.0 - 17.0 g/dL 14.6 13.8 14.4  Hematocrit 39.0 - 52.0 % 39.8 39.9 41.5  Platelets 150 - 400 K/uL 233 259.0 266.0    Lab Results  Component Value Date/Time   VD25OH 49 05/06/2013 09:22 AM    Clinical ASCVD: No  The ASCVD Risk score (Arnett DK, et al., 2019) failed to calculate for the following reasons:   The 2019 ASCVD risk score is only valid for ages 59 to 36     Social History   Tobacco Use  Smoking Status Never  Smokeless Tobacco Never   BP Readings  from Last 3 Encounters:  09/03/20 122/82  08/22/20 132/68  08/01/20 (!) 162/70   Pulse Readings from Last 3 Encounters:  09/03/20 69  08/22/20 61  08/01/20 66   Wt Readings from Last 3 Encounters:  09/03/20 150 lb 4 oz (68.2 kg)  08/22/20 149 lb 4 oz (67.7 kg)  08/01/20 149 lb (67.6 kg)    Assessment: Review of patient past medical history, allergies, medications, health status, including review of consultants reports, laboratory and other test data, was performed as part of comprehensive evaluation and provision of chronic care management services.   SDOH:  (Social Determinants of Health) assessments and interventions  performed:  SDOH Interventions    Flowsheet Row Most Recent Value  SDOH Interventions   Financial Strain Interventions Intervention Not Indicated  Transportation Interventions Intervention Not Indicated       CCM Care Plan  Allergies  Allergen Reactions   Cephalexin Hives    Medications Reviewed Today     Reviewed by Cherre Robins, PharmD (Pharmacist) on 11/01/20 at 68  Med List Status: <None>   Medication Order Taking? Sig Documenting Provider Last Dose Status Informant  amLODipine (NORVASC) 5 MG tablet [id number] Yes TAKE 1 TABLET BY MOUTH  DAILY Colon Branch, MD Taking Active   atorvastatin (LIPITOR) 10 MG tablet [id number] Yes TAKE 1 TABLET BY MOUTH  DAILY Colon Branch, MD Taking Active   ENULOSE 10 GM/15ML SOLN [id number] Yes TAKE 45 MLS BY MOUTH DAILY  AS NEEDED Colon Branch, MD Taking Active            Med Note Jonathon Jordan Nov 01, 2020  1:07 PM) Taking every other day  hydrochlorothiazide (HYDRODIURIL) 12.5 MG tablet [id number] Yes TAKE 1 TABLET BY MOUTH  DAILY Colon Branch, MD Taking Active   metoprolol succinate (TOPROL-XL) 25 MG 24 hr tablet [id number] Yes TAKE 1 TABLET BY MOUTH  DAILY Colon Branch, MD Taking Active   Multiple Vitamin (MULTIVITAMIN) tablet [id number] Yes Take 1 tablet by mouth M,W,F [provider] Taking Active   omeprazole (PRILOSEC) 20 MG capsule [id number] Yes TAKE 1 CAPSULE BY MOUTH  DAILY Colon Branch, MD Taking Active   polyethylene glycol (MIRALAX / GLYCOLAX) 17 g packet [id number] Yes Take 17 g by mouth every other day. [provider] Taking Active   psyllium (REGULOID) 0.52 g capsule [id number] Yes Take 0.52 g by mouth daily. [provider] Taking Active   vitamin B-12 (CYANOCOBALAMIN) 500 MCG tablet [id number] Yes Take 500 mcg by mouth. On Tuesdays, Thursdays, Saturdays and Sundays [provider] Taking Active             Patient Active Problem List   Diagnosis Date Noted   GERD (gastroesophageal  reflux disease) 02/28/2019   Melanoma of skin ([place]) 04/14/2018   Atypical chest pain 04/01/2015   PCP NOTES >>>>>>>>>>>>>>>>>>>> 11/09/2014   DJD (degenerative joint disease) 05/06/2013   Dysphagia 04/23/2012   Annual physical exam 04/16/2011   Chronic constipation 07/22/2010   Hyperlipidemia 07/18/2009   ADENOCARCINOMA, PROSTATE 07/01/2006   PROSTATECTOMY, HX OF 07/01/2006   Essential hypertension 05/19/2006    Immunization History  Administered Date(s) Administered   Influenza Split 10/30/2010, 10/29/2011   Influenza Whole 11/11/2006, 11/17/2007, 10/25/2008, 10/30/2009   Influenza, High Dose Seasonal PF 10/18/2015   Influenza,inj,Quad PF,6+ Mos 10/20/2012, 10/19/2013, 10/11/2014, 09/08/2018   Influenza-Unspecified 09/17/2016, 10/27/2017, 09/27/2019, 10/04/2020   PFIZER Comirnaty(Gray Top)Covid-19 Tri-Sucrose Vaccine 05/03/2020   PFIZER(Purple Top)SARS-COV-2 Vaccination 02/09/2019, 03/01/2019,  10/24/2019   Pfizer Covid-19 Vaccine Bivalent Booster 49yr & up 10/08/2020   Pneumococcal Conjugate-13 09/07/2013   Pneumococcal Polysaccharide-23 05/05/2001, 10/15/2007, 08/06/2017   Td 03/29/2001   Tdap 04/16/2011   Zoster Recombinat (Shingrix) 09/17/2016, 01/26/2017   Zoster, Live 03/10/2007    Conditions to be addressed/monitored: HTN, HLD and GERD; chronic hyponatremia (mild), Chronic constipation  Patient Care Plan: General Pharmacy (Adult)     Problem Identified: Chronic Care and Medication Management   Priority: Medium  Onset Date: 05/02/2020  Note:   Current Barriers:  none  Pharmacist Clinical Goal(s):  Over the next 180 days, patient will maintain control of HTN, GERD and hyperlipidemia as evidenced by meeting goals stated below  through collaboration with PharmD and provider.   Interventions: 1:1 collaboration with PColon Branch MD regarding development and update of comprehensive plan of care as evidenced by provider attestation and co-signature Inter-disciplinary  care team collaboration (see longitudinal plan of care) Comprehensive medication review performed; medication list updated in electronic medical record  Hypertension:  Controlled; current treatment:amlodipine 56mdaily; metoprolol succinate 2555maily and HTCZ 12.5mg35mily ;   Current home readings: was 118/63 today; per patient this BP is similar to other recent home BP readings.   Denies hypotensive/hypertensive symptoms  Recommended continue current therapy for HTN  Hyperlipidemia:  Controlled; current treatment:atorvastatin 10mg32mly    Had complained about possible muscle aches with atorvastatin but patient states they are mild and doesn't mind continuing atorvastatin .    Counseled on importance of exercise and limiting fat in diet for heart health Recommended continue current lipid lowering medication  GERD:  Currently controlled with omeprazole 20mg 62my.  Denies breakthrough reflux symptoms Recommend continue current therapy  Chronic constipation:  Currently well controlled per patient  Current therapy is Miralax 17 gram daily and Lactulose 45ml a28meded (patient will take if no BM in 3 days).  Recommend continue current bowel regimen.    Patient Goals/Self-Care Activities Over the next 180 days, patient will:  take medications as prescribed and check blood pressure 2 to 3 times per week, document, and provide at future appointments  Follow Up Plan: Telephone follow up appointment with care management team member scheduled for:  6 months     Long-Range Goal: HTN; Hyperlipidemia; GERD; chrnonic constipation   Start Date: 05/02/2020  Recent Progress: On track  Priority: Medium  Note:   Current Barriers:  none  Pharmacist Clinical Goal(s):  Over the next 180 days, patient will maintain control of HTN, GERD and hyperlipidemia as evidenced by meeting goals stated below  through collaboration with PharmD and provider.   Interventions: 1:1 collaboration with Paz, JoColon Branchgarding development and update of comprehensive plan of care as evidenced by provider attestation and co-signature Inter-disciplinary care team collaboration (see longitudinal plan of care) Comprehensive medication review performed; medication list updated in electronic medical record  Hypertension: Controlled;  current treatment:amlodipine 5mg dai33m metoprolol succinate 25mg dai86mnd hydrochlorothiazide 12.5mg daily49m Current home readings: 120's / 60's (occasionally has SBP 130) Denies hypotensive/hypertensive symptoms Recommended continue current therapy for hypertension Reviewed blood pressure goals  Hyperlipidemia: Controlled Current treatment:atorvastatin 10mg daily62mad complained about possible muscle aches with atorvastatin but patient states they are mild and doesn't mind continuing atorvastatin .   Current exercise: not much; golfs twice a week Counseled on importance of exercise and limiting fat in diet for heart health Recommended continue current lipid lowering medication  GERD:  Currently controlled with  omeprazole 41m daily.  Denies breakthrough reflux symptoms Recommend continue current therapy  Chronic constipation:  Currently well controlled per patient  He did have ER visit due to constipation Current therapy Miralax 17 gram every other day Lactulose 48mevery other day Metamucil tablets 1 tablet daily Recommend trail of Metamucil twice a day (can take up to 5 per day) to see if can decrease frequency of lactulose   Patient Goals/Self-Care Activities Over the next 180 days, patient will:  take medications as prescribed and check blood pressure 2 to 3 times per week, document, and provide at future appointments  Follow Up Plan: Telephone follow up appointment with care management team member scheduled for:  6 months       Medication Assistance: None required.  Patient affirms current coverage meets needs.   Reviewed and updated  medication list  [facility]:  [facility]  ([facility]inklevilleu[address] [place]hone: [phone]: [address]Su[place]lyi[place]ason7[address]hone: [phone]: [phone] Follow Up:  Patient agrees to Care Plan and Follow-up.  Plan: No medication changes recommended today  Telephone follow up appointment with care management team member scheduled for:   6 months  TaCherre RobinsPharmD Clinical Pharmacist [facility]

## 2020-11-26 DIAGNOSIS — E782 Mixed hyperlipidemia: Secondary | ICD-10-CM | POA: Diagnosis not present

## 2020-11-26 DIAGNOSIS — I10 Essential (primary) hypertension: Secondary | ICD-10-CM | POA: Diagnosis not present

## 2020-12-03 ENCOUNTER — Telehealth: Payer: Medicare Other | Admitting: Family

## 2021-01-04 ENCOUNTER — Ambulatory Visit (INDEPENDENT_AMBULATORY_CARE_PROVIDER_SITE_OTHER): Payer: Medicare Other | Admitting: Internal Medicine

## 2021-01-04 ENCOUNTER — Encounter: Payer: Self-pay | Admitting: Internal Medicine

## 2021-01-04 VITALS — BP 122/68 | HR 65 | Temp 98.4°F | Resp 16 | Ht 64.0 in | Wt 152.1 lb

## 2021-01-04 DIAGNOSIS — E782 Mixed hyperlipidemia: Secondary | ICD-10-CM | POA: Diagnosis not present

## 2021-01-04 DIAGNOSIS — I10 Essential (primary) hypertension: Secondary | ICD-10-CM

## 2021-01-04 LAB — BASIC METABOLIC PANEL
BUN: 20 mg/dL (ref 6–23)
CO2: 29 mEq/L (ref 19–32)
Calcium: 9.6 mg/dL (ref 8.4–10.5)
Chloride: 98 mEq/L (ref 96–112)
Creatinine, Ser: 1.19 mg/dL (ref 0.40–1.50)
GFR: 55.23 mL/min — ABNORMAL LOW (ref >60.00)
Glucose, Bld: 97 mg/dL (ref 70–99)
Potassium: 3.7 mEq/L (ref 3.5–5.1)
Sodium: 134 mEq/L — ABNORMAL LOW (ref 135–145)

## 2021-01-04 LAB — LIPID PANEL
Cholesterol: 137 mg/dL (ref 0–200)
HDL: 44 mg/dL (ref >39.00)
LDL Cholesterol: 66 mg/dL (ref 0–99)
NonHDL: 92.7
Total CHOL/HDL Ratio: 3
Triglycerides: 132 mg/dL (ref 0.0–149.0)
VLDL: 26.4 mg/dL (ref 0.0–40.0)

## 2021-01-04 NOTE — Progress Notes (Signed)
Subjective:    Patient ID: Michael Johns, male    DOB: November 18, 1932, 87 y.o.   MRN: [id number]  DOS:  01/04/2021 Type of visit - description: Follow-up  Since the last office visit he is doing well. We reviewed together his previous labs. Good medication compliance.  Denies chest pain or difficulty breathing No nausea or vomiting. 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 Dr  Winifred Olive at the [facility] in [place].   POLYPECTOMY     PROSTATECTOMY  05/2007   LADS (-) Bone scan (-)   Rt middle ear surgery     sinus polyp removed     TONSILLECTOMY      Allergies as of 01/04/2021       Reactions   Cephalexin Hives        Medication List        Accurate as of January 04, 2021  8:24 AM. If you have any questions, ask your nurse or doctor.          amLODipine 5 MG tablet Commonly known as: NORVASC TAKE 1 TABLET BY MOUTH  DAILY   atorvastatin 10 MG tablet Commonly known as: LIPITOR TAKE 1 TABLET BY MOUTH  DAILY   Enulose 10 GM/15ML Soln Generic drug: lactulose (encephalopathy) TAKE 45 MLS BY MOUTH DAILY  AS NEEDED   hydrochlorothiazide 12.5 MG tablet Commonly known as: HYDRODIURIL TAKE 1 TABLET BY MOUTH  DAILY   metoprolol succinate 25 MG 24 hr  tablet Commonly known as: TOPROL-XL TAKE 1 TABLET BY MOUTH  DAILY   multivitamin tablet Take 1 tablet by mouth M,W,F   omeprazole 20 MG capsule Commonly known as: PRILOSEC TAKE 1 CAPSULE BY MOUTH  DAILY   polyethylene glycol 17 g packet Commonly known as: MIRALAX / GLYCOLAX Take 17 g by mouth every other day.   psyllium 0.52 g capsule Commonly known as: REGULOID Take 0.52 g by mouth daily.   vitamin B-12 500 MCG tablet Commonly known as: CYANOCOBALAMIN Take 500 mcg by mouth. On Tuesdays, Thursdays, Saturdays and Sundays           Objective:   Physical Exam BP 122/68 (BP Location: Left Arm, Patient Position: Sitting, Cuff Size: Small)   Pulse 65   Temp 98.4 F (36.9 C) (Oral)   Resp 16   Ht 5\' 4"  (1.626 m)   Wt 152 lb 2 oz (69 kg)   SpO2 98%   BMI 26.11 kg/m  General:   Well developed, NAD, BMI noted. HEENT:  Normocephalic . Face symmetric, atraumatic Lungs:  CTA B Normal respiratory effort, no intercostal retractions, no accessory muscle use. Heart: RRR,  no murmur.  Lower extremities: no pretibial edema bilaterally  Skin: Not  pale. Not jaundice Neurologic:  alert & oriented X3.  Speech normal, gait appropriate for age and unassisted Psych--  Cognition and judgment appear intact.  Cooperative with normal attention span and concentration.  Behavior appropriate. No anxious or depressed appearing.      Assessment     Assessment HTN Hyperlipidemia Leg cramps-- lipitor d/c in the past, cramps decreased, back on lipitor as cramps are  mild and benefits > risks. CoQ10 helps, CKs normal. rx robaxin 03-2015 Prostate cancer-- prostatectomy 05-2007, urology stopped f/u,  PSA checked by PCP SKIN: SCC MElanoma, R leg , excision~ July 2020 DJD GI: GERD,  --Dysphagia-- Rx  EGD 02-2014 (declined,sx chronic, mild) --H. pylori positive, re-checked breathing test negative 06-2015 --Chronic mild constipation, fecal impaction 2012 (admited per pt) and 01-2015 (ER): on  MiraLAX qd, senokot or lactulose prn HOH   CP- saw cards, (-) stress test (EET) 10-2015 Covid + ~ 10-2020, no Rx   PLAN: HTN: Seems well controlled.  Continue amlodipine, HCTZ, metoprolol.  Has chronic hyponatremia, check BMP. High cholesterol: On Lipitor, check FLP, he is not fasting.  Encouraged to remain active even during the winter. Preventive care: Up-to-date on immunizations.  Patient is aware that if he gets a viral illness we have the ability to treat him but needs to come early because there is a window of opportunity for treatment. RTC 6 months    This visit occurred during the SARS-CoV-2 public health emergency.  Safety protocols were in place, including screening questions prior to the visit, additional usage of staff PPE, and extensive cleaning of exam room while observing appropriate contact time as indicated for disinfecting solutions.

## 2021-01-04 NOTE — Patient Instructions (Addendum)
Check the  blood pressure regularlly BP GOAL is between 110/65 and  135/85. If it is consistently higher or lower, let me know     GO TO THE LAB : Get the blood work     [place], [place] back for   a checkup in 6 months

## 2021-01-21 ENCOUNTER — Other Ambulatory Visit: Payer: Self-pay | Admitting: Internal Medicine

## 2021-02-26 NOTE — Assessment & Plan Note (Signed)
HTN: Seems well controlled.  Continue amlodipine, HCTZ, metoprolol.  Has chronic hyponatremia, check BMP. High cholesterol: On Lipitor, check FLP, he is not fasting.  Encouraged to remain active even during the winter. Preventive care: Up-to-date on immunizations.  Patient is aware that if he gets a viral illness we have the ability to treat him but needs to come early because there is a window of opportunity for treatment. RTC 6 months

## 2021-04-10 ENCOUNTER — Other Ambulatory Visit: Payer: Self-pay | Admitting: Internal Medicine

## 2021-04-15 ENCOUNTER — Encounter: Payer: Self-pay | Admitting: Internal Medicine

## 2021-04-23 ENCOUNTER — Encounter: Payer: Self-pay | Admitting: Internal Medicine

## 2021-04-23 ENCOUNTER — Other Ambulatory Visit: Payer: Self-pay

## 2021-04-23 ENCOUNTER — Ambulatory Visit (INDEPENDENT_AMBULATORY_CARE_PROVIDER_SITE_OTHER): Payer: Medicare Other | Admitting: Internal Medicine

## 2021-04-23 ENCOUNTER — Other Ambulatory Visit (HOSPITAL_BASED_OUTPATIENT_CLINIC_OR_DEPARTMENT_OTHER): Payer: Self-pay

## 2021-04-23 ENCOUNTER — Ambulatory Visit (HOSPITAL_BASED_OUTPATIENT_CLINIC_OR_DEPARTMENT_OTHER)
Admission: RE | Admit: 2021-04-23 | Discharge: 2021-04-23 | Disposition: A | Discharge status: Home / Self Care | Payer: Medicare Other | Source: Ambulatory Visit | Attending: Internal Medicine | Admitting: Internal Medicine

## 2021-04-23 VITALS — BP 126/70 | HR 77 | Temp 97.8°F | Resp 16 | Ht 64.0 in | Wt 151.2 lb

## 2021-04-23 DIAGNOSIS — M25572 Pain in left ankle and joints of left foot: Secondary | ICD-10-CM

## 2021-04-23 LAB — COMPREHENSIVE METABOLIC PANEL
ALT: 28 U/L (ref 0–53)
AST: 27 U/L (ref 0–37)
Albumin: 4.1 g/dL (ref 3.5–5.2)
Alkaline Phosphatase: 112 U/L (ref 39–117)
BUN: 16 mg/dL (ref 6–23)
CO2: 30 mEq/L (ref 19–32)
Calcium: 9.5 mg/dL (ref 8.4–10.5)
Chloride: 96 mEq/L (ref 96–112)
Creatinine, Ser: 1.29 mg/dL (ref 0.40–1.50)
GFR: 50.03 mL/min — ABNORMAL LOW (ref >60.00)
Glucose, Bld: 108 mg/dL — ABNORMAL HIGH (ref 70–99)
Potassium: 4.1 mEq/L (ref 3.5–5.1)
Sodium: 133 mEq/L — ABNORMAL LOW (ref 135–145)
Total Bilirubin: 0.7 mg/dL (ref 0.2–1.2)
Total Protein: 7.4 g/dL (ref 6.0–8.3)

## 2021-04-23 LAB — URIC ACID: Uric Acid, Serum: 5.3 mg/dL (ref 4.0–7.8)

## 2021-04-23 MED ORDER — PREDNISONE 10 MG PO TABS
ORAL_TABLET | ORAL | 0 refills | Status: DC
  Filled 2021-04-23: qty 18, 9d supply, fill #0

## 2021-04-23 MED ORDER — COLCHICINE 0.6 MG PO TABS
ORAL_TABLET | Freq: Two times a day (BID) | ORAL | 0 refills | Status: DC | PRN
  Filled 2021-04-23: qty 60, 30d supply, fill #0

## 2021-04-23 NOTE — Patient Instructions (Signed)
Blood work ? ?Your x-rays downstairs ? ?Prednisone for few days ? ?Start taking colchicine 1 tablet twice a day until better (this is a "painkiller" for gout) ? ?Tylenol  500 mg OTC 2 tabs a day every 8 hours as needed for pain ? ?Ice the area twice daily ? ?Call if not gradually better, call anytime if: High fever, getting worse, symptoms resurface after you stop prednisone. ? ?Avoid eating fish or seafood in the next couple of weeks. ?

## 2021-04-23 NOTE — Progress Notes (Signed)
? ?Subjective:  ? ? Patient ID: Michael Johns, male    DOB: 1933-01-13, 87 y.o.   MRN: [id number] ? ?DOS:  04/23/2021 ?Type of visit - description: Acute ? ?1 day history of left ankle pain and swelling. ?Started after he mildly twisted the ankle.  He denies any major injury, fall. ?Does not recall any fever. ?No skin openings on the area. ?Has a remote history of gout, no problems in years. ?Lately has been eating more fish and had seafood. ? ?Review of Systems ?See above  ? ?Past Medical History:  ?Diagnosis Date  ? Abdominal pain 11/09  ? due to constipation and ?pancreatitis- admitted  ? ADENOCARCINOMA, PROSTATE 07/01/2006  ? Atypical chest pain 04/01/2015  ? Constipation   ? chronic, impaction ~2012 (admited) and 01-2015  ? GERD (gastroesophageal reflux disease)   ? Gout   ? see 09-2008 note  ? Hearing decreased   ? right, 40% x years  ? [place], MILD 07/18/2009  ? HYPERTENSION 05/19/2006  ? Melanoma ([place])   ? ? ?Past Surgical History:  ?Procedure Laterality Date  ? APPENDECTOMY    ? cataracts Bilateral 2014  ? lens implantinfo scanned (02-09-17)  ? INGUINAL HERNIA REPAIR Right 11/2007  ? MELANOMA EXCISION Right 05/12/2018  ? R leg melanoma. Dr  Winifred Olive at the [facility] in [place].  ? POLYPECTOMY    ? PROSTATECTOMY  05/2007  ? LADS (-) Bone scan (-)  ? Rt middle ear surgery    ? sinus polyp removed    ? TONSILLECTOMY    ? ? ?Current Outpatient Medications  ?Medication Instructions  ? amLODipine (NORVASC) 5 MG tablet TAKE 1 TABLET BY MOUTH  DAILY  ? atorvastatin (LIPITOR) 10 MG tablet TAKE 1 TABLET BY MOUTH  DAILY  ? ENULOSE 10 GM/15ML SOLN TAKE 45 MLS BY MOUTH DAILY  AS NEEDED  ? hydrochlorothiazide (HYDRODIURIL) 12.5 MG tablet TAKE 1 TABLET BY MOUTH  DAILY  ? metoprolol succinate (TOPROL-XL) 25 MG 24 hr tablet TAKE 1 TABLET BY MOUTH  DAILY  ? Multiple Vitamin (MULTIVITAMIN) tablet Take 1 tablet by mouth M,W,F  ? omeprazole (PRILOSEC) 20 MG capsule TAKE 1 CAPSULE BY MOUTH  DAILY  ?  polyethylene glycol (MIRALAX / GLYCOLAX) 17 g, Oral, Every other day  ? psyllium (REGULOID) 0.52 g, Oral, Daily  ? vitamin B-12 (CYANOCOBALAMIN) 500 mcg, Oral, On Tuesdays, Thursdays, Saturdays and Sundays  ? ? ?   ?Objective:  ? Physical Exam ?BP 126/70 (BP Location: Left Arm, Patient Position: Sitting, Cuff Size: Small)   Pulse 77   Temp 97.8 ?F (36.6 ?C) (Oral)   Resp 16   Ht '5\' 4"'$  (1.626 m)   Wt 151 lb 4 oz (68.6 kg)   SpO2 96%   BMI 25.96 kg/m?  ?General:   ?Well developed, NAD, BMI noted. ?HEENT:  ?Normocephalic . Face symmetric, atraumatic ? Lower extremities: ?R ankle: Normal ?L ankle: Mildly swollen, more so at the exterior malleolus, slightly TTP; +warm.  No redness, range of motion normal. ?Skin: Not pale. Not jaundice.  No evidence of cellulitis at the lower extremities ?Neurologic:  ?alert & oriented X3.  ?Speech normal, gait: Limping due to L ankle pain ?Psych--  ?Cognition and judgment appear intact.  ?Cooperative with normal attention span and concentration.  ?Behavior appropriate. ?No anxious or depressed appearing.  ? ?   ?Assessment   ? ?  Assessment ?HTN ?Hyperlipidemia ?Leg cramps-- lipitor d/c in the past, cramps decreased, back on lipitor  as cramps are  mild and benefits > risks. CoQ10 helps, CKs normal. rx robaxin 03-2015 ?Prostate cancer-- prostatectomy 05-2007, urology stopped f/u,  PSA checked by PCP ?SKIN: ?---SCC ?---Melanoma, R leg , excision~ July 2020 ?DJD ?GI: ?GERD,  ?--Dysphagia-- Rx  EGD 02-2014 (declined,sx chronic, mild) ?--H. pylori positive, re-checked breathing test negative 06-2015 ?--Chronic mild constipation, fecal impaction 2012 (admited per pt) and 01-2015 (ER): on MiraLAX qd, senokot or lactulose prn ?HOH   ?CP- saw cards, (-) stress test (EET) 10-2015 ?Covid + ~ 10-2020, no Rx ? ? ?PLAN: ?Left ankle pain: ?Acute onset of pain and swelling of the left ankle, history of gout remotely, has been eating more seafood lately.  No fever.  Possibly mild sprain recently. ?DDx  include occult fracture, gout, others (including infection since this is a monoarthritis) ?Plan: ?Check a CMP, uric acid.  Start colchicine twice daily, prednisone for few days.  Tylenol, ice. ?X-ray ?Strongly recommend to let me know if is not gradually better or if he has fever, severe swelling, symptoms resurface after prednisone ends.   ? ? ?This visit occurred during the SARS-CoV-2 public health emergency.  Safety protocols were in place, including screening questions prior to the visit, additional usage of staff PPE, and extensive cleaning of exam room while observing appropriate contact time as indicated for disinfecting solutions.  ? ?

## 2021-04-23 NOTE — Assessment & Plan Note (Signed)
Left ankle pain: ?Acute onset of pain and swelling of the left ankle, history of gout remotely, has been eating more seafood lately.  No fever.  Possibly mild sprain recently. ?DDx include occult fracture, gout, others (including infection since this is a monoarthritis) ?Plan: ?Check a CMP, uric acid.  Start colchicine twice daily, prednisone for few days.  Tylenol, ice. ?X-ray ?Strongly recommend to let me know if is not gradually better or if he has fever, severe swelling, symptoms resurface after prednisone ends.   ?

## 2021-05-02 ENCOUNTER — Ambulatory Visit (INDEPENDENT_AMBULATORY_CARE_PROVIDER_SITE_OTHER): Payer: Medicare Other | Admitting: Pharmacist

## 2021-05-02 DIAGNOSIS — E782 Mixed hyperlipidemia: Secondary | ICD-10-CM

## 2021-05-02 DIAGNOSIS — K219 Gastro-esophageal reflux disease without esophagitis: Secondary | ICD-10-CM

## 2021-05-02 DIAGNOSIS — I10 Essential (primary) hypertension: Secondary | ICD-10-CM

## 2021-05-02 NOTE — Chronic Care Management (AMB) (Signed)
? ? ?Chronic Care Management ?Pharmacy Note ? ?05/02/2021  ?Name:  Michael Johns MRN:  [id number] DOB:  [birth year] ? ?Summary:  ?Slight decrease in GFR. Patient had question about what this means. Explained that decrased was small and he is in CKD 3b stage which is mild to moderate decrease in renal function. Advised to avoid NSAIDS (ibuprofen, naproxen) ; can use Tylenol / acetaminophen is needed for pain reliever, fever reducer or for headaches.  ?Recommended decrease omeprazole 1m - take 1 capsule every OTHER day since he has not had reflux symptoms lately. ?Recent gout episode resolved. Discussed limiting foods that can increase gout - provided dietary information thru AVS / My Chart.  ? ?Subjective: ?Michael DERSHEMis an 88y.o. year old male who is a primary patient of Paz, JAlda Berthold MD.  The CCM team was consulted for assistance with disease management and care coordination needs.   ? ?Engaged with patient by telephone for follow up visit in response to provider referral for pharmacy case management and/or care coordination services.  ? ?Consent to Services:  ?The patient was given information about Chronic Care Management services, agreed to services, and gave verbal consent prior to initiation of services.  Please see initial visit note for detailed documentation.  ? ?Patient Care Team: ?PColon Branch MD as PCP - General ?KInda Castle MD (Inactive) as Consulting Physician (Gastroenterology) ?SVevelyn Royals MD as Consulting Physician (Ophthalmology) ?RIzora Gala MD as Consulting Physician (Otolaryngology) ? ?Recent office visits: ?04/23/2021 - Int Med (Dr PLarose [facility] visit for left ankle pain. Sudden onset. Remote history of gout. Labs checked. Prescribed colchicine 0.659mtwice a day and prednisone 1068m take 3 tabs daiy for 3 days, 2 tabs for 3 days and then 1 tablet dialy for 3 days.  ?01/04/2021 - Int Med (Dr PazLarose Kellsollow up chronic conditions.  ? ? ?Recent consult visits: ?12/25/2021 - ENT (Dr  RosConstance Holstereen for cerumen impaction. Cerumen removed. No infeciton seen.  ?05/09/2020 [facility]am ? ?Hospital visits: ?None in the last 6 months.  ? ?Objective: ? ?Lab Results  ?Component Value Date  ? CREATININE 1.29 04/23/2021  ? CREATININE 1.19 01/04/2021  ? CREATININE 1.08 08/01/2020  ? ? ?Lab Results  ?Component Value Date  ? HGBA1C 5.4 09/03/2020  ? ?Last diabetic Eye exam: No results found for: HMDIABEYEEXA  ?Last diabetic Foot exam: No results found for: HMDIABFOOTEX  ? ?   ?Component Value Date/Time  ? CHOL 137 01/04/2021 0836  ? TRIG 132.0 01/04/2021 0836  ? HDL 44.00 01/04/2021 0836  ? CHOLHDL 3 01/04/2021 0836  ? VLDL 26.4 01/04/2021 0836  ? LDLE[place] 01/04/2021 0836  ? LDLDIRECT 82.0 03/06/2020 1357  ? ? ? ?  Latest Ref Rng & Units 04/23/2021  ? 10:20 AM 03/06/2020  ?  1:57 PM 02/28/2019  ?  8:20 AM  ?Hepatic Function  ?Total Protein 6.0 - 8.3 g/dL 7.4   7.6   7.2    ?Albumin 3.5 - 5.2 g/dL 4.1   4.4   4.2    ?AST 0 - 37 U/L _0 ?ALT 0 - 53 U/L _1 ?Alk Phosphatase 39 - 117 U/L 112   65   73    ?Total Bilirubin 0.2 - 1.2 mg/dL 0.7   0.6   0.7    ? ? ?Lab Results  ?Component Value  Date/Time  ? TSH 3.48 03/06/2020 01:57 PM  ? TSH 3.71 08/29/2019 08:27 AM  ? FREET4 0.72 03/06/2020 01:57 PM  ? FREET4 0.74 02/09/2018 08:16 AM  ? ? ? ?  Latest Ref Rng & Units 08/01/2020  ?  8:35 PM 08/29/2019  ?  8:27 AM 08/25/2018  ?  9:06 AM  ?CBC  ?WBC 4.0 - 10.5 K/uL 9.0   8.9   7.4    ?Hemoglobin 13.0 - 17.0 g/dL 14.6   13.8   14.4    ?Hematocrit 39.0 - 52.0 % 39.8   39.9   41.5    ?Platelets 150 - 400 K/uL 233   259.0   266.0    ? ? ?Lab Results  ?Component Value Date/Time  ? VD25OH 49 05/06/2013 09:22 AM  ? ? ?Clinical ASCVD: No  ?The ASCVD Risk score (Arnett DK, et al., 2019) failed to calculate for the following reasons: ?  The 2019 ASCVD risk score is only valid for ages 15 to 49   ? ? ?Social History  ? ?Tobacco Use  ?Smoking Status Never  ?Smokeless Tobacco Never   ? ?BP Readings from Last 3 Encounters:  ?04/23/21 126/70  ?01/04/21 122/68  ?09/03/20 122/82  ? ?Pulse Readings from Last 3 Encounters:  ?04/23/21 77  ?01/04/21 65  ?09/03/20 69  ? ?Wt Readings from Last 3 Encounters:  ?04/23/21 151 lb 4 oz (68.6 kg)  ?01/04/21 152 lb 2 oz (69 kg)  ?09/03/20 150 lb 4 oz (68.2 kg)  ? ? ?Assessment: Review of patient past medical history, allergies, medications, health status, including review of consultants reports, laboratory and other test data, was performed as part of comprehensive evaluation and provision of chronic care management services.  ? ?SDOH:  (Social Determinants of Health) assessments and interventions performed:  ? ? ? ?CCM Care Plan ? ?Allergies  ?Allergen Reactions  ? Cephalexin Hives  ? ? ?Medications Reviewed Today   ? ? Reviewed by Cherre Robins, RPH-CPP (Pharmacist) on 05/02/21 at 78  Med List Status: <None>  ? ?Medication Order Taking? Sig Documenting Provider Last Dose Status Informant  ?amLODipine (NORVASC) 5 MG tablet [id number] Yes TAKE 1 TABLET BY MOUTH  DAILY Colon Branch, MD Taking Active   ?atorvastatin (LIPITOR) 10 MG tablet [id number] Yes TAKE 1 TABLET BY MOUTH  DAILY Colon Branch, MD Taking Active   ?colchicine 0.6 MG tablet [id number] No Take 1 tablet by mouth 2 (two) times daily as needed.  ?Patient not taking: Reported on 05/02/2021  ? Colon Branch, MD Not Taking Active   ?ENULOSE 10 GM/15ML SOLN [id number] Yes TAKE 45 MLS BY MOUTH DAILY  AS NEEDED Colon Branch, MD Taking Active   ?         ?Med Note Jonathon Jordan Nov 01, 2020  1:07 PM) Taking every other day  ?hydrochlorothiazide (HYDRODIURIL) 12.5 MG tablet [id number] Yes TAKE 1 TABLET BY MOUTH  DAILY Colon Branch, MD Taking Active   ?metoprolol succinate (TOPROL-XL) 25 MG 24 hr tablet [id number] Yes TAKE 1 TABLET BY MOUTH  DAILY Colon Branch, MD Taking Active   ?Multiple Vitamin (MULTIVITAMIN) tablet [id number] Yes Take 1 tablet by mouth M,W,F [provider] Taking Active   ?omeprazole  (PRILOSEC) 20 MG capsule [id number] Yes TAKE 1 CAPSULE BY MOUTH  DAILY Colon Branch, MD Taking Active   ?polyethylene glycol (MIRALAX / GLYCOLAX) 17 g packet [id number] Yes Take 17 g by mouth every other day.  [provider] Taking Active   ?predniSONE (DELTASONE) 10 MG tablet [id number] Yes Take 3 tablets by mouth daily for 3 days, then 2 tablets daily for 3 days, then 1 tablet daily for 3 days. Colon Branch, MD Taking Active   ?psyllium (REGULOID) 0.52 g capsule [id number] Yes Take 0.52 g by mouth daily. [provider] Taking Active   ?         ?Med Note (CANTER, Cheri Rous   Fri Jan 04, 2021  8:14 AM) Sometimes  ?vitamin B-12 (CYANOCOBALAMIN) 500 MCG tablet [id number] Yes Take 500 mcg by mouth. On Tuesdays, Thursdays, Saturdays and Sundays [provider] Taking Active   ? ?  ?  ? ?  ? ? ?Patient Active Problem List  ? Diagnosis Date Noted  ? GERD (gastroesophageal reflux disease) 02/28/2019  ? Melanoma of skin ([place]) 04/14/2018  ? Atypical chest pain 04/01/2015  ? PCP NOTES >>>>>>>>>>>>>>>>>>>> 11/09/2014  ? DJD (degenerative joint disease) 05/06/2013  ? Dysphagia 04/23/2012  ? Annual physical exam 04/16/2011  ? Chronic constipation 07/22/2010  ? Hyperlipidemia 07/18/2009  ? ADENOCARCINOMA, PROSTATE 07/01/2006  ? PROSTATECTOMY, HX OF 07/01/2006  ? Essential hypertension 05/19/2006  ? ? ?Immunization History  ?Administered Date(s) Administered  ? Influenza Split 10/30/2010, 10/29/2011  ? Influenza Whole 11/11/2006, 11/17/2007, 10/25/2008, 10/30/2009  ? Influenza, High Dose Seasonal PF 10/18/2015  ? Influenza,inj,Quad PF,6+ Mos 10/20/2012, 10/19/2013, 10/11/2014, 09/08/2018  ? Influenza-Unspecified 09/17/2016, 10/27/2017, 09/27/2019, 10/04/2020  ? PFIZER Comirnaty(Gray Top)Covid-19 Tri-Sucrose Vaccine 05/03/2020  ? PFIZER(Purple Top)SARS-COV-2 Vaccination 02/09/2019, 03/01/2019, 10/24/2019  ? Pension scheme manager 74yr & up 10/08/2020  ? Pneumococcal Conjugate-13 09/07/2013  ?  Pneumococcal Polysaccharide-23 05/05/2001, 10/15/2007, 08/06/2017  ? Td 03/29/2001  ? Tdap 04/16/2011  ? Zoster Recombinat (Shingrix) 09/17/2016, 01/26/2017  ? Zoster, Live 03/10/2007  ? ? ?Conditions to be

## 2021-05-02 NOTE — Patient Instructions (Addendum)
Michael Johns ?It was a pleasure speaking with you  ?Below is a summary of your health goals and care plan. I also included information about gout and diet.  ? ?Patient Goals/Self-Care Activities ?take medications as prescribed  ?check blood pressure 2 to 3 times per week, document, and provide at future appointments ?avoid NSAIDS (ibuprofen, naproxen) ; can use Tylenol / acetaminophen is needed for pain reliever, fever reducer or for headaches.  ?Decrease omeprazole '20mg'$  - take 1 capsule every OTHER day. ?Limit foods that can increase gout ? ?If you have any questions or concerns, please feel free to contact me either at the phone number below or with a MyChart message.  ? ?Keep up the good work! ? ?Cherre Robins, PharmD ?Clinical Pharmacist ?[facility] ?[place] ?[phone] (direct line)  ?[phone] (main office number) ? ?Hypertension ?BP Readings from Last 3 Encounters:  ?04/23/21 126/70  ?01/04/21 122/68  ?09/03/20 122/82  ? ?Pharmacist Clinical Goal(s): ?Over the next 90 days, patient will work with PharmD and providers to maintain BP goal <140/90 ?Current regimen:  ?Amlodipine '5mg'$  daily ?Hydrochlorothiazide 12.'5mg'$  daily  ?Metoprolol succinate '25mg'$  daily  ?Interventions: ?Discussed blood pressure  goals and recent home BP readings ?Patient self care activities - Over the next 90 days, patient will: ?Check blood pressure 2-3 times per week, document, and provide at future appointments ?Ensure daily salt intake < 2300 mg/day ? ?Hyperlipidemia ?Lab Results  ?Component Value Date/Time  ? [place] 66 01/04/2021 08:36 AM  ? LDLDIRECT 82.0 03/06/2020 01:57 PM  ? ?Pharmacist Clinical Goal(s): ?Over the next 90 days, patient will work with PharmD and providers to maintain LDL goal < 100 ?Current regimen:  ?Atorvastatin '10mg'$  daily  ?Interventions: ?none ?Patient self care activities - Over the next 90 days, patient will: ?Maintain cholesterol medication regimen.  ? ?GERD ?Pharmacist Clinical  Goal(s) ?Over the next 90 days, patient will work with PharmD and providers to reduce symptoms of GERD and polypharmacy ?Current regimen:  ?Omeprazole '20mg'$  every  day ?Interventions: ?none ?Patient self care activities - Over the next 90 days, patient will: ?Decrease omeprazole '20mg'$   to take every OTHER day ? ?Chronic Constipation ?Pharmacist Clinical Goal(s) ?Over the next 90 days, patient will work with PharmD and providers to reduce symptoms of constipation ?Current regimen:  ?Miralax 17 gram every other day ?Lactulose 64m every other day ?Metamucil tablets 1 tablet daily ?Interventions: ?Continue current therapy for bowel health ? ?Gout:  ?Improving; Goal: prevention of acute gout episodes ?Recently had acute gout episode with ankle pain on 04/23/2021. ?Prescribed prednisone for 9 days (has 1 day remaining) and  colchicine 0.'6mg'$  twice a day as needed for gout 04/23/2021 ?Interventions:  ?Discussed effect of diet on gout. Information provided.  ?Limit alcohol shellfish, organ meats and sugar intake.  ? ?Medication management ?Pharmacist Clinical Goal(s): ?Over the next 90 days, patient will work with PharmD and providers to maintain optimal medication adherence ?Current pharmacy: [facility] ?Interventions ?Comprehensive medication review performed. ?Continue current medication management strategy ?Patient self care activities - Over the next 90 days, patient will: ?Focus on medication adherence by filling and taking medications appropriately  ?Take medications as prescribed ?Report any questions or concerns to PharmD and/or provider(s) ? ? ? ?Patient verbalizes understanding of instructions and care plan provided today and agrees to view in [place] status confirmed with patient.    ? ?Gout diet: What's allowed, what's not ?Starting a gout diet? Understand which foods are OK and which  to avoid. ?By [facility] Staff ?Gout is a painful form of arthritis that occurs when high levels  of uric acid in the blood cause crystals to form and accumulate in and around a joint. ?Uric acid is produced when the body breaks down a chemical called purine. Purine occurs naturally in your body, but it's also found in certain foods. Uric acid is eliminated from the body in urine. ?A gout diet may help decrease uric acid levels in the blood. A gout diet isn't a cure. But it may lower the risk of recurring gout attacks and slow the progression of joint damage. ?People with gout who follow a gout diet generally still need medication to manage pain and to lower levels of uric acid. ?Gout diet goals ?A gout diet is designed to help you: ?Achieve a healthy weight and good eating habits ?Avoid some, but not all, foods with purines ?Include some foods that can control uric acid levels ?A good rule of thumb is to eat moderate portions of healthy foods. ?Diet details ?The general principles of a gout diet follow typical healthy-diet recommendations: ?Weight loss. Being overweight increases the risk of developing gout, and losing weight lowers the risk of gout. Research suggests that reducing the number of calories and losing weight -- even without a purine-restricted diet -- lower uric acid levels and reduce the number of gout attacks. Losing weight also lessens the overall stress on joints. ?Complex carbs. Eat more fruits, vegetables and whole grains, which provide complex carbohydrates. Avoid foods and beverages with high-fructose corn syrup, and limit consumption of naturally sweet fruit juices. ?Water. Stay well-hydrated by drinking water. ?Fats. Cut back on saturated fats from red meat, fatty poultry and high-fat dairy products. ?Proteins. Focus on lean meat and poultry, low-fat dairy and lentils as sources of protein. ? ? ?Foods that might increase uric acid: ?Organ and glandular meats. Avoid meats such as liver, kidney and sweetbreads, which have high purine levels and contribute to high blood levels of uric  acid. ?Red meat. Limit serving sizes of beef, lamb and pork. ?Seafood. Some types of seafood -- such as anchovies, shellfish (shrimp, oysters crab), sardines and tuna -- are higher in purines than are other types. But the overall health benefits of eating fish may outweigh the risks for people with gout. Moderate portions of fish can be part of a gout diet. ?High-purine vegetables. Studies have shown that vegetables high in purines, such as asparagus and spinach, don't increase the risk of gout or recurring gout attacks. ?Alcohol. Beer and distilled liquors are associated with an increased risk of gout and recurring attacks. Moderate consumption of wine doesn't appear to increase the risk of gout attacks. Avoid alcohol during gout attacks, and limit alcohol, especially beer, between attacks. ?Sugary foods and beverages. Limit or avoid sugar-sweetened foods such as sweetened cereals, bakery goods and candies. Limit consumption of naturally sweet fruit juices. ?Foods that might lower gout risk: ?Vitamin C. Vitamin C may help lower uric acid levels. Talk to your doctor about whether a 500-milligram vitamin C supplement fits into your diet and medication plan. ?Coffee. Some research suggests that drinking coffee in moderation, especially regular caffeinated coffee, may be associated with a reduced risk of gout. Drinking coffee may not be appropriate if you have other medical conditions. Talk to your doctor about how much coffee is right for you. ?Cherries. There is some evidence that eating cherries is associated with a reduced risk of gout attacks. ? ? ?

## 2021-05-26 DIAGNOSIS — I10 Essential (primary) hypertension: Secondary | ICD-10-CM

## 2021-05-26 DIAGNOSIS — E782 Mixed hyperlipidemia: Secondary | ICD-10-CM | POA: Diagnosis not present

## 2021-06-30 ENCOUNTER — Encounter: Payer: Self-pay | Admitting: Internal Medicine

## 2021-07-11 ENCOUNTER — Ambulatory Visit: Payer: Medicare Other | Admitting: Internal Medicine

## 2021-07-18 ENCOUNTER — Ambulatory Visit: Payer: Medicare Other | Admitting: Internal Medicine

## 2021-07-19 NOTE — Progress Notes (Deleted)
Subjective:   Michael Johns is a 88 y.o. male who presents for an Initial Medicare Annual Wellness Visit.  Review of Systems           Objective:    There were no vitals filed for this visit. There is no height or weight on file to calculate BMI.     02/12/2015    3:21 PM  Advanced Directives  Does Patient Have a Medical Advance Directive? Yes    Current Medications (verified) Outpatient Encounter Medications as of 07/22/2021  Medication Sig   amLODipine (NORVASC) 5 MG tablet TAKE 1 TABLET BY MOUTH  DAILY   atorvastatin (LIPITOR) 10 MG tablet TAKE 1 TABLET BY MOUTH  DAILY   colchicine 0.6 MG tablet Take 1 tablet by mouth 2 (two) times daily as needed. (Patient not taking: Reported on 05/02/2021)   ENULOSE 10 GM/15ML SOLN TAKE 45 MLS BY MOUTH DAILY  AS NEEDED   hydrochlorothiazide (HYDRODIURIL) 12.5 MG tablet TAKE 1 TABLET BY MOUTH  DAILY   metoprolol succinate (TOPROL-XL) 25 MG 24 hr tablet TAKE 1 TABLET BY MOUTH  DAILY   Multiple Vitamin (MULTIVITAMIN) tablet Take 1 tablet by mouth M,W,F   omeprazole (PRILOSEC) 20 MG capsule Take 1 capsule (20 mg total) by mouth every other day.   polyethylene glycol (MIRALAX / GLYCOLAX) 17 g packet Take 17 g by mouth every other day.   predniSONE (DELTASONE) 10 MG tablet Take 3 tablets by mouth daily for 3 days, then 2 tablets daily for 3 days, then 1 tablet daily for 3 days.   psyllium (REGULOID) 0.52 g capsule Take 0.52 g by mouth daily.   vitamin B-12 (CYANOCOBALAMIN) 500 MCG tablet Take 500 mcg by mouth. On Tuesdays, Thursdays, Saturdays and Sundays   No facility-administered encounter medications on file as of 07/22/2021.    Allergies (verified) Cephalexin   History: Past Medical History:  Diagnosis Date   Abdominal pain 11/09   due to constipation and ?pancreatitis- admitted   ADENOCARCINOMA, PROSTATE 07/01/2006   Atypical chest pain 04/01/2015   Constipation    chronic, impaction ~2012 (admited) and 01-2015   GERD  (gastroesophageal reflux disease)    Gout    see 09-2008 note   Hearing decreased    right, 40% x years   HYPERLIPIDEMIA, MILD 07/18/2009   HYPERTENSION 05/19/2006   Melanoma (HCC)    Past Surgical History:  Procedure Laterality Date   APPENDECTOMY     cataracts Bilateral 2014   lens implantinfo scanned (02-09-17)   INGUINAL HERNIA REPAIR Right 11/2007   MELANOMA EXCISION Right 05/12/2018   R leg melanoma. Dr  Jeannine B[facility] at [facility] in [place].   POLYPECTOMY     PROSTATECTOMY  05/2007   LADS (-) Bone scan (-)   Rt middle ear surgery     sinus polyp removed     TONSILLECTOMY     Family History  Problem Relation Age of Onset   Hypertension Mother    Diabetes Mother    Heart disease Mother        CABG 54 y/o   Esophageal cancer Father    Heart disease Maternal Grandmother        MI   Leukemia Maternal Grandfather    Cancer Other        all paternal uncles-types unknown   Prostate cancer Neg Hx    Colon cancer Neg Hx    Rectal cancer Neg Hx    Stomach cancer Neg  Hx    Social History   Socioeconomic History   Marital status: Married    Spouse name: Not on file   Number of children: 0   Years of education: Not on file   Highest education level: Not on file  Occupational History   Occupation: retired     Associate Professor: RETIRED  Tobacco Use   Smoking status: Never   Smokeless tobacco: Never  Substance and Sexual Activity   Alcohol use: Yes    Comment: socially    Drug use: No   Sexual activity: Not on file  Other Topics Concern   Not on file  Social History Narrative   Re-married, no children, [name] , present wife has 3 daughters      retired-- worked for the [employer]: Low Risk  (11/01/2020)   Overall Financial Resource Strain (CARDIA)    Difficulty of Paying Living Expenses: Not hard at all  Food Insecurity: No Food Insecurity (05/02/2020)   Hunger Vital Sign    Worried  About Running Out of Food in the Last Year: Never true    Ran Out of Food in the Last Year: Never true  Transportation Needs: No Transportation Needs (11/01/2020)   PRAPARE - Administrator, Civil Service (Medical): No    Lack of Transportation (Non-Medical): No  Physical Activity: Sufficiently Active (05/02/2020)   Exercise Vital Sign    Days of Exercise per Week: 3 days    Minutes of Exercise per Session: 50 min  Stress: Not on file  Social Connections: Not on file    Tobacco Counseling Counseling given: Not Answered   Clinical Intake:                 Diabetic?No         Activities of Daily Living     No data to display          Patient Care Team: Wanda Plump, MD as PCP - General Louis Meckel, MD (Inactive) as Consulting Physician (Gastroenterology) Tyrone Schimke, MD as Consulting Physician (Ophthalmology) Serena Colonel, MD as Consulting Physician (Otolaryngology)  Indicate any recent Medical Services you may have received from other than [facility] providers in the past year (date may be approximate).     Assessment:   This is a routine wellness examination for [name].  Hearing/Vision screen No results found.  Dietary issues and exercise activities discussed:     Goals Addressed   None    Depression Screen    01/04/2021    8:16 AM 08/22/2020    8:00 AM 03/06/2020    1:19 PM 02/28/2019    7:59 AM 02/09/2018    8:21 AM 02/04/2017    8:59 AM 12/19/2015    1:45 PM  PHQ 2/9 Scores  PHQ - 2 Score 0 0 0 0 0 0 0    Fall Risk    01/04/2021    8:16 AM 08/22/2020    8:00 AM 03/06/2020    1:20 PM 02/28/2019    7:59 AM 02/09/2018    8:03 AM  Fall Risk   Falls in the past year? 0 0 0 0 0  Number falls in past yr: 0 0 0 0   Injury with Fall? 0 0 0 0   Follow up Falls evaluation completed Falls evaluation completed Falls evaluation completed Falls evaluation completed     FALL RISK PREVENTION PERTAINING TO THE HOME:  Any stairs  in or around  the home? {YES/NO:21197} If so, are there any without handrails? {YES/NO:21197} Home free of loose throw rugs in walkways, pet beds, electrical cords, etc? {YES/NO:21197} Adequate lighting in your home to reduce risk of falls? {YES/NO:21197}  ASSISTIVE DEVICES UTILIZED TO PREVENT FALLS:  Life alert? {YES/NO:21197} Use of a cane, walker or w/c? {YES/NO:21197} Grab bars in the bathroom? {YES/NO:21197} Shower chair or bench in shower? {YES/NO:21197} Elevated toilet seat or a handicapped toilet? {YES/NO:21197}  TIMED UP AND GO:  Was the test performed? {YES/NO:21197}.  Length of time to ambulate 10 feet: *** sec.   {Appearance of ZOXW:9604540}  Cognitive Function:        Immunizations Immunization History  Administered Date(s) Administered   Influenza Split 10/30/2010, 10/29/2011   Influenza Whole 11/11/2006, 11/17/2007, 10/25/2008, 10/30/2009   Influenza, High Dose Seasonal PF 10/18/2015   Influenza,inj,Quad PF,6+ Mos 10/20/2012, 10/19/2013, 10/11/2014, 09/08/2018   Influenza-Unspecified 09/17/2016, 10/27/2017, 09/27/2019, 10/04/2020   PFIZER Comirnaty(Gray Top)Covid-19 Tri-Sucrose Vaccine 05/03/2020   PFIZER(Purple Top)SARS-COV-2 Vaccination 02/09/2019, 03/01/2019, 10/24/2019   Pfizer Covid-19 Vaccine Bivalent Booster 67yrs & up 10/08/2020   Pneumococcal Conjugate-13 09/07/2013   Pneumococcal Polysaccharide-23 05/05/2001, 10/15/2007, 08/06/2017   Td 03/29/2001   Tdap 04/16/2011   Zoster Recombinat (Shingrix) 09/17/2016, 01/26/2017   Zoster, Live 03/10/2007    TDAP status: Up to date  Flu Vaccine status: Up to date  Pneumococcal vaccine status: Up to date  Covid-19 vaccine status: Completed vaccines  Qualifies for Shingles Vaccine? Yes   Zostavax completed No   Shingrix Completed?: Yes  Screening Tests Health Maintenance  Topic Date Due   TETANUS/TDAP  04/15/2021   INFLUENZA VACCINE  08/27/2021   Pneumonia Vaccine 28+ Years old  Completed   COVID-19 Vaccine   Completed   Zoster Vaccines- Shingrix  Completed   HPV VACCINES  Aged Out    Health Maintenance  Health Maintenance Due  Topic Date Due   TETANUS/TDAP  04/15/2021    Colorectal cancer screening: No longer required.   Lung Cancer Screening: (Low Dose CT Chest recommended if Age 64-80 years, 30 pack-year currently smoking OR have quit w/in 15years.) does not qualify.   Lung Cancer Screening Referral: N/A  Additional Screening:  Hepatitis C Screening: does not qualify; Completed aged out  Vision Screening: Recommended annual ophthalmology exams for early detection of glaucoma and other disorders of the eye. Is the patient up to date with their annual eye exam?  {YES/NO:21197} Who is the provider or what is the name of the office in which the patient attends annual eye exams? *** If pt is not established with a provider, would they like to be referred to a provider to establish care? {YES/NO:21197}.   Dental Screening: Recommended annual dental exams for proper oral hygiene  Community Resource Referral / Chronic Care Management: CRR required this visit?  {YES/NO:21197}  CCM required this visit?  {YES/NO:21197}     Plan:     I have personally reviewed and noted the following in the patient's chart:   Medical and social history Use of alcohol, tobacco or illicit drugs  Current medications and supplements including opioid prescriptions. {Opioid [phone]} Functional ability and status Nutritional status Physical activity Advanced directives List of other physicians Hospitalizations, surgeries, and ER visits in previous 12 months Vitals Screenings to include cognitive, depression, and falls Referrals and appointments  In addition, I have reviewed and discussed with patient certain preventive protocols, quality metrics, and best practice recommendations. A written personalized care plan for preventive services as well  as general preventive health  recommendations were provided to patient.     Salomon Mast Izaiyah Kleinman, CMA   07/19/2021   Nurse Notes: ***

## 2021-07-22 ENCOUNTER — Ambulatory Visit (INDEPENDENT_AMBULATORY_CARE_PROVIDER_SITE_OTHER): Payer: Medicare Other | Admitting: Internal Medicine

## 2021-07-22 ENCOUNTER — Encounter: Payer: Self-pay | Admitting: Internal Medicine

## 2021-07-22 ENCOUNTER — Ambulatory Visit: Payer: Medicare Other

## 2021-07-22 VITALS — BP 126/68 | HR 60 | Temp 97.8°F | Resp 16 | Ht 64.0 in | Wt 154.4 lb

## 2021-07-22 DIAGNOSIS — I10 Essential (primary) hypertension: Secondary | ICD-10-CM | POA: Diagnosis not present

## 2021-07-22 DIAGNOSIS — E782 Mixed hyperlipidemia: Secondary | ICD-10-CM | POA: Diagnosis not present

## 2021-07-22 DIAGNOSIS — Z8546 Personal history of malignant neoplasm of prostate: Secondary | ICD-10-CM

## 2021-07-22 DIAGNOSIS — Z23 Encounter for immunization: Secondary | ICD-10-CM

## 2021-07-22 LAB — CBC WITH DIFFERENTIAL/PLATELET
Basophils Absolute: 0.1 10*3/uL (ref 0.0–0.1)
Basophils Relative: 0.8 % (ref 0.0–3.0)
Eosinophils Absolute: 0.4 10*3/uL (ref 0.0–0.7)
Eosinophils Relative: 4 % (ref 0.0–5.0)
HCT: 38.5 % — ABNORMAL LOW (ref 39.0–52.0)
Hemoglobin: 13.5 g/dL (ref 13.0–17.0)
Lymphocytes Relative: 34.3 % (ref 12.0–46.0)
Lymphs Abs: 3.1 10*3/uL (ref 0.7–4.0)
MCHC: 35 g/dL (ref 30.0–36.0)
MCV: 88.9 fl (ref 78.0–100.0)
Monocytes Absolute: 1.1 10*3/uL — ABNORMAL HIGH (ref 0.1–1.0)
Monocytes Relative: 12.2 % — ABNORMAL HIGH (ref 3.0–12.0)
Neutro Abs: 4.4 10*3/uL (ref 1.4–7.7)
Neutrophils Relative %: 48.7 % (ref 43.0–77.0)
Platelets: 227 10*3/uL (ref 150.0–400.0)
RBC: 4.34 Mil/uL (ref 4.22–5.81)
RDW: 15.2 % (ref 11.5–15.5)
WBC: 9 10*3/uL (ref 4.0–10.5)

## 2021-07-22 LAB — LIPID PANEL
Cholesterol: 150 mg/dL (ref 0–200)
HDL: 46.5 mg/dL (ref >39.00)
LDL Cholesterol: 80 mg/dL (ref 0–99)
NonHDL: 103.85
Total CHOL/HDL Ratio: 3
Triglycerides: 121 mg/dL (ref 0.0–149.0)
VLDL: 24.2 mg/dL (ref 0.0–40.0)

## 2021-07-22 LAB — PSA: PSA: 0 ng/mL — ABNORMAL LOW (ref 0.10–4.00)

## 2021-07-22 MED ORDER — TETANUS-DIPHTH-ACELL PERTUSSIS 5-2.5-18.5 LF-MCG/0.5 IM SUSP: 0.5000 mL | Freq: Once | INTRAMUSCULAR | 0 refills | Status: AC

## 2021-07-22 NOTE — Progress Notes (Signed)
Subjective:    Patient ID: Michael Johns, male    DOB: Mar 12, 1932, 88 y.o.   MRN: [id number]  DOS:  07/22/2021 Type of visit - description: Follow-up  Since the last office visit he is doing well. Was seen with a episode of gout, now asymptomatic. Denies dysuria, gross hematuria or difficulty urinating. Occasionally urinary incont since he had the prostate removed. Very rarely has dysphagia.  Symptoms are mild  Review of Systems See above   Past Medical History:  Diagnosis Date   Abdominal pain 11/09   due to constipation and ?pancreatitis- admitted   ADENOCARCINOMA, PROSTATE 07/01/2006   Atypical chest pain 04/01/2015   Constipation    chronic, impaction ~2012 (admited) and 01-2015   GERD (gastroesophageal reflux disease)    Gout    see 09-2008 note   Hearing decreased    right, 40% x years   HYPERLIPIDEMIA, MILD 07/18/2009   HYPERTENSION 05/19/2006   Melanoma ([place])     Past Surgical History:  Procedure Laterality Date   APPENDECTOMY     cataracts Bilateral 2014   lens implantinfo scanned (02-09-17)   INGUINAL HERNIA REPAIR Right 11/2007   MELANOMA EXCISION Right 05/12/2018   R leg melanoma. Dr  Winifred Olive at the [facility] in [place].   POLYPECTOMY     PROSTATECTOMY  05/2007   LADS (-) Bone scan (-)   Rt middle ear surgery     sinus polyp removed     TONSILLECTOMY      Current Outpatient Medications  Medication Instructions   amLODipine (NORVASC) 5 MG tablet TAKE 1 TABLET BY MOUTH  DAILY   atorvastatin (LIPITOR) 10 MG tablet TAKE 1 TABLET BY MOUTH  DAILY   ENULOSE 10 GM/15ML SOLN TAKE 45 MLS BY MOUTH DAILY  AS NEEDED   hydrochlorothiazide (HYDRODIURIL) 12.5 MG tablet TAKE 1 TABLET BY MOUTH  DAILY   metoprolol succinate (TOPROL-XL) 25 MG 24 hr tablet TAKE 1 TABLET BY MOUTH  DAILY   Multiple Vitamin (MULTIVITAMIN) tablet Take 1 tablet by mouth M,W,F   omeprazole (PRILOSEC) 20 mg, Oral, Every other day   polyethylene glycol (MIRALAX / GLYCOLAX)  17 g, Oral, Every other day   psyllium (REGULOID) 0.52 g, Oral, Daily   vitamin B-12 (CYANOCOBALAMIN) 500 mcg, Oral, On Tuesdays, Thursdays, Saturdays and Sundays       Objective:   Physical Exam BP 126/68   Pulse 60   Temp 97.8 F (36.6 C) (Oral)   Resp 16   Ht '5\' 4"'$  (1.626 m)   Wt 154 lb 6 oz (70 kg)   SpO2 97%   BMI 26.50 kg/m  General:   Well developed, NAD, BMI noted. HEENT:  Normocephalic . Face symmetric, atraumatic Lungs:  CTA B Normal respiratory effort, no intercostal retractions, no accessory muscle use. Heart: RRR,  no murmur.  Lower extremities: no pretibial edema bilaterally  Skin: Not pale. Not jaundice Neurologic:  alert & oriented X3.  Speech normal, gait appropriate for age and unassisted Psych--  Cognition and judgment appear intact.  Cooperative with normal attention span and concentration.  Behavior appropriate. No anxious or depressed appearing.      Assessment    Assessment HTN Hyperlipidemia Leg cramps-- lipitor d/c in the past, cramps decreased, back on lipitor as cramps are  mild and benefits > risks. CoQ10 helps, CKs normal. rx robaxin 03-2015 Prostate cancer-- prostatectomy 05-2007, urology stopped f/u,  PSA checked by PCP SKIN: ---[facility] ---Melanoma, R leg , excision~ July  2020 [facility]: GERD,  --Dysphagia-- Rx  EGD 02-2014 (declined,sx chronic, mild) --H. pylori positive, re-checked breathing test negative 06-2015 --Chronic mild constipation, fecal impaction 2012 (admited per pt) and 01-2015 (ER): on MiraLAX qd, senokot or lactulose prn HOH   CP- saw cards, (-) stress test (EET) 10-2015 Covid + ~ 10-2020, no Rx   PLAN: HTN: Ambulatory BPs are great in the 120/60 range. Continue amlodipine, HCTZ, metoprolol.  Last BMP okay. Hyperlipidemia: On Lipitor, check FLP. History of prostate cancer: Check PSA. Immunizations: PNM 20 today. Also recommend a Tdap and COVID booster. RTC 4 to 6 months CPX

## 2021-07-27 ENCOUNTER — Other Ambulatory Visit: Payer: Self-pay | Admitting: Internal Medicine

## 2021-08-13 ENCOUNTER — Other Ambulatory Visit (HOSPITAL_BASED_OUTPATIENT_CLINIC_OR_DEPARTMENT_OTHER): Payer: Self-pay

## 2021-08-13 MED ORDER — BOOSTRIX 5-2.5-18.5 LF-MCG/0.5 IM SUSY
PREFILLED_SYRINGE | INTRAMUSCULAR | 0 refills | Status: DC
  Filled 2021-08-13: qty 0.5, 1d supply, fill #0

## 2021-09-21 ENCOUNTER — Other Ambulatory Visit: Payer: Self-pay | Admitting: Internal Medicine

## 2021-10-01 ENCOUNTER — Other Ambulatory Visit: Payer: Self-pay

## 2021-10-02 ENCOUNTER — Other Ambulatory Visit (HOSPITAL_BASED_OUTPATIENT_CLINIC_OR_DEPARTMENT_OTHER): Payer: Self-pay

## 2021-10-02 ENCOUNTER — Ambulatory Visit (INDEPENDENT_AMBULATORY_CARE_PROVIDER_SITE_OTHER): Payer: Medicare Other | Admitting: Internal Medicine

## 2021-10-02 ENCOUNTER — Encounter: Payer: Self-pay | Admitting: Internal Medicine

## 2021-10-02 VITALS — BP 122/80 | HR 51 | Temp 98.0°F | Resp 18 | Ht 64.0 in | Wt 152.2 lb

## 2021-10-02 DIAGNOSIS — J019 Acute sinusitis, unspecified: Secondary | ICD-10-CM | POA: Diagnosis not present

## 2021-10-02 DIAGNOSIS — R21 Rash and other nonspecific skin eruption: Secondary | ICD-10-CM | POA: Diagnosis not present

## 2021-10-02 MED ORDER — KETOCONAZOLE 2 % EX CREA
1.0000 | TOPICAL_CREAM | Freq: Every day | CUTANEOUS | 0 refills | Status: DC
  Filled 2021-10-02: qty 30, 20d supply, fill #0

## 2021-10-02 MED ORDER — HYDROCORTISONE 2.5 % EX CREA
1.0000 | TOPICAL_CREAM | Freq: Two times a day (BID) | CUTANEOUS | 0 refills | Status: DC
  Filled 2021-10-02: qty 30, 15d supply, fill #0

## 2021-10-02 NOTE — Patient Instructions (Signed)
For allergies: Flonase over-the-counter: 2 sprays in his nose daily Astepro over-the-counter: 2 sprays in his nose twice daily  For the rash, I sent 2 prescriptions. Apply the creams twice daily for 10 days.  If you are not better in the next couple of weeks please let me know

## 2021-10-02 NOTE — Progress Notes (Signed)
Subjective:    Patient ID: Michael Johns, male    DOB: [birth year], 88 y.o.   MRN: [id number]  DOS:  10/02/2021 Type of visit - description: Acute visit  Developed upper respiratory symptoms for the last 3 weeks. Sinus congestion, left or right, on and off.  Sinuses hurt to some degree and feels stopped up. Has some white nasal discharge, no bloody discharge. No fever chills + Sneezing + Itchy and watery eyes. No fever or chills.  Also, 3 weeks history of a rash on the suprapubic area.  No itching.  Review of Systems See above   Past Medical History:  Diagnosis Date   Abdominal pain 11/09   due to constipation and ?pancreatitis- admitted   ADENOCARCINOMA, PROSTATE 07/01/2006   Atypical chest pain 04/01/2015   Constipation    chronic, impaction ~2012 (admited) and 01-2015   GERD (gastroesophageal reflux disease)    Gout    see 09-2008 note   Hearing decreased    right, 40% x years   HYPERLIPIDEMIA, MILD 07/18/2009   HYPERTENSION 05/19/2006   Melanoma ([place])     Past Surgical History:  Procedure Laterality Date   APPENDECTOMY     cataracts Bilateral 2014   lens implantinfo scanned (02-09-17)   INGUINAL HERNIA REPAIR Right 11/2007   MELANOMA EXCISION Right 05/12/2018   R leg melanoma. Dr  Winifred Olive at the [facility] in [place].   POLYPECTOMY     PROSTATECTOMY  05/2007   LADS (-) Bone scan (-)   Rt middle ear surgery     sinus polyp removed     TONSILLECTOMY      Current Outpatient Medications  Medication Instructions   amLODipine (NORVASC) 5 MG tablet TAKE 1 TABLET BY MOUTH DAILY   atorvastatin (LIPITOR) 10 MG tablet TAKE 1 TABLET BY MOUTH DAILY   ENULOSE 10 GM/15ML SOLN TAKE 45 MLS BY MOUTH DAILY  AS NEEDED   hydrochlorothiazide (HYDRODIURIL) 12.5 MG tablet TAKE 1 TABLET BY MOUTH DAILY   metoprolol succinate (TOPROL-XL) 25 MG 24 hr tablet TAKE 1 TABLET BY MOUTH ONCE  DAILY   Multiple Vitamin (MULTIVITAMIN) tablet Take 1 tablet by mouth M,W,F    omeprazole (PRILOSEC) 20 MG capsule TAKE 1 CAPSULE BY MOUTH  DAILY   polyethylene glycol (MIRALAX / GLYCOLAX) 17 g, Oral, Every other day   psyllium (REGULOID) 0.52 g, Oral, Daily   vitamin B-12 (CYANOCOBALAMIN) 500 mcg, Oral, On Tuesdays, Thursdays, Saturdays and Sundays       Objective:   Physical Exam Skin:        BP 122/80   Pulse (!) 51   Temp 98 F (36.7 C) (Oral)   Resp 18   Ht '5\' 4"'$  (1.626 m)   Wt 152 lb 4 oz (69.1 kg)   SpO2 96%   BMI 26.13 kg/m  General:   Well developed, NAD, BMI noted. HEENT:  Normocephalic . Face symmetric, atraumatic. TMs: No bulge, normal TMs except for some scar tissue B. Nose: Congested bilaterally. Sinuses non-TTP. Throat symmetric, no red Lungs:  CTA B Normal respiratory effort, no intercostal retractions, no accessory muscle use. Heart: RRR,  no murmur.  Lower extremities: no pretibial edema bilaterally  Neurologic:  alert & oriented X3.  Speech normal, gait appropriate for age and unassisted Psych--  Cognition and judgment appear intact.  Cooperative with normal attention span and concentration.  Behavior appropriate. No anxious or depressed appearing.      Assessment   Assessment HTN Hyperlipidemia  Leg cramps-- lipitor d/c in the past, cramps decreased, back on lipitor as cramps are  mild and benefits > risks. CoQ10 helps, CKs normal. rx robaxin 03-2015 Prostate cancer-- prostatectomy 05-2007, urology stopped f/u,  PSA checked by PCP SKIN: ---[facility]CC ---Melanoma, R leg , excision~ July 2020 [facility]: GERD,  --Dysphagia-- Rx  EGD 02-2014 (declined,sx chronic, mild) --H. pylori positive, re-checked breathing test negative 06-2015 --Chronic mild constipation, fecal impaction 2012 (admited per pt) and 01-2015 (ER): on MiraLAX qd, senokot or lactulose prn HOH   CP- saw cards, (-) stress test (EET) 10-2015 Covid + ~ 10-2020, no Rx   PLAN: Sinusitis: Going on for 3 weeks, no evidence of infection, likely allergic  sinusitis Plan: Flonase, Astepro, use consistently.  If not better he will let me know. Rash: see exam, probably a fungal infection, will treat with both ketoconazole and hydrocortisone.  Call if no better.

## 2021-10-03 NOTE — Assessment & Plan Note (Signed)
Sinusitis: Going on for 3 weeks, no evidence of infection, likely allergic sinusitis Plan: Flonase, Astepro, use consistently.  If not better he will let me know. Rash: see exam, probably a fungal infection, will treat with both ketoconazole and hydrocortisone.  Call if no better.

## 2021-10-11 ENCOUNTER — Ambulatory Visit: Payer: Medicare Other | Admitting: Internal Medicine

## 2021-10-18 ENCOUNTER — Encounter: Payer: Self-pay | Admitting: Internal Medicine

## 2021-10-21 ENCOUNTER — Other Ambulatory Visit (HOSPITAL_BASED_OUTPATIENT_CLINIC_OR_DEPARTMENT_OTHER): Payer: Self-pay

## 2021-10-21 MED ORDER — FLUAD QUADRIVALENT 0.5 ML IM PRSY
PREFILLED_SYRINGE | INTRAMUSCULAR | 0 refills | Status: DC
  Filled 2021-10-21: qty 0.5, 1d supply, fill #0

## 2021-11-01 ENCOUNTER — Ambulatory Visit: Payer: Medicare Other | Admitting: Pharmacist

## 2021-11-01 DIAGNOSIS — E782 Mixed hyperlipidemia: Secondary | ICD-10-CM

## 2021-11-01 DIAGNOSIS — K219 Gastro-esophageal reflux disease without esophagitis: Secondary | ICD-10-CM

## 2021-11-01 DIAGNOSIS — I10 Essential (primary) hypertension: Secondary | ICD-10-CM

## 2021-11-01 NOTE — Progress Notes (Signed)
  Pharmacy Note  11/01/2021 Name: Michael Johns MRN: [id number] DOB: 03/18/1932  Subjective: Michael Johns is a 89 y.o. year old male who is a primary care patient of Paz, Jose E, MD. Clinical Pharmacist Practitioner referral was placed to assist with medication management.    Engaged with patient by telephone for follow up visit today.  Hypertension - home blood pressure has been at goal 111/58; 127/65; 123/62. Denies dizziness. Taking metoprolol succinate 25mg daily, amlodipine 5mg daily and hydrochlorothiazide 12.5mg daily  Hyperlipidemia - Last LDL was 80. Taking atorvastatin 80mg daily. Denies myalgias.  Not exercising regularly but golfs about 2 times per week. Use to go to [employer] but stopped due to COVID and never restarted.  GERD - no recent reflux symptoms. At last visit we discussed trial of omeprazole 20mg every other day but patient still taking daily.  CKD3b Patient also mentions chest congestion that started a few days ago. Initially had sinus drainage but that has improved and just feels a little congestion in his chest. Reports just occasional cough.   SDOH (Social Determinants of Health) assessments and interventions performed:  SDOH Interventions    Flowsheet Row Chronic Care Management from 11/01/2020 in [facility] Chronic Care Management from 05/02/2020 in [facility]  SDOH Interventions    Food Insecurity Interventions -- Intervention Not Indicated  Transportation Interventions Intervention Not Indicated --  Financial Strain Interventions Intervention Not Indicated --  Physical Activity Interventions -- Intervention Not Indicated        Objective: Review of patient status, including review of consultants reports, laboratory and other test data, was performed as part of comprehensive evaluation and provision of chronic care management services.   Lab Results  Component  Value Date   CREATININE 1.29 04/23/2021   CREATININE 1.19 01/04/2021   CREATININE 1.08 08/01/2020    Lab Results  Component Value Date   HGBA1C 5.4 09/03/2020       Component Value Date/Time   CHOL 150 07/22/2021 0839   TRIG 121.0 07/22/2021 0839   HDL 46.50 07/22/2021 0839   CHOLHDL 3 07/22/2021 0839   VLDL 24.2 07/22/2021 0839   LDLCALC 80 07/22/2021 0839   LDLDIRECT 82.0 03/06/2020 1357     Clinical ASCVD: No  The ASCVD Risk score (Arnett DK, et al., 2019) failed to calculate for the following reasons:   The 2019 ASCVD risk score is only valid for ages 40 to 79    BP Readings from Last 3 Encounters:  10/02/21 122/80  07/22/21 126/68  04/23/21 126/70     Allergies  Allergen Reactions   Cephalexin Hives    Medications Reviewed Today     Reviewed by Eckard, Tammy, RPH-CPP (Pharmacist) on 11/01/21 at 1313  Med List Status: <None>   Medication Order Taking? Sig Documenting Provider Last Dose Status Informant  amLODipine (NORVASC) 5 MG tablet [id number] Yes TAKE 1 TABLET BY MOUTH DAILY Webb, Padonda B, FNP Taking Active   atorvastatin (LIPITOR) 10 MG tablet [id number] Yes TAKE 1 TABLET BY MOUTH DAILY Webb, Padonda B, FNP Taking Active   B COMPLEX-BIOTIN-FA PO [id number] Yes Take 1 tablet by mouth daily. On Tuesday, Thursday, Saturday and Sunday [provider] Taking Active   ENULOSE 10 GM/15ML SOLN [id number] Yes TAKE 45 MLS BY MOUTH DAILY  AS NEEDED Paz, Jose E, MD Taking Active            Med Note (ECKARD,   TAMMY B   Fri Nov 01, 2021  1:08 PM) Rarely needs to use this  hydrochlorothiazide (HYDRODIURIL) 12.5 MG tablet [id number] Yes TAKE 1 TABLET BY MOUTH DAILY Webb, Padonda B, FNP Taking Active   metoprolol succinate (TOPROL-XL) 25 MG 24 hr tablet [id number] Yes TAKE 1 TABLET BY MOUTH ONCE  DAILY Paz, Jose E, MD Taking Active   Multiple Vitamin (MULTIVITAMIN) tablet [id number] Yes Take 1 tablet by mouth M,W,F [provider] Taking Active   omeprazole  (PRILOSEC) 20 MG capsule [id number] Yes TAKE 1 CAPSULE BY MOUTH  DAILY Paz, Jose E, MD Taking Active   OVER THE COUNTER MEDICATION [id number] Yes Probiotic 10  - 1 tablet / capsule daily [provider] Taking Active   polyethylene glycol (MIRALAX / GLYCOLAX) 17 g packet [id number] Yes Take 17 g by mouth every other day. [provider] Taking Active             Patient Active Problem List   Diagnosis Date Noted   GERD (gastroesophageal reflux disease) 02/28/2019   Melanoma of skin (HCC) 04/14/2018   Atypical chest pain 04/01/2015   PCP NOTES >>>>>>>>>>>>>>>>>>>> 11/09/2014   DJD (degenerative joint disease) 05/06/2013   Dysphagia 04/23/2012   Annual physical exam 04/16/2011   Chronic constipation 07/22/2010   Hyperlipidemia 07/18/2009   ADENOCARCINOMA, PROSTATE 07/01/2006   PROSTATECTOMY, HX OF 07/01/2006   Essential hypertension 05/19/2006     Medication Assistance:  None required.  Patient affirms current coverage meets needs.   Assessment:   Hypertension - at goal Hyperlipidemia - at goal Medication management - filling meds on time. Using [facility].  Chest congestio Plan: Restart exercise at [employer] after receives COVID vaccine Discussed drinking water and staying hydrated.  Continue current medications for hypertension and hyperlipidemia.  Take Mucinex 1200mg twice a day for chest congestion. If not improving in 2 to 3 days, contact office to see PCP.     Follow Up:  No further follow up required: Patient has met medication related goals   Tammy Eckard, PharmD Clinical Pharmacist [facility]  [facility] [phone]   

## 2021-11-11 ENCOUNTER — Other Ambulatory Visit (HOSPITAL_BASED_OUTPATIENT_CLINIC_OR_DEPARTMENT_OTHER): Payer: Self-pay

## 2021-11-11 ENCOUNTER — Encounter: Payer: Self-pay | Admitting: Internal Medicine

## 2021-11-11 ENCOUNTER — Ambulatory Visit (INDEPENDENT_AMBULATORY_CARE_PROVIDER_SITE_OTHER): Payer: Medicare Other | Admitting: Internal Medicine

## 2021-11-11 ENCOUNTER — Telehealth: Payer: Self-pay | Admitting: Internal Medicine

## 2021-11-11 VITALS — BP 132/68 | HR 86 | Temp 97.7°F | Resp 16 | Ht 64.0 in | Wt 151.0 lb

## 2021-11-11 DIAGNOSIS — R739 Hyperglycemia, unspecified: Secondary | ICD-10-CM

## 2021-11-11 DIAGNOSIS — R051 Acute cough: Secondary | ICD-10-CM

## 2021-11-11 DIAGNOSIS — J309 Allergic rhinitis, unspecified: Secondary | ICD-10-CM

## 2021-11-11 DIAGNOSIS — E038 Other specified hypothyroidism: Secondary | ICD-10-CM

## 2021-11-11 DIAGNOSIS — I10 Essential (primary) hypertension: Secondary | ICD-10-CM

## 2021-11-11 DIAGNOSIS — E782 Mixed hyperlipidemia: Secondary | ICD-10-CM

## 2021-11-11 MED ORDER — COMIRNATY 30 MCG/0.3ML IM SUSY
PREFILLED_SYRINGE | INTRAMUSCULAR | 0 refills | Status: DC
  Filled 2021-11-11: qty 0.3, 1d supply, fill #0

## 2021-11-11 MED ORDER — FLUTICASONE PROPIONATE 50 MCG/ACT NA SUSP
2.0000 | Freq: Every day | NASAL | 6 refills | Status: DC
  Filled 2021-11-11: qty 16, 30d supply, fill #0

## 2021-11-11 MED ORDER — AZELASTINE HCL 0.1 % NA SOLN
2.0000 | Freq: Two times a day (BID) | NASAL | 6 refills | Status: DC
  Filled 2021-11-11: qty 30, 25d supply, fill #0

## 2021-11-11 NOTE — Telephone Encounter (Signed)
Please advise 

## 2021-11-11 NOTE — Telephone Encounter (Signed)
CMP, A1c, TSH and free T4 (increased TSH)

## 2021-11-11 NOTE — Assessment & Plan Note (Signed)
Allergic sinusitis: Symptoms are on and off, typically well controlled if he uses both Flonase and Astepro. Plan: Continue nasal sprays, for times of high symptoms use both sprays, for times of mild symptoms use Flonase only. Cough: Started 2 weeks ago, overall improving, exam is benign today.  Patient is concerned about this cough be related to RSV infection, I cannot make that determination but he seems to be doing well.  Recommend to continue OTCs, call if not better in the next few days.  Zithromax?. RTC CPX next month recommended

## 2021-11-11 NOTE — Telephone Encounter (Signed)
Orders have been placed. Please schedule lab appt several days prior to appt. Thank you.

## 2021-11-11 NOTE — Patient Instructions (Addendum)
Allergies: Use Flonase 2 sprays on each side of nose daily Astepro 2 sprays on each side of the nose twice daily  You can use Flonase only if the allergies are not too severe.   For cough:  Continue over-the-counter Mucinex DM as needed. If you are not completely well by next week call for possible antibiotics      GO TO THE FRONT DESK, [place] back for for a physical exam next month

## 2021-11-11 NOTE — Telephone Encounter (Signed)
Patient wanting to do CPE labs before his PM appt next month  Can we insert for him & I will call to make an appt

## 2021-11-11 NOTE — Progress Notes (Signed)
Subjective:    Patient ID: Michael Johns, male    DOB: Oct 18, 1932, 88 y.o.   MRN: [id number]  DOS:  11/11/2021 Type of visit - description: Acute  Developed chest congestion approximately 2 weeks ago: + Cough, some yellowish sputum. No fever chills No chest pain or difficulty breathing. Has taken Mucinex DM, helped. Overall symptoms have decreased in the last couple of days. Still has nasal congestion.   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 Dr  Winifred Olive at the [facility] in [place].   POLYPECTOMY     PROSTATECTOMY  05/2007   LADS (-) Bone scan (-)   Rt middle ear surgery     sinus polyp removed     TONSILLECTOMY      Current Outpatient Medications  Medication Instructions   amLODipine (NORVASC) 5 MG tablet TAKE 1 TABLET BY MOUTH DAILY   atorvastatin (LIPITOR) 10 MG tablet TAKE 1 TABLET BY MOUTH DAILY   B COMPLEX-BIOTIN-FA PO 1 tablet, Oral, Daily, On Tuesday, Thursday, Saturday and Sunday   ENULOSE 10 GM/15ML SOLN TAKE 45 MLS BY MOUTH DAILY  AS NEEDED   hydrochlorothiazide (HYDRODIURIL) 12.5 MG tablet TAKE 1 TABLET BY MOUTH DAILY   metoprolol succinate (TOPROL-XL) 25 MG 24 hr tablet TAKE 1 TABLET BY MOUTH ONCE  DAILY   Multiple Vitamin (MULTIVITAMIN) tablet Take 1 tablet by mouth M,W,F    omeprazole (PRILOSEC) 20 MG capsule TAKE 1 CAPSULE BY MOUTH  DAILY   OVER THE COUNTER MEDICATION Probiotic 10  - 1 tablet / capsule daily   polyethylene glycol (MIRALAX / GLYCOLAX) 17 g, Oral, Every other day       Objective:   Physical Exam BP 132/68   Pulse 86   Temp 97.7 F (36.5 C) (Oral)   Resp 16   Ht '5\' 4"'$  (1.626 m)   Wt 151 lb (68.5 kg)   SpO2 98%   BMI 25.92 kg/m  General:   Well developed, NAD, BMI noted. HEENT:  Normocephalic . Face symmetric, atraumatic. Nose slightly congested, sinuses non-TTP Lungs:  CTA B Normal respiratory effort, no intercostal retractions, no accessory muscle use. Heart: RRR,  no murmur.  Lower extremities: no pretibial edema bilaterally  Skin: Not pale. Not jaundice Neurologic:  alert & oriented X3.  Speech normal, gait appropriate for age and unassisted Psych--  Cognition and judgment appear intact.  Cooperative with normal attention span and concentration.  Behavior appropriate. No anxious or depressed appearing.      Assessment    Assessment HTN Hyperlipidemia Leg cramps-- lipitor d/c in the past, cramps decreased, back on lipitor as cramps are  mild and benefits > risks. CoQ10 helps, CKs normal. rx robaxin 03-2015 Prostate cancer-- prostatectomy 05-2007, urology stopped f/u,  PSA checked by PCP SKIN: ---[facility] ---Melanoma, R leg , excision~ July 2020 [facility] GI: GERD,  --Dysphagia-- Rx  EGD 02-2014 (declined,sx chronic, mild) --H. pylori positive, re-checked breathing test negative 06-2015 --Chronic mild constipation, fecal impaction 2012 (admited per pt) and 01-2015 (ER): on MiraLAX qd, senokot or lactulose prn HOH   CP- saw cards, (-) stress test (EET) 10-2015 Covid + ~ 10-2020, no Rx   PLAN: Allergic sinusitis: Symptoms are on and off, typically well controlled if he uses both Flonase and Astepro. Plan: Continue nasal sprays, for times of high symptoms use both sprays, for times of mild symptoms use Flonase only. Cough:  Started 2 weeks ago, overall improving, exam is benign today.  Patient is concerned about this cough be related to RSV infection, I cannot make that determination but he seems to be doing well.  Recommend to continue OTCs, call if not better in the next few days.  Zithromax?. RTC CPX next month recommended

## 2021-11-27 ENCOUNTER — Other Ambulatory Visit (INDEPENDENT_AMBULATORY_CARE_PROVIDER_SITE_OTHER): Payer: Medicare Other

## 2021-11-27 DIAGNOSIS — R739 Hyperglycemia, unspecified: Secondary | ICD-10-CM

## 2021-11-27 DIAGNOSIS — I10 Essential (primary) hypertension: Secondary | ICD-10-CM | POA: Diagnosis not present

## 2021-11-27 DIAGNOSIS — E038 Other specified hypothyroidism: Secondary | ICD-10-CM | POA: Diagnosis not present

## 2021-11-27 LAB — COMPREHENSIVE METABOLIC PANEL
ALT: 17 U/L (ref 0–53)
AST: 22 U/L (ref 0–37)
Albumin: 4.3 g/dL (ref 3.5–5.2)
Alkaline Phosphatase: 74 U/L (ref 39–117)
BUN: 15 mg/dL (ref 6–23)
CO2: 32 mEq/L (ref 19–32)
Calcium: 9.5 mg/dL (ref 8.4–10.5)
Chloride: 96 mEq/L (ref 96–112)
Creatinine, Ser: 1.06 mg/dL (ref 0.40–1.50)
GFR: 63.06 mL/min (ref >60.00)
Glucose, Bld: 89 mg/dL (ref 70–99)
Potassium: 4.5 mEq/L (ref 3.5–5.1)
Sodium: 135 mEq/L (ref 135–145)
Total Bilirubin: 0.6 mg/dL (ref 0.2–1.2)
Total Protein: 7.4 g/dL (ref 6.0–8.3)

## 2021-11-27 LAB — TSH: TSH: 2.77 u[IU]/mL (ref 0.35–5.50)

## 2021-11-27 LAB — HEMOGLOBIN A1C: Hgb A1c MFr Bld: 5.5 % (ref 4.6–6.5)

## 2021-11-27 LAB — T4, FREE: Free T4: 0.68 ng/dL (ref 0.60–1.60)

## 2021-12-04 ENCOUNTER — Ambulatory Visit (HOSPITAL_BASED_OUTPATIENT_CLINIC_OR_DEPARTMENT_OTHER)
Admission: RE | Admit: 2021-12-04 | Discharge: 2021-12-04 | Disposition: A | Discharge status: Home / Self Care | Payer: Medicare Other | Source: Ambulatory Visit | Attending: Internal Medicine | Admitting: Internal Medicine

## 2021-12-04 ENCOUNTER — Ambulatory Visit (INDEPENDENT_AMBULATORY_CARE_PROVIDER_SITE_OTHER): Payer: Medicare Other | Admitting: Internal Medicine

## 2021-12-04 ENCOUNTER — Encounter: Payer: Self-pay | Admitting: Internal Medicine

## 2021-12-04 VITALS — BP 120/74 | HR 62 | Temp 97.6°F | Resp 16 | Ht 64.0 in | Wt 150.5 lb

## 2021-12-04 DIAGNOSIS — R052 Subacute cough: Secondary | ICD-10-CM | POA: Diagnosis present

## 2021-12-04 DIAGNOSIS — Z0000 Encounter for general adult medical examination without abnormal findings: Secondary | ICD-10-CM

## 2021-12-04 DIAGNOSIS — Z23 Encounter for immunization: Secondary | ICD-10-CM | POA: Diagnosis not present

## 2021-12-04 NOTE — Progress Notes (Unsigned)
Subjective:    Patient ID: Michael Johns, male    DOB: 08-15-1932, 88 y.o.   MRN: [id number]  DOS:  12/04/2021 Type of visit - description: CPX  Here for CPX In general feeling well. Was recently seen with allergies, and cough. Nose spray have significantly improved his nasal congestion. Cough is much improved, very seldom he brings up a small amount of clear sputum.  Occasional wheezing. No fever chills. Occasional urinary leak mostly with exertion.  Review of Systems See above   Past Medical History:  Diagnosis Date   Abdominal pain 11/09   due to constipation and ?pancreatitis- admitted   ADENOCARCINOMA, PROSTATE 07/01/2006   Atypical chest pain 04/01/2015   Constipation    chronic, impaction ~2012 (admited) and 01-2015   GERD (gastroesophageal reflux disease)    Gout    see 09-2008 note   Hearing decreased    right, 40% x years   HYPERLIPIDEMIA, MILD 07/18/2009   HYPERTENSION 05/19/2006   Melanoma ([place])     Past Surgical History:  Procedure Laterality Date   APPENDECTOMY     cataracts Bilateral 2014   lens implantinfo scanned (02-09-17)   INGUINAL HERNIA REPAIR Right 11/2007   MELANOMA EXCISION Right 05/12/2018   R leg melanoma. Dr  Winifred Olive at the [facility] in [place].   POLYPECTOMY     PROSTATECTOMY  05/2007   LADS (-) Bone scan (-)   Rt middle ear surgery     sinus polyp removed     TONSILLECTOMY      Current Outpatient Medications  Medication Instructions   amLODipine (NORVASC) 5 MG tablet TAKE 1 TABLET BY MOUTH DAILY   atorvastatin (LIPITOR) 10 MG tablet TAKE 1 TABLET BY MOUTH DAILY   azelastine (ASTELIN) 0.1 % nasal spray 2 sprays, Each Nare, 2 times daily, Use in each nostril as directed   B COMPLEX-BIOTIN-FA PO 1 tablet, Oral, Daily, On Tuesday, Thursday, Saturday and Sunday   ENULOSE 10 GM/15ML SOLN TAKE 45 MLS BY MOUTH DAILY  AS NEEDED   fluticasone (FLONASE) 50 MCG/ACT nasal spray 2 sprays, Each Nare, Daily    hydrochlorothiazide (HYDRODIURIL) 12.5 MG tablet TAKE 1 TABLET BY MOUTH DAILY   metoprolol succinate (TOPROL-XL) 25 MG 24 hr tablet TAKE 1 TABLET BY MOUTH ONCE  DAILY   Multiple Vitamin (MULTIVITAMIN) tablet Take 1 tablet by mouth M,W,F   omeprazole (PRILOSEC) 20 MG capsule TAKE 1 CAPSULE BY MOUTH  DAILY   OVER THE COUNTER MEDICATION Probiotic 10  - 1 tablet / capsule daily   polyethylene glycol (MIRALAX / GLYCOLAX) 17 g, Oral, Every other day       Objective:   Physical Exam BP 120/74   Pulse 62   Temp 97.6 F (36.4 C) (Oral)   Resp 16   Ht '5\' 4"'$  (1.626 m)   Wt 150 lb 8 oz (68.3 kg)   SpO2 97%   BMI 25.83 kg/m  General: Well developed, NAD, BMI noted Neck: No  thyromegaly  HEENT:  Normocephalic . Face symmetric, atraumatic Lungs:  Slightly increased expiratory time, few scattered rhonchi with cough only Normal respiratory effort, no intercostal retractions, no accessory muscle use. Heart: RRR,  no murmur.  Abdomen:  Not distended, soft, non-tender. No rebound or rigidity.   Lower extremities: no pretibial edema bilaterally  Skin: Exposed areas without rash. Not pale. Not jaundice Neurologic:  alert & oriented X3.  Speech normal, gait appropriate for age and unassisted Strength symmetric and appropriate for  age.  Psych: Cognition and judgment appear intact.  Cooperative with normal attention span and concentration.  Behavior appropriate. No anxious or depressed appearing.     Assessment     Assessment HTN Hyperlipidemia Leg cramps-- lipitor d/c in the past, cramps decreased, back on lipitor as cramps are  mild and benefits > risks. CoQ10 helps, CKs normal. rx robaxin 03-2015 Prostate cancer-- prostatectomy 05-2007, urology stopped f/u,  PSA checked by PCP SKIN: ---[facility]CC ---Melanoma, R leg , excision~ July 2020 [facility] GI: GERD,  --Dysphagia-- Rx  EGD 02-2014 (declined,sx chronic, mild) --H. pylori positive, re-checked breathing test negative 06-2015 --Chronic mild  constipation, fecal impaction 2012 (admited per pt) and 01-2015 (ER): on MiraLAX qd, senokot or lactulose prn HOH   CP- saw cards, (-) stress test (EET) 10-2015 Covid + ~ 10-2020, no Rx   PLAN: Here for CPX HTN, hyperlipidemia: Well-controlled on current medications.  Recent labs normal. Prostate cancer, history of: last PSA 0.  Occasional urinary leak. Allergic sinusitis: Much improved, we agreed to continue Flonase and Astepro Cough: Since the last visit the cough is better however he still have some cough and chest congestion. On exam, expiratory time is increased and he has few rhonchi. We agree on take a chest x-ray and consider further evaluation. RTC 6 months   --Td 2023 - pneumonia shot 2009, 2019;  prevnar 2015. PNM 20: Today -zostavax 2-09;  s/p  shingrex x 2. Completed  - RSV d/w pt -Covid vax : UTD - had a flu shot --CCS: Aged out. --History of prostate cancer, recent PSA 0.    --Diet, exercise: counseled -Recent labs reviewed, all okay. - POA on file       Allergic sinusitis: Symptoms are on and off, typically well controlled if he uses both Flonase and Astepro. Plan: Continue nasal sprays, for times of high symptoms use both sprays, for times of mild symptoms use Flonase only. Cough: Started 2 weeks ago, overall improving, exam is benign today.  Patient is concerned about this cough be related to RSV infection, I cannot make that determination but he seems to be doing well.  Recommend to continue OTCs, call if not better in the next few days.  Zithromax?. RTC CPX next month recommended

## 2021-12-04 NOTE — Patient Instructions (Addendum)
Go to the first floor, get your x-ray  Vaccines I recommend:  RSV vaccine   GO TO THE FRONT DESK, PLEASE SCHEDULE YOUR APPOINTMENTS Come back for   a checkup in 6 months

## 2021-12-05 ENCOUNTER — Encounter: Payer: Self-pay | Admitting: Internal Medicine

## 2021-12-05 NOTE — Assessment & Plan Note (Signed)
--  Td 2023 - pneumonia shot 2009, 2019;  prevnar 2015. PNM 20: Today -zostavax 2-09;  s/p  shingrex x 2. Completed  - RSV d/w pt -Covid vax : UTD - had a flu shot --CCS: Aged out. --History of prostate cancer, recent PSA 0.    --Diet, exercise: counseled -Recent labs reviewed, all okay. - POA on file

## 2021-12-05 NOTE — Assessment & Plan Note (Signed)
Here for CPX HTN, hyperlipidemia: Well-controlled on current medications.  Recent labs normal. Prostate cancer, history of: last PSA 0.  Occasional urinary leak. Allergic sinusitis: Much improved, we agreed to continue Flonase and Astepro Cough: Since the last visit the cough is better however he still have some cough and chest congestion. On exam, expiratory time is increased and he has few rhonchi. No h/o asthma or tobacco We agree on take a chest x-ray and consider further evaluation. RTC 6 months

## 2021-12-12 ENCOUNTER — Encounter: Payer: Self-pay | Admitting: Internal Medicine

## 2021-12-28 ENCOUNTER — Other Ambulatory Visit: Payer: Self-pay | Admitting: Internal Medicine

## 2022-01-01 ENCOUNTER — Other Ambulatory Visit (HOSPITAL_BASED_OUTPATIENT_CLINIC_OR_DEPARTMENT_OTHER): Payer: Self-pay

## 2022-01-01 MED ORDER — AREXVY 120 MCG/0.5ML IM SUSR
INTRAMUSCULAR | 0 refills | Status: DC
  Filled 2022-01-01: qty 0.5, 1d supply, fill #0

## 2022-01-07 ENCOUNTER — Other Ambulatory Visit (HOSPITAL_BASED_OUTPATIENT_CLINIC_OR_DEPARTMENT_OTHER): Payer: Self-pay

## 2022-01-27 ENCOUNTER — Encounter: Payer: Self-pay | Admitting: Internal Medicine

## 2022-01-27 DIAGNOSIS — M25561 Pain in right knee: Secondary | ICD-10-CM

## 2022-03-17 ENCOUNTER — Other Ambulatory Visit: Payer: Self-pay | Admitting: Internal Medicine

## 2022-04-02 ENCOUNTER — Other Ambulatory Visit (HOSPITAL_BASED_OUTPATIENT_CLINIC_OR_DEPARTMENT_OTHER): Payer: Self-pay

## 2022-04-02 MED ORDER — CHLORHEXIDINE GLUCONATE 0.12 % MT SOLN
OROMUCOSAL | 0 refills | Status: DC
  Filled 2022-04-02: qty 473, 30d supply, fill #0

## 2022-06-04 ENCOUNTER — Ambulatory Visit: Payer: Medicare Other | Admitting: Internal Medicine

## 2022-06-09 ENCOUNTER — Ambulatory Visit (INDEPENDENT_AMBULATORY_CARE_PROVIDER_SITE_OTHER): Payer: Medicare Other | Admitting: Internal Medicine

## 2022-06-09 VITALS — BP 116/58 | HR 63 | Temp 97.8°F | Resp 18 | Ht 64.0 in | Wt 152.5 lb

## 2022-06-09 DIAGNOSIS — Z8546 Personal history of malignant neoplasm of prostate: Secondary | ICD-10-CM | POA: Diagnosis not present

## 2022-06-09 DIAGNOSIS — M779 Enthesopathy, unspecified: Secondary | ICD-10-CM | POA: Diagnosis not present

## 2022-06-09 DIAGNOSIS — E782 Mixed hyperlipidemia: Secondary | ICD-10-CM | POA: Diagnosis not present

## 2022-06-09 DIAGNOSIS — I10 Essential (primary) hypertension: Secondary | ICD-10-CM | POA: Diagnosis not present

## 2022-06-09 NOTE — Progress Notes (Addendum)
Subjective:    Patient ID: Michael Johns, male    DOB: [birth year], 89 y.o.   MRN: [id number]  DOS:  06/09/2022 Type of visit - description: Follow-up follow-up  Today we talk about his chronic medical problems. Ambulatory BPs are normal. He denies chest pain or difficulty breathing.  No dysuria or gross hematuria.  Also, for the last 2 weeks has noted pain at the base of the fourth right finger, sometimes it gets locked.  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 Dr  Jeannine B[facility] at [facility] in [place].   POLYPECTOMY     PROSTATECTOMY  05/2007   LADS (-) Bone scan (-)   Rt middle ear surgery     sinus polyp removed     TONSILLECTOMY      Current Outpatient Medications  Medication Instructions   amLODipine (NORVASC) 5 MG tablet TAKE 1 TABLET BY MOUTH DAILY   atorvastatin (LIPITOR) 10 MG tablet TAKE 1 TABLET BY MOUTH DAILY   azelastine (ASTELIN) 0.1 % nasal spray 2 sprays, Each Nare, 2 times daily, Use in each nostril as directed   B COMPLEX-BIOTIN-FA PO 1 tablet, Oral, Daily, On Tuesday, Thursday, Saturday and Sunday   chlorhexidine (PERIDEX) 0.12 % solution Flush area 2 times a day for 7 to 10 days   fluticasone (FLONASE) 50 MCG/ACT nasal spray 2 sprays, Each Nare, Daily   hydrochlorothiazide  (HYDRODIURIL) 12.5 MG tablet TAKE 1 TABLET BY MOUTH DAILY   lactulose, encephalopathy, (ENULOSE) 10 GM/15ML SOLN TAKE 45 MLS BY MOUTH DAILY  AS NEEDED   metoprolol succinate (TOPROL-XL) 25 MG 24 hr tablet TAKE 1 TABLET BY MOUTH ONCE  DAILY   Multiple Vitamin (MULTIVITAMIN) tablet Take 1 tablet by mouth M,W,F   omeprazole (PRILOSEC) 20 MG capsule TAKE 1 CAPSULE BY MOUTH DAILY   OVER THE COUNTER MEDICATION Probiotic 10  - 1 tablet / capsule daily   polyethylene glycol (MIRALAX / GLYCOLAX) 17 g, Oral, Every other day       Objective:   Physical Exam BP (!) 116/58   Pulse 63   Temp 97.8 F (36.6 C) (Oral)   Resp 18   Ht 5\' 4"  (1.626 m)   Wt 152 lb 8 oz (69.2 kg)   SpO2 97%   BMI 26.18 kg/m  General:   Well developed, NAD, BMI noted. HEENT:  Normocephalic . Face symmetric, atraumatic Lungs:  CTA B Normal respiratory effort, no intercostal retractions, no accessory muscle use. Heart: RRR,  no murmur.  Lower extremities: no pretibial edema bilaterally Hands: Normal bilaterally except for mild TTP at the base of the fourth R finger (palm).  No induration.  Skin: Not pale. Not jaundice Neurologic:  alert & oriented X3.  Speech normal,  gait appropriate for age and unassisted Psych--  Cognition and judgment appear intact.  Cooperative with normal attention span and concentration.  Behavior appropriate. No anxious or depressed appearing.      Assessment     Assessment HTN Hyperlipidemia Leg cramps-- lipitor d/c in the past, cramps decreased, back on lipitor as cramps are mild ; benefits > risks. CoQ10 helps, CKs normal. rx robaxin 03-2015 Prostate cancer-- prostatectomy 05-2007, urology stopped f/u,  PSA checked by PCP SKIN: ---[facility]CC ---Melanoma, R leg , excision~ July 2020 [facility] GI: GERD,  --Dysphagia-- Rx  EGD 02-2014 (declined,sx chronic, mild) --H. pylori positive, re-checked breathing test negative 06-2015 --Chronic mild constipation, fecal impaction 2012 (admited per pt)  and 01-2015 (ER): on MiraLAX qd, senokot or lactulose prn HOH   CP- saw cards, (-) stress test (EET) 10-2015   PLAN: HTN: BP today is very good, ambulatory BPs checked ~ once a month,    normal readings.  Continue amlodipine, HCTZ, metoprolol.  Check BMP and CBC. High cholesterol: On atorvastatin.  Check FLP. History of prostate cancer: Check PSA Tendinitis: Pain and occasional trigger phenomena at the right fourth finger, exam is benign except mild tenderness.  Recommend observation, if not better he will let me know, Ortho referral? History of melanoma.  Sees dermatology yearly. RTC labs this week RTC CPX 11/2022

## 2022-06-09 NOTE — Patient Instructions (Addendum)
Check the  blood pressure regularly BP GOAL is between 110/65 and  135/85. If it is consistently higher or lower, let me know       GO TO THE FRONT DESK, PLEASE SCHEDULE YOUR APPOINTMENTS  Come back for blood work at your earliest convenience, fasting.  Please make an appointment.  Come back for a physical by November 2024

## 2022-06-09 NOTE — Assessment & Plan Note (Signed)
HTN: BP today is very good, ambulatory BPs checked ~ once a month,    normal readings.  Continue amlodipine, HCTZ, metoprolol.  Check BMP and CBC. High cholesterol: On atorvastatin.  Check FLP. History of prostate cancer: Check PSA Tendinitis: Pain and occasional trigger phenomena at the right fourth finger, exam is benign except mild tenderness.  Recommend observation, if not better he will let me know, Ortho referral? History of melanoma.  Sees dermatology yearly. RTC labs this week RTC CPX 11/2022

## 2022-06-11 ENCOUNTER — Other Ambulatory Visit: Payer: Medicare Other

## 2022-06-12 ENCOUNTER — Other Ambulatory Visit (INDEPENDENT_AMBULATORY_CARE_PROVIDER_SITE_OTHER): Payer: Medicare Other

## 2022-06-12 DIAGNOSIS — I10 Essential (primary) hypertension: Secondary | ICD-10-CM

## 2022-06-12 DIAGNOSIS — E782 Mixed hyperlipidemia: Secondary | ICD-10-CM

## 2022-06-12 DIAGNOSIS — Z8546 Personal history of malignant neoplasm of prostate: Secondary | ICD-10-CM

## 2022-06-12 LAB — CBC WITH DIFFERENTIAL/PLATELET
Basophils Absolute: 0.1 10*3/uL (ref 0.0–0.1)
Basophils Relative: 0.8 % (ref 0.0–3.0)
Eosinophils Absolute: 0.4 10*3/uL (ref 0.0–0.7)
Eosinophils Relative: 4.3 % (ref 0.0–5.0)
HCT: 39.8 % (ref 39.0–52.0)
Hemoglobin: 14 g/dL (ref 13.0–17.0)
Lymphocytes Relative: 31.6 % (ref 12.0–46.0)
Lymphs Abs: 2.7 10*3/uL (ref 0.7–4.0)
MCHC: 35.1 g/dL (ref 30.0–36.0)
MCV: 90.1 fl (ref 78.0–100.0)
Monocytes Absolute: 1.1 10*3/uL — ABNORMAL HIGH (ref 0.1–1.0)
Monocytes Relative: 13 % — ABNORMAL HIGH (ref 3.0–12.0)
Neutro Abs: 4.4 10*3/uL (ref 1.4–7.7)
Neutrophils Relative %: 50.3 % (ref 43.0–77.0)
Platelets: 240 10*3/uL (ref 150.0–400.0)
RBC: 4.41 Mil/uL (ref 4.22–5.81)
RDW: 14.5 % (ref 11.5–15.5)
WBC: 8.7 10*3/uL (ref 4.0–10.5)

## 2022-06-12 LAB — LIPID PANEL
Cholesterol: 143 mg/dL (ref 0–200)
HDL: 42.1 mg/dL (ref >39.00)
LDL Cholesterol: 71 mg/dL (ref 0–99)
NonHDL: 100.41
Total CHOL/HDL Ratio: 3
Triglycerides: 149 mg/dL (ref 0.0–149.0)
VLDL: 29.8 mg/dL (ref 0.0–40.0)

## 2022-06-12 LAB — BASIC METABOLIC PANEL
BUN: 18 mg/dL (ref 6–23)
CO2: 31 mEq/L (ref 19–32)
Calcium: 9.6 mg/dL (ref 8.4–10.5)
Chloride: 95 mEq/L — ABNORMAL LOW (ref 96–112)
Creatinine, Ser: 1.18 mg/dL (ref 0.40–1.50)
GFR: 55.23 mL/min — ABNORMAL LOW (ref >60.00)
Glucose, Bld: 91 mg/dL (ref 70–99)
Potassium: 4.6 mEq/L (ref 3.5–5.1)
Sodium: 134 mEq/L — ABNORMAL LOW (ref 135–145)

## 2022-06-12 LAB — PSA: PSA: 0 ng/mL — ABNORMAL LOW (ref 0.10–4.00)

## 2022-06-22 ENCOUNTER — Encounter: Payer: Self-pay | Admitting: Internal Medicine

## 2022-06-22 DIAGNOSIS — M778 Other enthesopathies, not elsewhere classified: Secondary | ICD-10-CM

## 2022-06-30 ENCOUNTER — Encounter: Payer: Self-pay | Admitting: Internal Medicine

## 2022-09-23 ENCOUNTER — Encounter: Payer: Self-pay | Admitting: Internal Medicine

## 2022-09-23 ENCOUNTER — Ambulatory Visit (INDEPENDENT_AMBULATORY_CARE_PROVIDER_SITE_OTHER): Payer: Medicare Other | Admitting: Internal Medicine

## 2022-09-23 ENCOUNTER — Other Ambulatory Visit (HOSPITAL_BASED_OUTPATIENT_CLINIC_OR_DEPARTMENT_OTHER): Payer: Self-pay

## 2022-09-23 VITALS — BP 130/60 | HR 58 | Temp 97.6°F | Resp 16 | Ht 64.0 in | Wt 150.0 lb

## 2022-09-23 DIAGNOSIS — R21 Rash and other nonspecific skin eruption: Secondary | ICD-10-CM

## 2022-09-23 MED ORDER — BETAMETHASONE DIPROPIONATE AUG 0.05 % EX CREA
TOPICAL_CREAM | Freq: Two times a day (BID) | CUTANEOUS | 0 refills | Status: DC
  Filled 2022-09-23: qty 60, 15d supply, fill #0

## 2022-09-23 MED ORDER — KETOCONAZOLE 2 % EX CREA
1.0000 | TOPICAL_CREAM | Freq: Every day | CUTANEOUS | 0 refills | Status: DC
  Filled 2022-09-23: qty 60, 30d supply, fill #0

## 2022-09-23 NOTE — Patient Instructions (Addendum)
Ketoconazole: Twice daily for 10 days Apply to the the right leg, right arm and right groin   Betamethasone: Twice daily to everyplace that itches   Watch for poison ivy when you play golf.  You can take Zyrtec 10 mg over-the-counter: 1 tablet daily for itching.  Call if not gradually better

## 2022-09-23 NOTE — Progress Notes (Unsigned)
Subjective:    Patient ID: Michael Johns, male    DOB: [birth year], 89 y.o.   MRN: [id number]  DOS:  09/23/2022 Type of visit - description: acute  Reports a rash at the right leg, right suprapubic area for "2 to 3 months". Also having other rashes for the last 1 to 2 weeks at different places including the right arm. All of them itch.  No fever or chills No new prescriptions medicine or OTCs. He does play golf. Wife is not affected by it. 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 Dr  Jeannine Boga at [facility] in [place].   POLYPECTOMY     PROSTATECTOMY  05/2007   LADS (-) Bone scan (-)   Rt middle ear surgery     sinus polyp removed     TONSILLECTOMY      Current Outpatient Medications  Medication Instructions   amLODipine (NORVASC) 5 MG tablet TAKE 1 TABLET BY MOUTH DAILY   atorvastatin (LIPITOR) 10 MG tablet TAKE 1 TABLET BY MOUTH DAILY   azelastine (ASTELIN) 0.1 % nasal spray 2 sprays, Each Nare, 2 times daily, Use in each nostril as directed   B COMPLEX-BIOTIN-FA PO 1 tablet, Oral, Daily, On Tuesday, Thursday, Saturday and Sunday   chlorhexidine (PERIDEX) 0.12 % solution Flush area 2 times a day for 7 to 10 days   fluticasone (FLONASE) 50 MCG/ACT nasal spray 2 sprays, Each Nare, Daily    hydrochlorothiazide (HYDRODIURIL) 12.5 MG tablet TAKE 1 TABLET BY MOUTH DAILY   lactulose, encephalopathy, (ENULOSE) 10 GM/15ML SOLN TAKE 45 MLS BY MOUTH DAILY  AS NEEDED   metoprolol succinate (TOPROL-XL) 25 MG 24 hr tablet TAKE 1 TABLET BY MOUTH ONCE  DAILY   Multiple Vitamin (MULTIVITAMIN) tablet Take 1 tablet by mouth M,W,F   omeprazole (PRILOSEC) 20 MG capsule TAKE 1 CAPSULE BY MOUTH DAILY   OVER THE COUNTER MEDICATION Probiotic 10  - 1 tablet / capsule daily   polyethylene glycol (MIRALAX / GLYCOLAX) 17 g, Oral, Every other day       Objective:   Physical Exam BP 130/60   Pulse (!) 58   Temp 97.6 F (36.4 C) (Oral)   Resp 16   Ht 5\' 4"  (1.626 m)   Wt 150 lb (68 kg)   SpO2 97%   BMI 25.75 kg/m  General:   Well developed, NAD, BMI noted. HEENT:  Normocephalic . Face symmetric, atraumatic   Skin: Has a patch of erythema at the right leg, see picture. Similar-appearing patch of erythema at the right suprapubic area, approximately 1 inch across. At the right arm has a different rash.  See picture Has other scattered with small erythematous lesion, some of them slightly flaky. Neurologic:  alert &  oriented X3.  Speech normal, gait appropriate for age and unassisted Psych--  Cognition and judgment appear intact.  Cooperative with normal attention span and concentration.  Behavior appropriate. No anxious or depressed appearing. Right arm    Right leg      Assessment     Assessment HTN Hyperlipidemia Leg cramps-- lipitor d/c in the past, cramps decreased, back on lipitor as cramps are mild ; benefits > risks. CoQ10 helps, CKs normal. rx robaxin 03-2015 Prostate cancer-- prostatectomy 05-2007, urology stopped f/u,  PSA checked by PCP SKIN: ---[facility]CC ---Melanoma, R leg , excision~ July 2020 [facility] GI: GERD,  --Dysphagia-- Rx  EGD 02-2014 (declined,sx chronic, mild) --H. pylori positive, re-checked breathing test negative 06-2015 --Chronic mild constipation, fecal  impaction 2012 (admited per pt) and 01-2015 (ER): on MiraLAX qd, senokot or lactulose prn HOH   CP- saw cards, (-) stress test (EET) 10-2015   PLAN: Rash: Has a rash that is going on for 2 to 3 months, possibly fungal.  Prescribe ketoconazole. Has other more recent rashes that itch significantly, contact dermatitis?. Plan: See instructions, will prescribe ketoconazole and betamethasone.  Also Zyrtec.  Call if not better.  He will see his dermatologist in about a month.    5-13 HTN: BP today is very good, ambulatory BPs checked ~ once a month,    normal readings.  Continue amlodipine, HCTZ, metoprolol.  Check BMP and CBC. High cholesterol: On atorvastatin.  Check FLP. History of prostate cancer: Check PSA Tendinitis: Pain and occasional trigger phenomena at the right fourth finger, exam is benign except mild tenderness.  Recommend observation, if not better he will let me know, Ortho referral? History of melanoma.  Sees dermatology yearly. RTC labs this week RTC CPX 11/2022

## 2022-09-24 ENCOUNTER — Other Ambulatory Visit (HOSPITAL_BASED_OUTPATIENT_CLINIC_OR_DEPARTMENT_OTHER): Payer: Self-pay

## 2022-09-24 NOTE — Assessment & Plan Note (Signed)
Rash: Has a rash that is going on for 2 to 3 months, possibly fungal.  Prescribe ketoconazole. Has other more recent rashes that itch significantly, contact dermatitis?. Plan: See instructions, will prescribe ketoconazole and betamethasone.  Also Zyrtec.  Call if not better.  He will see his dermatologist in about a month.

## 2022-10-29 ENCOUNTER — Other Ambulatory Visit: Payer: Self-pay

## 2022-10-29 ENCOUNTER — Other Ambulatory Visit (HOSPITAL_BASED_OUTPATIENT_CLINIC_OR_DEPARTMENT_OTHER): Payer: Self-pay

## 2022-10-29 MED ORDER — BETAMETHASONE DIPROPIONATE AUG 0.05 % EX CREA
1.0000 | TOPICAL_CREAM | Freq: Two times a day (BID) | CUTANEOUS | 0 refills | Status: DC
  Filled 2022-10-29: qty 45, 15d supply, fill #0

## 2022-10-29 MED ORDER — CLOBETASOL PROPIONATE 0.05 % EX SHAM
1.0000 | MEDICATED_SHAMPOO | Freq: Every day | CUTANEOUS | 1 refills | Status: DC
  Filled 2022-10-29: qty 118, 30d supply, fill #0

## 2022-10-30 ENCOUNTER — Other Ambulatory Visit (HOSPITAL_BASED_OUTPATIENT_CLINIC_OR_DEPARTMENT_OTHER): Payer: Self-pay

## 2022-11-17 ENCOUNTER — Other Ambulatory Visit (HOSPITAL_BASED_OUTPATIENT_CLINIC_OR_DEPARTMENT_OTHER): Payer: Self-pay

## 2022-11-17 ENCOUNTER — Other Ambulatory Visit: Payer: Self-pay | Admitting: Family

## 2022-11-17 MED ORDER — INFLUENZA VAC A&B SURF ANT ADJ 0.5 ML IM SUSY
0.5000 mL | PREFILLED_SYRINGE | Freq: Once | INTRAMUSCULAR | 0 refills | Status: AC
  Filled 2022-11-17: qty 0.5, 1d supply, fill #0

## 2022-11-23 ENCOUNTER — Encounter: Payer: Self-pay | Admitting: Internal Medicine

## 2022-11-24 MED ORDER — ATORVASTATIN CALCIUM 10 MG PO TABS: 10.0000 mg | ORAL_TABLET | Freq: Every day | ORAL | 1 refills | Status: DC

## 2022-11-24 MED ORDER — AMLODIPINE BESYLATE 5 MG PO TABS: 5.0000 mg | ORAL_TABLET | Freq: Every day | ORAL | 1 refills | Status: DC

## 2022-11-24 MED ORDER — HYDROCHLOROTHIAZIDE 12.5 MG PO TABS: 12.5000 mg | ORAL_TABLET | Freq: Every day | ORAL | 1 refills | Status: DC

## 2022-12-08 ENCOUNTER — Other Ambulatory Visit (HOSPITAL_BASED_OUTPATIENT_CLINIC_OR_DEPARTMENT_OTHER): Payer: Self-pay

## 2022-12-08 MED ORDER — CLOBETASOL PROPIONATE 0.05 % EX SOLN
1.0000 | Freq: Every day | CUTANEOUS | 0 refills | Status: DC
  Filled 2022-12-08: qty 50, 50d supply, fill #0

## 2022-12-17 ENCOUNTER — Other Ambulatory Visit (HOSPITAL_BASED_OUTPATIENT_CLINIC_OR_DEPARTMENT_OTHER): Payer: Self-pay

## 2022-12-17 MED ORDER — RSVPREF3 VAC RECOMB ADJUVANTED 120 MCG/0.5ML IM SUSR
0.5000 mL | Freq: Once | INTRAMUSCULAR | 0 refills | Status: AC
  Filled 2022-12-17: qty 0.5, 1d supply, fill #0

## 2022-12-17 MED ORDER — COVID-19 MRNA VAC-TRIS(PFIZER) 30 MCG/0.3ML IM SUSY
0.3000 mL | PREFILLED_SYRINGE | Freq: Once | INTRAMUSCULAR | 0 refills | Status: AC
  Filled 2022-12-17: qty 0.3, 1d supply, fill #0

## 2022-12-23 ENCOUNTER — Encounter: Payer: Self-pay | Admitting: Internal Medicine

## 2022-12-23 ENCOUNTER — Ambulatory Visit: Payer: Medicare Other | Admitting: Internal Medicine

## 2022-12-23 VITALS — BP 122/68 | HR 55 | Temp 98.0°F | Resp 16 | Ht 64.0 in | Wt 149.0 lb

## 2022-12-23 DIAGNOSIS — I10 Essential (primary) hypertension: Secondary | ICD-10-CM

## 2022-12-23 DIAGNOSIS — E782 Mixed hyperlipidemia: Secondary | ICD-10-CM | POA: Diagnosis not present

## 2022-12-23 DIAGNOSIS — Z0000 Encounter for general adult medical examination without abnormal findings: Secondary | ICD-10-CM

## 2022-12-23 LAB — BASIC METABOLIC PANEL
BUN: 17 mg/dL (ref 6–23)
CO2: 32 meq/L (ref 19–32)
Calcium: 10.1 mg/dL (ref 8.4–10.5)
Chloride: 96 meq/L (ref 96–112)
Creatinine, Ser: 1.18 mg/dL (ref 0.40–1.50)
GFR: 55.03 mL/min — ABNORMAL LOW (ref >60.00)
Glucose, Bld: 70 mg/dL (ref 70–99)
Potassium: 4.4 meq/L (ref 3.5–5.1)
Sodium: 133 meq/L — ABNORMAL LOW (ref 135–145)

## 2022-12-23 LAB — ALT: ALT: 19 U/L (ref 0–53)

## 2022-12-23 LAB — AST: AST: 20 U/L (ref 0–37)

## 2022-12-23 NOTE — Assessment & Plan Note (Signed)
Here for CPX -Td 2023 - pneumonia shot 2009, 2019;  prevnar 2015. PNM 20: 2023 -zostavax 2-09;  s/p  shingrex x 2.  S/p RSV -Had a flu shot and  COVID-vaccine  --CCS: Aged out. --History of prostate cancer, 05/2022 = PSA 0.    --Diet, exercise: Doing great, active, plays golf, take walks, watching his diet, has been able to lose some weight as he liked. -Recent labs reviewed, will check a BMP AST ALT - POA on file

## 2022-12-23 NOTE — Assessment & Plan Note (Signed)
Here for CPX HTN, hyperlipidemia: Well-controlled, continue present meds RTC 6 months.

## 2022-12-23 NOTE — Patient Instructions (Signed)
It was good to see you today.   Check the  blood pressure regularly Blood pressure goal:  between 110/65 and  135/85. If it is consistently higher or lower, let me know     GO TO THE LAB : Get the blood work     Next visit with me in 6 months.    Please schedule it at the front desk

## 2022-12-23 NOTE — Progress Notes (Signed)
Subjective:    Patient ID: Michael Johns, male    DOB: 1932/12/22, 89 y.o.   MRN: [id number]  DOS:  12/23/2022 Type of visit - description: CPX  Here for CPX. He feels very well.  Plays golf twice weekly.  Active.  He is watching his diet more closely.   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 Dr  [facility] [place].   POLYPECTOMY     PROSTATECTOMY  05/2007   LADS (-) Bone scan (-)   Rt middle ear surgery     sinus polyp removed     TONSILLECTOMY     Social History   Socioeconomic History   Marital status: Married    Spouse name: Not on file   Number of children: 0   Years of education: Not on file   Highest education level: Bachelor's degree (e.g., BA, AB, BS)  Occupational History   Occupation: retired     Associate Professor: RETIRED  Tobacco Use   Smoking status: Never   Smokeless tobacco: Never  Substance and Sexual Activity   Alcohol use: Yes    Comment: socially    Drug use: No   Sexual activity: Not on file  Other Topics Concern   Not on file  Social History Narrative   Re-married, no children, [name] ,  present wife has 3 daughters      retired-- worked for the [employer]: Low Risk  (06/09/2022)   Overall Financial Resource Strain (CARDIA)    Difficulty of Paying Living Expenses: Not hard at all  Food Insecurity: No Food Insecurity (06/09/2022)   Hunger Vital Sign    Worried About Running Out of Food in the Last Year: Never true    Ran Out of Food in the Last Year: Never true  Transportation Needs: No Transportation Needs (06/09/2022)   PRAPARE - Administrator, Civil Service (Medical): No    Lack of Transportation (Non-Medical): No  Physical Activity: Insufficiently Active (06/09/2022)   Exercise Vital Sign    Days of Exercise per Week: 3 days    Minutes of Exercise per Session: 30 min  Stress: No Stress Concern Present (06/09/2022)   [employer] of Occupational Health - Occupational Stress Questionnaire    Feeling of Stress : Not at all  Social Connections: Socially Integrated (06/09/2022)   Social Connection and Isolation Panel [NHANES]    Frequency of Communication with Friends and Family: Twice a  week    Frequency of Social Gatherings with Friends and Family: More than three times a week    Attends Religious Services: More than 4 times per year    Active Member of [employer] or Organizations: Yes    Attends Banker Meetings: 1 to 4 times per year    Marital Status: Married  Catering manager Violence: Not on file    Current Outpatient Medications  Medication Instructions   amLODipine (NORVASC) 5 mg, Oral, Daily   atorvastatin (LIPITOR) 10 mg, Oral, Daily   B COMPLEX-BIOTIN-FA PO 1 tablet, Oral, Daily, On Tuesday, Thursday, Saturday and Sunday   clobetasol (TEMOVATE) 0.05 % external solution Apply 1 Application topically daily for 10 days   Clobetasol Propionate 0.05 % shampoo Apply 1 Application topically once daily for 10 days   hydrochlorothiazide (HYDRODIURIL) 12.5 mg, Oral, Daily   lactulose,  encephalopathy, (ENULOSE) 10 GM/15ML SOLN TAKE 45 MLS BY MOUTH DAILY  AS NEEDED   metoprolol succinate (TOPROL-XL) 25 MG 24 hr tablet TAKE 1 TABLET BY MOUTH ONCE  DAILY   Multiple Vitamin (MULTIVITAMIN) tablet Take 1 tablet by mouth M,W,F   omeprazole (PRILOSEC) 20 MG capsule TAKE 1 CAPSULE BY MOUTH DAILY   OVER THE COUNTER MEDICATION Probiotic 10  - 1 tablet / capsule daily   polyethylene glycol (MIRALAX / GLYCOLAX) 17 g, Oral, Every other day       Objective:   Physical Exam BP 122/68   Pulse (!) 55   Temp 98 F (36.7 C) (Oral)   Resp 16   Ht 5\' 4"  (1.626 m)   Wt 149 lb (67.6 kg)   SpO2 98%   BMI 25.58 kg/m  General: Well developed, NAD, BMI noted Neck: No  thyromegaly  HEENT:  Normocephalic . Face symmetric, atraumatic Lungs:  CTA B Normal respiratory effort, no intercostal retractions, no accessory muscle use. Heart: RRR,  no murmur.  Abdomen:  Not distended, soft, non-tender. No rebound or rigidity.   Lower extremities: no pretibial edema bilaterally  Skin: Exposed areas without rash. Not pale. Not jaundice Neurologic:  alert & oriented X3.  Speech normal, gait appropriate for age and unassisted Strength symmetric and appropriate for age.  Psych: Cognition and judgment appear intact.  Cooperative with normal attention span and concentration.  Behavior appropriate. No anxious or depressed appearing.     Assessment    Assessment HTN Hyperlipidemia Leg cramps-- lipitor d/c in the past, cramps decreased, back on lipitor as cramps are mild ; benefits > risks. CoQ10 helps, CKs normal. rx robaxin 03-2015 Prostate cancer-- prostatectomy 05-2007, urology stopped f/u,  PSA checked by PCP SKIN: ---SCC ---Melanoma, R leg , excision~ July 2020 [facility] GI: GERD,  --Dysphagia-- Rx  EGD 02-2014 (declined,sx chronic, mild) --H. pylori positive, re-checked breathing test negative 06-2015 --Chronic mild constipation, fecal impaction 2012 (admited per pt) and 01-2015 (ER): on  MiraLAX qd, senokot or lactulose prn HOH   CP- saw cards, (-) stress test (EET) 10-2015   PLAN: Here for CPX -Td 2023 - pneumonia shot 2009, 2019;  prevnar 2015. PNM 20: 2023 -zostavax 2-09;  s/p  shingrex x 2.  S/p RSV -Had a flu shot and  COVID-vaccine  --CCS: Aged out. --History of prostate cancer, 05/2022 = PSA 0.    --Diet, exercise: Doing great, active, plays golf, take walks, watching his diet, has been able to lose some weight as he liked. -Recent labs reviewed, will check a BMP AST ALT - POA on file  HTN, hyperlipidemia:  Well-controlled, continue present meds RTC 6 months.

## 2023-01-26 ENCOUNTER — Other Ambulatory Visit (HOSPITAL_BASED_OUTPATIENT_CLINIC_OR_DEPARTMENT_OTHER): Payer: Self-pay

## 2023-01-26 MED ORDER — BETAMETHASONE DIPROPIONATE AUG 0.05 % EX CREA
1.0000 | TOPICAL_CREAM | Freq: Two times a day (BID) | CUTANEOUS | 0 refills | Status: DC
  Filled 2023-01-26: qty 45, 15d supply, fill #0

## 2023-01-26 MED ORDER — CLOBETASOL PROPIONATE 0.05 % EX SOLN
1.0000 | Freq: Every day | CUTANEOUS | 0 refills | Status: DC
  Filled 2023-01-26: qty 50, 10d supply, fill #0

## 2023-01-27 ENCOUNTER — Other Ambulatory Visit (HOSPITAL_BASED_OUTPATIENT_CLINIC_OR_DEPARTMENT_OTHER): Payer: Self-pay

## 2023-01-29 ENCOUNTER — Other Ambulatory Visit (HOSPITAL_BASED_OUTPATIENT_CLINIC_OR_DEPARTMENT_OTHER): Payer: Self-pay

## 2023-02-11 ENCOUNTER — Encounter: Payer: Self-pay | Admitting: Internal Medicine

## 2023-02-19 ENCOUNTER — Other Ambulatory Visit (HOSPITAL_BASED_OUTPATIENT_CLINIC_OR_DEPARTMENT_OTHER): Payer: Self-pay

## 2023-02-23 ENCOUNTER — Other Ambulatory Visit: Payer: Self-pay

## 2023-02-23 ENCOUNTER — Other Ambulatory Visit (HOSPITAL_BASED_OUTPATIENT_CLINIC_OR_DEPARTMENT_OTHER): Payer: Self-pay

## 2023-02-23 MED ORDER — BETAMETHASONE DIPROPIONATE AUG 0.05 % EX CREA
1.0000 | TOPICAL_CREAM | Freq: Two times a day (BID) | CUTANEOUS | 0 refills | Status: DC
  Filled 2023-02-23: qty 50, 17d supply, fill #0

## 2023-03-01 ENCOUNTER — Other Ambulatory Visit: Payer: Self-pay | Admitting: Family

## 2023-03-04 ENCOUNTER — Encounter: Payer: Self-pay | Admitting: Internal Medicine

## 2023-03-04 ENCOUNTER — Other Ambulatory Visit (HOSPITAL_BASED_OUTPATIENT_CLINIC_OR_DEPARTMENT_OTHER): Payer: Self-pay

## 2023-03-04 MED ORDER — METOPROLOL SUCCINATE ER 25 MG PO TB24
25.0000 mg | ORAL_TABLET | Freq: Every day | ORAL | 1 refills | Status: DC
  Filled 2023-03-04: qty 90, 90d supply, fill #0

## 2023-03-11 ENCOUNTER — Other Ambulatory Visit (HOSPITAL_BASED_OUTPATIENT_CLINIC_OR_DEPARTMENT_OTHER): Payer: Self-pay

## 2023-03-11 MED ORDER — TRIAMCINOLONE ACETONIDE 0.1 % EX CREA
1.0000 | TOPICAL_CREAM | Freq: Two times a day (BID) | CUTANEOUS | 0 refills | Status: DC
  Filled 2023-03-11: qty 454, 30d supply, fill #0

## 2023-03-16 IMAGING — CT CT ABD-PELV W/ CM
2 of 5 series · 15 of 46 positions shown, 17 images · IV contrast (Omnipaque)
Comparison: Radiograph earlier today.  Remote CT 12/03/2018

CLINICAL DATA: Abdominal distension.  No bowel movement for 4 days.

EXAM:
CT ABDOMEN AND PELVIS WITH CONTRAST
TECHNIQUE: Multidetector CT imaging of the abdomen and pelvis was performed
using the standard protocol following bolus administration of
intravenous contrast.
CONTRAST:  100mL OMNIPAQUE IOHEXOL 300 MG/ML  SOLN

[Series 2: axial st · axial · 0.82mm/px · z∈[+779,+1184]mm · 12 of 91 slices shown, 14 images]
[im 5/91  soft-tissue]
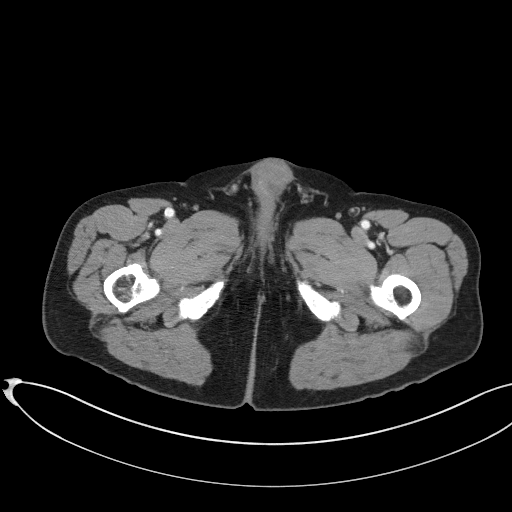
[im 5/91  bone]
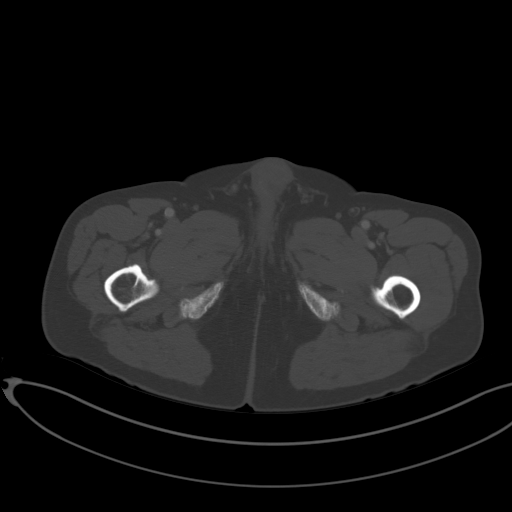
[im 14/91  soft-tissue]
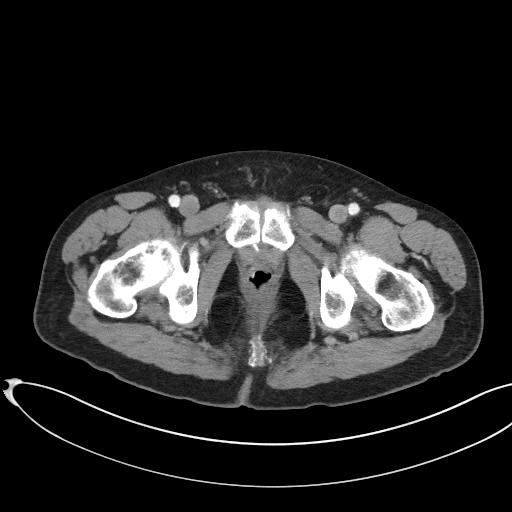
[im 19/91  soft-tissue]
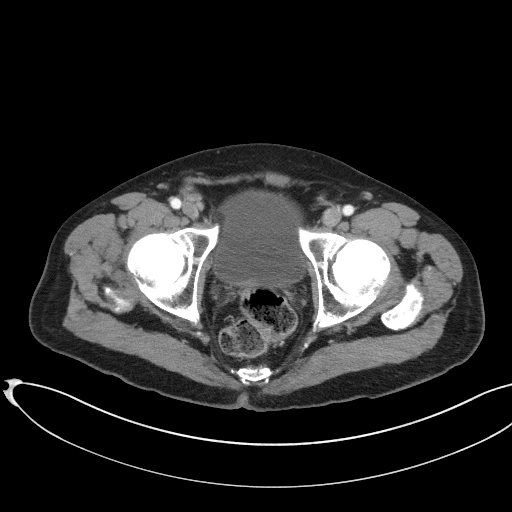
[im 28/91  soft-tissue]
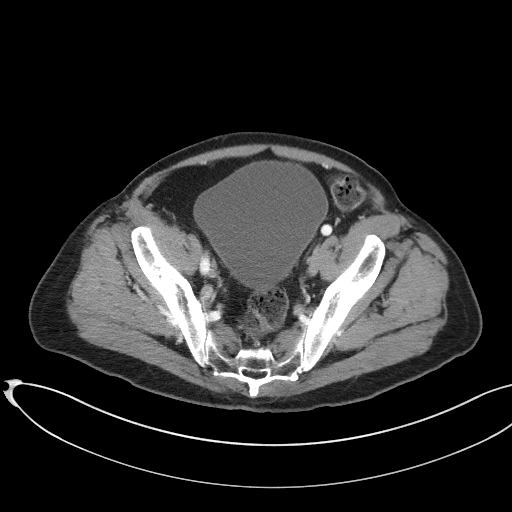
[im 37/91  soft-tissue]
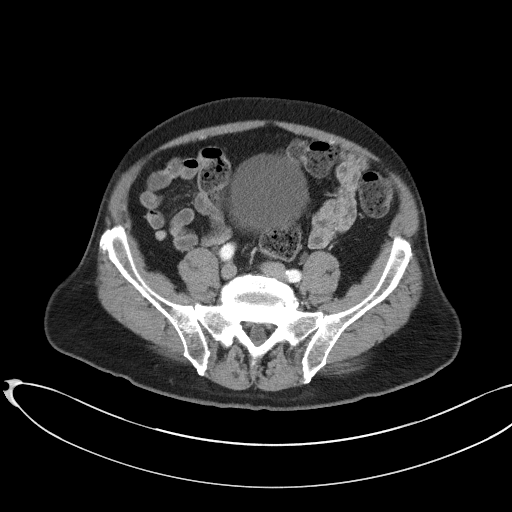
[im 41/91  soft-tissue]
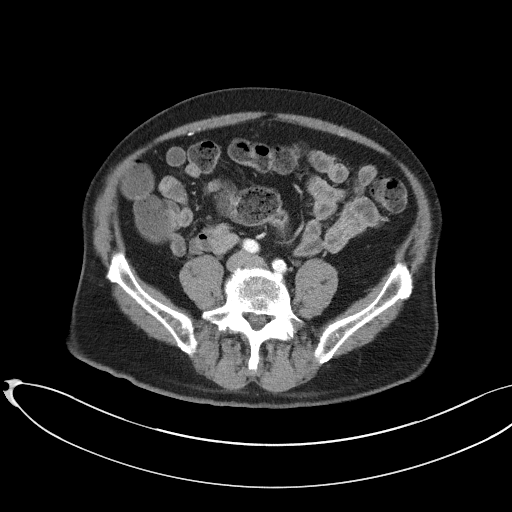
[im 50/91  soft-tissue]
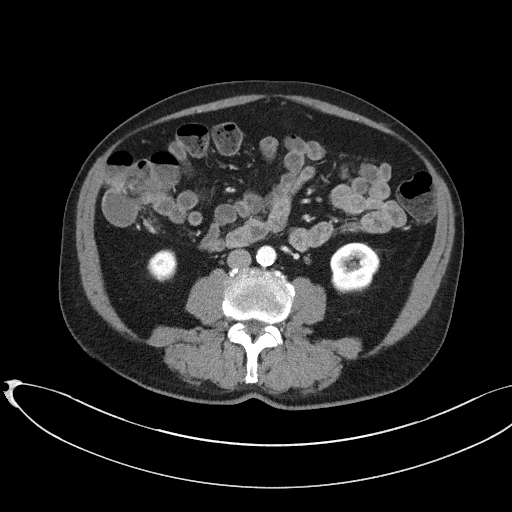
[im 55/91  soft-tissue]
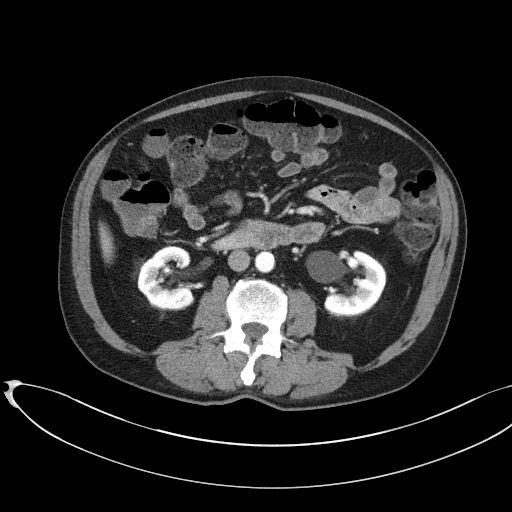
[im 64/91  soft-tissue]
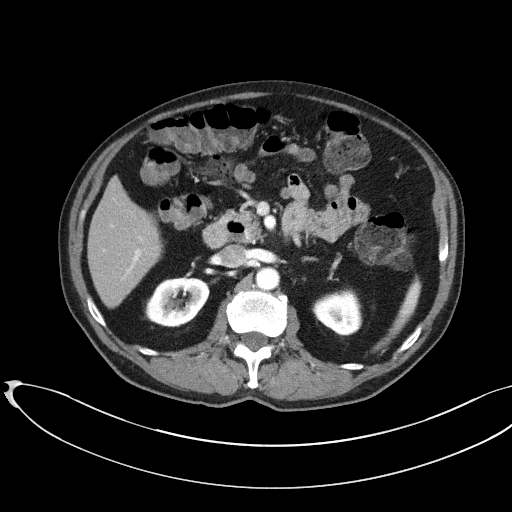
[im 64/91  bone]
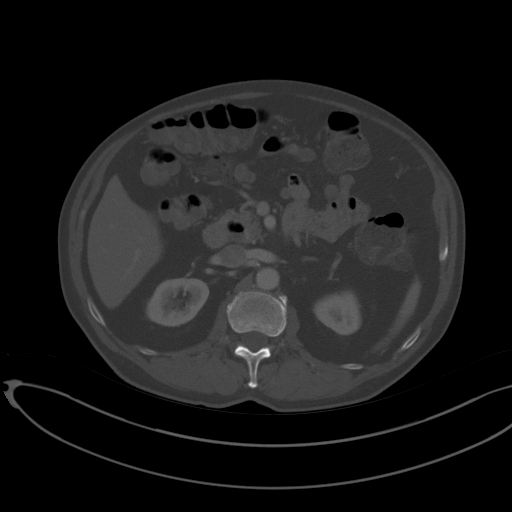
[im 73/91  soft-tissue]
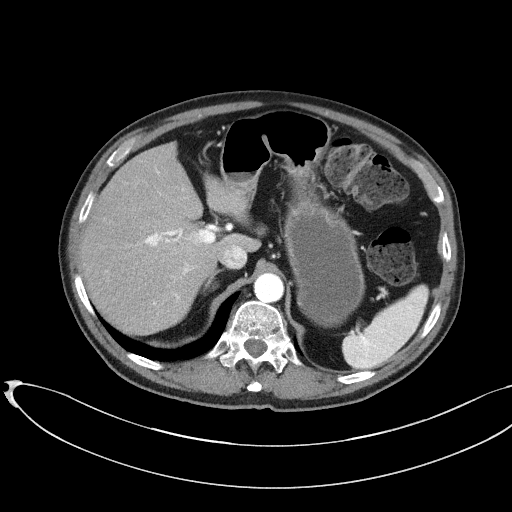
[im 77/91  soft-tissue]
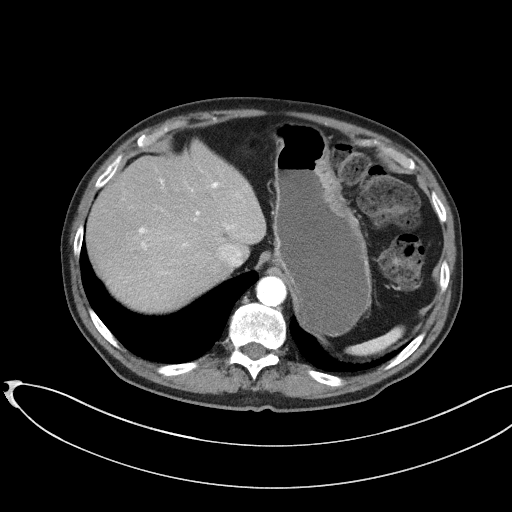
[im 86/91  soft-tissue]
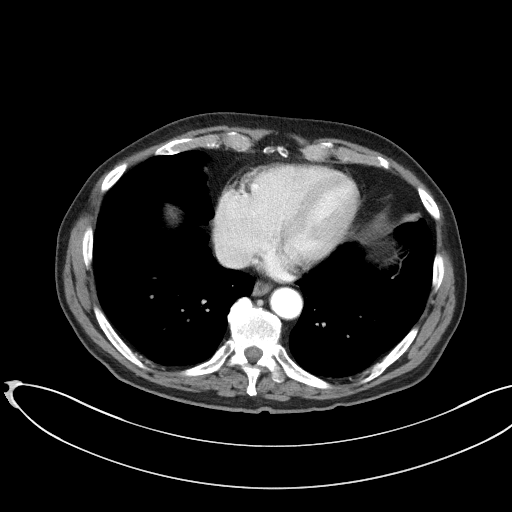

[Series 5: coronal st · coronal · 0.76mm/px · 3 of 98 slices shown]
[im 33/98  soft-tissue]
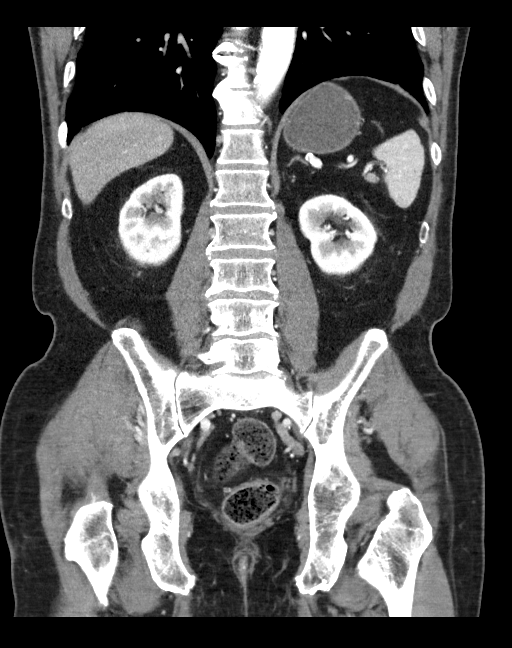
[im 44/98  soft-tissue]
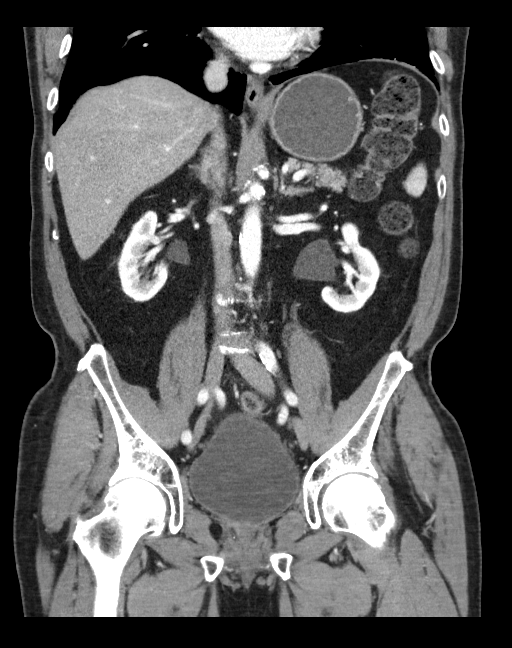
[im 54/98  soft-tissue]
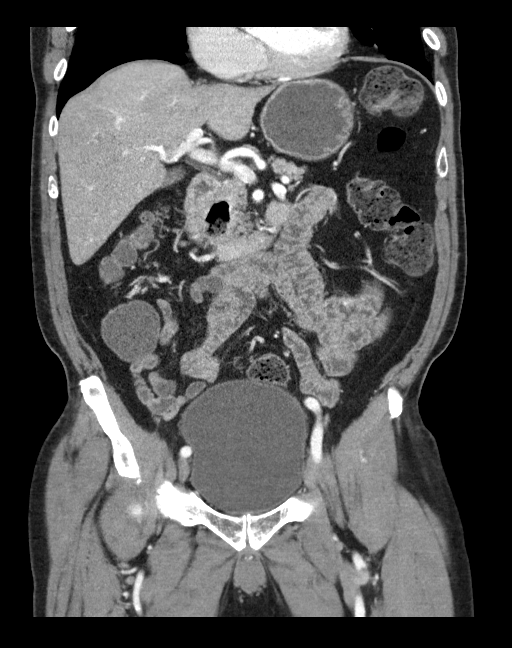

[15 of 46 positions shown; findings below may reference images not displayed]

FINDINGS: Lower chest: Punctate calcified granuloma in the left lower lobe. No
pleural effusion or acute airspace disease. Normal heart size with
coronary artery calcifications.

Hepatobiliary: Diffusely decreased hepatic density typical of
steatosis. No discrete focal lesion. Unremarkable gallbladder. No
calcified gallstone. No biliary dilatation.

Pancreas: No ductal dilatation or inflammation.  No pancreatic mass.

Spleen: Normal in size without focal abnormality.

Adrenals/Urinary Tract: Normal adrenal glands. Prominence of both
renal pelvis, symmetric with homogeneous renal enhancement. There is
symmetric renal excretion. No perinephric edema to suggest renal
obstruction. The urinary bladder is distended. No bladder wall
thickening

Stomach/Bowel: Tiny hiatal hernia. Fluid distended stomach. There is
a small duodenal diverticulum. No small bowel obstruction or
inflammation. Appendix not definitively seen. Fluid in the quad
stool in the ascending colon. Moderate volume of formed stool in the
transverse and descending colon. Moderate stool in the sigmoid colon
which is redundant and courses into the right upper quadrant. Mild
colonic diverticulosis. No diverticulitis. No abnormal rectal
distention. No bowel wall thickening or inflammation.

Vascular/Lymphatic: Aortic atherosclerosis without aneurysm patent
portal vein. No enlarged lymph nodes in the abdomen or pelvis.

Reproductive: Prostatectomy.

Other: No free air, free fluid, or intra-abdominal fluid collection.
Minimal fat containing left inguinal hernia.

Musculoskeletal: Mild for age degenerative change in the spine.
There are no acute or suspicious osseous abnormalities.
IMPRESSION: 1. No acute abnormality in the abdomen/pelvis. Moderate volume of
formed stool throughout the colon, particularly in the sigmoid colon
which is redundant and courses into the right upper quadrant,
constipation pattern. No evidence of obstruction or inflammation.
2. Hepatic steatosis.
3. Distended urinary bladder without wall thickening.

Aortic Atherosclerosis (P6LME-DM6.6).

## 2023-04-03 ENCOUNTER — Encounter: Payer: Self-pay | Admitting: Internal Medicine

## 2023-04-03 ENCOUNTER — Other Ambulatory Visit (HOSPITAL_BASED_OUTPATIENT_CLINIC_OR_DEPARTMENT_OTHER): Payer: Self-pay

## 2023-04-03 MED ORDER — METHOCARBAMOL 500 MG PO TABS
500.0000 mg | ORAL_TABLET | Freq: Two times a day (BID) | ORAL | 0 refills | Status: DC | PRN
  Filled 2023-04-03: qty 60, 30d supply, fill #0

## 2023-04-13 ENCOUNTER — Other Ambulatory Visit (HOSPITAL_BASED_OUTPATIENT_CLINIC_OR_DEPARTMENT_OTHER): Payer: Self-pay

## 2023-04-13 MED ORDER — HYDROCORTISONE 2.5 % EX CREA
1.0000 | TOPICAL_CREAM | Freq: Two times a day (BID) | CUTANEOUS | 0 refills | Status: DC
Start: 2023-04-13 — End: 2023-10-21
  Filled 2023-04-13: qty 30, 14d supply, fill #0

## 2023-04-22 ENCOUNTER — Encounter: Payer: Self-pay | Admitting: Internal Medicine

## 2023-04-22 ENCOUNTER — Ambulatory Visit (INDEPENDENT_AMBULATORY_CARE_PROVIDER_SITE_OTHER): Payer: Medicare Other | Admitting: Internal Medicine

## 2023-04-22 VITALS — BP 116/60 | HR 60 | Temp 98.0°F | Resp 18 | Ht 64.0 in | Wt 144.5 lb

## 2023-04-22 DIAGNOSIS — E782 Mixed hyperlipidemia: Secondary | ICD-10-CM

## 2023-04-22 DIAGNOSIS — I10 Essential (primary) hypertension: Secondary | ICD-10-CM

## 2023-04-22 LAB — LIPID PANEL
Cholesterol: 145 mg/dL (ref 0–200)
HDL: 42.7 mg/dL (ref >39.00)
LDL Cholesterol: 69 mg/dL (ref 0–99)
NonHDL: 102.58
Total CHOL/HDL Ratio: 3
Triglycerides: 168 mg/dL — ABNORMAL HIGH (ref 0.0–149.0)
VLDL: 33.6 mg/dL (ref 0.0–40.0)

## 2023-04-22 NOTE — Progress Notes (Unsigned)
 Subjective:    Patient ID: Michael Johns, male    DOB: 1932/09/15, 89 y.o.   MRN: [id number]  DOS:  04/22/2023 Type of visit - description: Follow-up  Routine checkup, in general feeling well. Chronic medical problems addressed.  Denies any blood in the urine, occasionally has some difficulty urinating.  Taking Robaxin as needed for cramps, helps.  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 Dr  Jeannine [facility] at [facility] in [place].   POLYPECTOMY     PROSTATECTOMY  05/2007   LADS (-) Bone scan (-)   Rt middle ear surgery     sinus polyp removed     TONSILLECTOMY      Current Outpatient Medications  Medication Instructions   amLODipine (NORVASC) 5 mg, Oral, Daily   atorvastatin (LIPITOR) 10 mg, Oral, Daily   augmented betamethasone dipropionate (DIPROLENE-AF) 0.05 % cream Apply 1 Application topically 2 (two) times daily with moisturizer for 14 days   B COMPLEX-BIOTIN-FA PO 1 tablet, Daily   clobetasol (TEMOVATE) 0.05 % external solution Apply 1 Application topically daily for 10 days   Clobetasol Propionate 0.05 % shampoo Apply 1 Application topically once daily for 10 days   hydrochlorothiazide (HYDRODIURIL) 12.5 mg, Oral, Daily   hydrocortisone 2.5 % cream Apply 1 Application topically to ears 2  (two) times daily for 14 days.   lactulose, encephalopathy, (ENULOSE) 10 GM/15ML SOLN TAKE 45 MLS BY MOUTH DAILY  AS NEEDED   methocarbamol (ROBAXIN) 500 mg, Oral, 2 times daily PRN   metoprolol succinate (TOPROL-XL) 25 mg, Oral, Daily, Take with or immediately following a meal   Multiple Vitamin (MULTIVITAMIN) tablet Take 1 tablet by mouth M,W,F   omeprazole (PRILOSEC) 20 MG capsule TAKE 1 CAPSULE BY MOUTH DAILY   OVER THE COUNTER MEDICATION Probiotic 10  - 1 tablet / capsule daily   polyethylene glycol (MIRALAX / GLYCOLAX) 17 g, Every other day   triamcinolone cream (KENALOG) 0.1 % Apply 1 Application (8 grams) topically 2 (two) times daily.       Objective:   Physical Exam BP 116/60   Pulse 60   Temp 98 F (36.7 C) (Oral)   Resp 18   Ht 5\' 4"  (1.626 m)   Wt 144 lb 8 oz (65.5 kg)   SpO2 98%   BMI 24.80 kg/m  General:   Well developed, NAD, BMI noted. HEENT:  Normocephalic . Face symmetric, atraumatic Lungs:  CTA B Normal respiratory effort, no intercostal retractions, no accessory muscle use. Heart: RRR,  no murmur.  Lower extremities: no pretibial edema bilaterally  Skin: Not pale. Not jaundice Neurologic:  alert & oriented X3.  Speech normal, gait appropriate for age and unassisted Psych--  Cognition and judgment appear intact.  Cooperative with normal attention span and concentration.  Behavior appropriate. No anxious or depressed appearing.      Assessment     Assessment HTN Hyperlipidemia Leg cramps-- lipitor d/c in the past, cramps decreased, back on lipitor as cramps are mild ; benefits > risks. CoQ10 helps, CKs normal. rx robaxin 03-2015 Prostate cancer-- prostatectomy 05-2007, urology stopped f/u,  PSA checked by PCP SKIN: ---SCC ---Melanoma, R leg , excision~ July 2020 [facility] GI: GERD,  --Dysphagia-- Rx  EGD 02-2014 (declined,sx chronic, mild) --H. pylori positive, re-checked breathing test negative 06-2015 --Chronic mild constipation, fecal impaction  2012 (admited per pt) and 01-2015 (ER): on MiraLAX qd, senokot or lactulose prn HOH   CP- saw cards, (-) stress test (EET) 10-2015   PLAN: HTN: Reports ambulatory BPs in the 120/60.  Patient wonders about taking HCTZ because it made him urinate more and he is not prone to drink plenty of water.  I am not opposed to stop HCTZ and see how he does. Plan: Stop HCTZ, continue amlodipine, metoprolol, monitor BPs. High cholesterol: Last LFTs normal, on atorvastatin, check FLP. Cramps, hands and legs: He will buy Robaxin which he takes occasionally. RTC 6 months  11 Here for CPX -Td 2023 - pneumonia shot 2009, 2019;  prevnar 2015. PNM 20: 2023 -zostavax 2-09;  s/p  shingrex x 2.  S/p RSV -Had a flu shot and  COVID-vaccine  --CCS: Aged out. --History of prostate cancer, 05/2022 = PSA 0.    --Diet, exercise: Doing great, active, plays golf, take walks, watching his diet, has been able to lose some weight as he liked. -Recent labs reviewed, will check a BMP AST ALT - POA on file  HTN, hyperlipidemia: Well-controlled, continue present meds RTC 6 months.

## 2023-04-22 NOTE — Patient Instructions (Signed)
  Stop hydrochlorothiazide. Continue checking your  blood pressure regularly Blood pressure goal:  between 110/65 and  135/85. If it is consistently higher or lower, let me know     GO TO THE LAB : Get the blood work     Please go to the front desk:  Arrange for a follow-up in 6 months

## 2023-04-23 NOTE — Assessment & Plan Note (Signed)
 HTN: Reports ambulatory BPs in the 120/60.  Patient wonders about stopping HCTZ because it makes him urinate more and he is not prone to drink fluids.  I am not opposed to stop HCTZ and see how he does. Plan: Stop HCTZ, continue amlodipine, metoprolol, monitor BPs. High cholesterol: Last LFTs normal, on atorvastatin, check FLP. Cramps, hands and legs: on Robaxin which he takes occasionally. RTC 6 months

## 2023-04-24 ENCOUNTER — Encounter: Payer: Self-pay | Admitting: Internal Medicine

## 2023-04-26 ENCOUNTER — Other Ambulatory Visit: Payer: Self-pay | Admitting: Internal Medicine

## 2023-04-26 ENCOUNTER — Other Ambulatory Visit: Payer: Self-pay | Admitting: Family

## 2023-04-29 ENCOUNTER — Telehealth: Payer: Self-pay | Admitting: Emergency Medicine

## 2023-04-29 MED ORDER — OMEPRAZOLE 20 MG PO CPDR: 20.0000 mg | DELAYED_RELEASE_CAPSULE | Freq: Every day | ORAL | 1 refills | Status: DC

## 2023-04-29 MED ORDER — METOPROLOL SUCCINATE ER 25 MG PO TB24: 25.0000 mg | ORAL_TABLET | Freq: Every day | ORAL | 1 refills | Status: DC

## 2023-04-29 NOTE — Telephone Encounter (Signed)
 Copied from CRM [id number]. Topic: Clinical - Medication Question >> Apr 29, 2023  3:08 PM Aisha D wrote: Reason for CRM: Patient stated that his pharmacy [facility] sent refills request to Dr.Paz. Patient stated that his pharmacy informed him that Dr.Paz hasn't responded yet and they would need a response before they can refill the medication. Patient stated that the refill request is for metoprolol succinate (TOPROL-XL) 25 MG 24 hr tablet, omeprazole (PRILOSEC) 20 MG capsule and it could be other medications as well.

## 2023-04-29 NOTE — Telephone Encounter (Signed)
 Rxs sent

## 2023-04-29 NOTE — Addendum Note (Signed)
 Addended byConrad Douglassville D on: 04/29/2023 03:15 PM   Modules accepted: Orders

## 2023-05-04 ENCOUNTER — Other Ambulatory Visit (HOSPITAL_BASED_OUTPATIENT_CLINIC_OR_DEPARTMENT_OTHER): Payer: Self-pay

## 2023-05-04 MED ORDER — TRIAMCINOLONE ACETONIDE 0.1 % EX CREA
1.0000 | TOPICAL_CREAM | Freq: Two times a day (BID) | CUTANEOUS | 0 refills | Status: DC
  Filled 2023-05-04: qty 454, 28d supply, fill #0

## 2023-05-18 ENCOUNTER — Other Ambulatory Visit: Payer: Self-pay | Admitting: Internal Medicine

## 2023-05-18 ENCOUNTER — Other Ambulatory Visit (HOSPITAL_BASED_OUTPATIENT_CLINIC_OR_DEPARTMENT_OTHER): Payer: Self-pay

## 2023-05-18 MED ORDER — METHOCARBAMOL 500 MG PO TABS
500.0000 mg | ORAL_TABLET | Freq: Two times a day (BID) | ORAL | 0 refills | Status: DC | PRN
  Filled 2023-05-18: qty 60, 30d supply, fill #0

## 2023-06-13 ENCOUNTER — Other Ambulatory Visit: Payer: Self-pay | Admitting: Internal Medicine

## 2023-07-05 ENCOUNTER — Ambulatory Visit
Admission: EM | Admit: 2023-07-05 | Discharge: 2023-07-05 | Disposition: A | Discharge status: Home / Self Care | Attending: Family Medicine | Admitting: Family Medicine

## 2023-07-05 DIAGNOSIS — S50812A Abrasion of left forearm, initial encounter: Secondary | ICD-10-CM

## 2023-07-05 NOTE — Discharge Instructions (Addendum)
 Change your dressing 2 times daily. Every time you change your dressing, clean the wound gently with warm water and Dial antibacterial soap. Pat the wound dry, let it breathe for roughly an hour before covering it back up. When you reapply a dressing, apply Bacitracin ointment to the wound, then cover with non-stick/non-adherent gauze.  After ~7 days, the wound should scab over nicely and then you don't have to continue doing dressings.

## 2023-07-05 NOTE — ED Triage Notes (Signed)
 Patient presents for a wound check on his left elbow. Patient states he fell 2-3 days ago and has a wound that will not heal.

## 2023-07-05 NOTE — ED Provider Notes (Signed)
 [facility]  Note:  This document was prepared using Conservation officer, historic buildings and may include unintentional dictation errors.  MRN: [id number] DOB: [birth year]  Subjective:   Michael Johns is a 89 y.o. male presenting for 3-day history of a persistent left forearm abrasion.  Patient reports that he has been trying to keep the Band-Aid on it and use some bacitracin ointment but is concerned that it is not scabbing over and sometimes sees a little bleeding.  No drainage of pus, fever, redness, swelling.  No current facility-administered medications for this encounter.  Current Outpatient Medications:    amLODipine  (NORVASC ) 5 MG tablet, TAKE 1 TABLET BY MOUTH DAILY, Disp: 90 tablet, Rfl: 3   atorvastatin  (LIPITOR) 10 MG tablet, TAKE 1 TABLET BY MOUTH DAILY, Disp: 90 tablet, Rfl: 3   hydrocortisone  2.5 % cream, Apply 1 Application topically to ears 2 (two) times daily for 14 days., Disp: 30 g, Rfl: 0   lactulose , encephalopathy, (ENULOSE ) 10 GM/15ML SOLN, TAKE 45 MLS BY MOUTH DAILY AS  NEEDED, Disp: 15 mL, Rfl: 7   methocarbamol  (ROBAXIN ) 500 MG tablet, Take 1 tablet (500 mg total) by mouth 2 (two) times daily as needed for muscle spasms., Disp: 60 tablet, Rfl: 0   metoprolol  succinate (TOPROL -XL) 25 MG 24 hr tablet, Take 1 tablet (25 mg total) by mouth daily. Take with or immediately following a meal, Disp: 90 tablet, Rfl: 1   Multiple Vitamin (MULTIVITAMIN) tablet, Take 1 tablet by mouth M,W,F, Disp: , Rfl:    omeprazole  (PRILOSEC) 20 MG capsule, Take 1 capsule (20 mg total) by mouth daily., Disp: 90 capsule, Rfl: 1   Clobetasol  Propionate 0.05 % shampoo, Apply 1 Application topically once daily for 10 days, Disp: 118 mL, Rfl: 1   polyethylene glycol (MIRALAX / GLYCOLAX) 17 g packet, Take 17 g by mouth every other day., Disp: , Rfl:    triamcinolone  cream (KENALOG ) 0.1 %, Apply 1 Application (8 grams) topically 2 (two) times daily., Disp: 454 g, Rfl: 0    Allergies  Allergen Reactions   Cephalexin Hives    Past Medical History:  Diagnosis Date   Abdominal pain 11/09   due to constipation and ?pancreatitis- admitted   ADENOCARCINOMA, PROSTATE 07/01/2006   Atypical chest pain 04/01/2015   Constipation    chronic, impaction ~2012 (admited) and 01-2015   GERD (gastroesophageal reflux disease)    Gout    see 09-2008 note   Hearing decreased    right, 40% x years   HYPERLIPIDEMIA, MILD 07/18/2009   HYPERTENSION 05/19/2006   Melanoma (HCC)      Past Surgical History:  Procedure Laterality Date   APPENDECTOMY     cataracts Bilateral 2014   lens implantinfo scanned (02-09-17)   INGUINAL HERNIA REPAIR Right 11/2007   MELANOMA EXCISION Right 05/12/2018   R leg melanoma. Dr  Gideon [facility]ussmaul [facility] [place] .   POLYPECTOMY     PROSTATECTOMY  05/2007   LADS (-) Bone scan (-)   Rt middle ear surgery     sinus polyp removed     TONSILLECTOMY      Family History  Problem Relation Age of Onset   Hypertension Mother    Diabetes Mother    Heart disease Mother        CABG 107 y/o   Esophageal cancer Father    Heart disease Maternal Grandmother        MI   Leukemia  Maternal Grandfather    Cancer Other        all paternal uncles-types unknown   Prostate cancer Neg Hx    Colon cancer Neg Hx    Rectal cancer Neg Hx    Stomach cancer Neg Hx     Social History   Tobacco Use   Smoking status: Never   Smokeless tobacco: Never  Substance Use Topics   Alcohol use: Yes    Comment: socially    Drug use: No    ROS   Objective:   Vitals: BP 124/70 (BP Location: Left Arm)   Pulse 60   Temp 97.8 F (36.6 C) (Oral)   Resp 16   SpO2 97%   Physical Exam Constitutional:      General: He is not in acute distress.    Appearance: Normal appearance. He is well-developed and normal weight. He is not ill-appearing, toxic-appearing or diaphoretic.  HENT:     Head: Normocephalic and atraumatic.     Right  Ear: External ear normal.     Left Ear: External ear normal.     Nose: Nose normal.     Mouth/Throat:     Pharynx: Oropharynx is clear.  Eyes:     General: No scleral icterus.       Right eye: No discharge.        Left eye: No discharge.     Extraocular Movements: Extraocular movements intact.  Cardiovascular:     Rate and Rhythm: Normal rate.  Pulmonary:     Effort: Pulmonary effort is normal.  Musculoskeletal:       Arms:     Cervical back: Normal range of motion.  Neurological:     Mental Status: He is alert and oriented to person, place, and time.  Psychiatric:        Mood and Affect: Mood normal.        Behavior: Behavior normal.        Thought Content: Thought content normal.        Judgment: Judgment normal.     Assessment and Plan :   PDMP not reviewed this encounter.  1. Abrasion of left forearm, initial encounter    Offered a referral to wound care, patient declined.  Offered a prescription for an antibiotic ointment to apply for wound care, patient declined.  Dressing was applied to the wound.  Tdap is up-to-date.  Follow-up with PCP.  Counseled patient on potential for adverse effects with medications prescribed/recommended today, ER and return-to-clinic precautions discussed, patient verbalized understanding.    Adolph Hoop, PA-C 07/05/23 1409

## 2023-07-27 ENCOUNTER — Other Ambulatory Visit (HOSPITAL_BASED_OUTPATIENT_CLINIC_OR_DEPARTMENT_OTHER): Payer: Self-pay

## 2023-07-27 MED ORDER — NEOMYCIN-POLYMYXIN-HC 1 % OT SOLN
4.0000 [drp] | Freq: Two times a day (BID) | OTIC | 0 refills | Status: AC
  Filled 2023-07-27: qty 10, 7d supply, fill #0

## 2023-08-19 ENCOUNTER — Other Ambulatory Visit: Payer: Self-pay

## 2023-08-19 ENCOUNTER — Other Ambulatory Visit (HOSPITAL_BASED_OUTPATIENT_CLINIC_OR_DEPARTMENT_OTHER): Payer: Self-pay

## 2023-08-19 MED ORDER — CLOBETASOL PROPIONATE 0.05 % EX CREA
TOPICAL_CREAM | Freq: Two times a day (BID) | CUTANEOUS | 2 refills | Status: DC | PRN
  Filled 2023-08-19: qty 60, 30d supply, fill #0

## 2023-08-29 ENCOUNTER — Other Ambulatory Visit: Payer: Self-pay | Admitting: Internal Medicine

## 2023-09-10 ENCOUNTER — Other Ambulatory Visit: Payer: Self-pay | Admitting: Family

## 2023-09-16 ENCOUNTER — Other Ambulatory Visit: Payer: Self-pay | Admitting: Internal Medicine

## 2023-09-16 MED ORDER — LACTULOSE ENCEPHALOPATHY 10 GM/15ML PO SOLN: ORAL | 1 refills | Status: AC

## 2023-09-16 NOTE — Telephone Encounter (Signed)
 Copied from CRM [id number]. Topic: Clinical - Medication Refill >> Sep 16, 2023  2:29 PM Thersia C wrote: Medication: lactulose , encephalopathy, (ENULOSE ) 10 GM/15ML SOLN  Has the patient contacted their pharmacy? Yes (Agent: If no, request that the patient contact the pharmacy for the refill. If patient does not wish to contact the pharmacy document the reason why and proceed with request.) (Agent: If yes, when and what did the pharmacy advise?)  This is the patient's preferred pharmacy:   [facility] - [place], [place] - [address] [place]  [address] Phone: [phone] Fax: [phone]  Is this the correct pharmacy for this prescription? Yes If no, delete pharmacy and type the correct one.   Has the prescription been filled recently? No  Is the patient out of the medication? Yes  Has the patient been seen for an appointment in the last year OR does the patient have an upcoming appointment? Yes  Can we respond through MyChart? Yes  Agent: Please be advised that Rx refills may take up to 3 business days. We ask that you follow-up with your pharmacy.

## 2023-09-30 ENCOUNTER — Other Ambulatory Visit (HOSPITAL_BASED_OUTPATIENT_CLINIC_OR_DEPARTMENT_OTHER): Payer: Self-pay

## 2023-09-30 MED ORDER — CLOBETASOL PROPIONATE 0.05 % EX SOLN
1.0000 | Freq: Two times a day (BID) | CUTANEOUS | 2 refills | Status: DC | PRN
  Filled 2023-09-30: qty 50, 25d supply, fill #0

## 2023-10-21 ENCOUNTER — Encounter: Payer: Self-pay | Admitting: Internal Medicine

## 2023-10-21 ENCOUNTER — Ambulatory Visit (INDEPENDENT_AMBULATORY_CARE_PROVIDER_SITE_OTHER): Admitting: Internal Medicine

## 2023-10-21 VITALS — BP 122/66 | HR 56 | Temp 97.6°F | Resp 16 | Ht 64.0 in | Wt 140.5 lb

## 2023-10-21 DIAGNOSIS — E782 Mixed hyperlipidemia: Secondary | ICD-10-CM | POA: Diagnosis not present

## 2023-10-21 DIAGNOSIS — I10 Essential (primary) hypertension: Secondary | ICD-10-CM | POA: Diagnosis not present

## 2023-10-21 LAB — BASIC METABOLIC PANEL WITH GFR
BUN: 13 mg/dL (ref 6–23)
CO2: 30 meq/L (ref 19–32)
Calcium: 9.9 mg/dL (ref 8.4–10.5)
Chloride: 99 meq/L (ref 96–112)
Creatinine, Ser: 1.05 mg/dL (ref 0.40–1.50)
GFR: 62.94 mL/min (ref >60.00)
Glucose, Bld: 78 mg/dL (ref 70–99)
Potassium: 4.9 meq/L (ref 3.5–5.1)
Sodium: 135 meq/L (ref 135–145)

## 2023-10-21 LAB — CBC WITH DIFFERENTIAL/PLATELET
Basophils Absolute: 0 K/uL (ref 0.0–0.1)
Basophils Relative: 0.6 % (ref 0.0–3.0)
Eosinophils Absolute: 0.2 K/uL (ref 0.0–0.7)
Eosinophils Relative: 2.6 % (ref 0.0–5.0)
HCT: 39.9 % (ref 39.0–52.0)
Hemoglobin: 13.7 g/dL (ref 13.0–17.0)
Lymphocytes Relative: 29.6 % (ref 12.0–46.0)
Lymphs Abs: 2.1 K/uL (ref 0.7–4.0)
MCHC: 34.4 g/dL (ref 30.0–36.0)
MCV: 87.9 fl (ref 78.0–100.0)
Monocytes Absolute: 0.9 K/uL (ref 0.1–1.0)
Monocytes Relative: 12.9 % — ABNORMAL HIGH (ref 3.0–12.0)
Neutro Abs: 3.8 K/uL (ref 1.4–7.7)
Neutrophils Relative %: 54.3 % (ref 43.0–77.0)
Platelets: 240 K/uL (ref 150.0–400.0)
RBC: 4.54 Mil/uL (ref 4.22–5.81)
RDW: 14.5 % (ref 11.5–15.5)
WBC: 7 K/uL (ref 4.0–10.5)

## 2023-10-21 NOTE — Patient Instructions (Signed)
   Check the  blood pressure regularly Blood pressure goal:  between 110/65 and  135/85. If it is consistently higher or lower, let me know     GO TO THE LAB :  Get the blood work   Your results will be posted on MyChart with my comments  Go to the front desk for the checkout Please make an appointment for a physical exam in 3 to 4 months

## 2023-10-21 NOTE — Progress Notes (Signed)
 Subjective:    Patient ID: Michael Johns, male    DOB: 19-Jan-1933, 89 y.o.   MRN: [id number]  DOS:  10/21/2023 Type of visit - description: Follow-up  Chronic medical problems addressed. Feeling great. Plays golf regularly. Denies chest pain or difficulty breathing. No LUTS.  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 Dr  Lloyd at [facility] in [place] .   POLYPECTOMY     PROSTATECTOMY  05/2007   LADS (-) Bone scan (-)   Rt middle ear surgery     sinus polyp removed     TONSILLECTOMY      Current Outpatient Medications  Medication Instructions   amLODipine  (NORVASC ) 5 mg, Oral, Daily   atorvastatin  (LIPITOR) 10 mg, Oral, Daily   clobetasol  (TEMOVATE ) 0.05 % external solution Apply 1 Application topically 2 (two) times daily as needed for itchy spots on the scalp.   clobetasol  cream (TEMOVATE ) 0.05 % Apply topically 2 (two) times daily as needed for itchy spots. *Do not apply to sensitive areas like face or groin.*   Clobetasol  Propionate 0.05 % shampoo Apply 1 Application topically once daily for 10 days   hydrocortisone  2.5 % cream Apply 1 Application topically to ears 2 (two) times daily for 14 days.   lactulose , encephalopathy, (ENULOSE ) 10 GM/15ML SOLN Take 45mL by mouth daily as needed    methocarbamol  (ROBAXIN ) 500 mg, Oral, 2 times daily PRN   metoprolol  succinate (TOPROL -XL) 25 mg, Oral, Daily, Take with or immediately following a meal   Multiple Vitamin (MULTIVITAMIN) tablet Take 1 tablet by mouth M,W,F   omeprazole  (PRILOSEC) 20 mg, Oral, Daily   polyethylene glycol (MIRALAX / GLYCOLAX) 17 g, Every other day   triamcinolone  cream (KENALOG ) 0.1 % Apply 1 Application (8 grams) topically 2 (two) times daily.       Objective:   Physical Exam BP 122/66   Pulse (!) 56   Temp 97.6 F (36.4 C) (Oral)   Resp 16   Ht 5' 4 (1.626 m)   Wt 140 lb 8 oz (63.7 kg)   SpO2 99%   BMI 24.12 kg/m  General:   Well developed, NAD, BMI noted. HEENT:  Normocephalic . Face symmetric, atraumatic Lungs:  CTA B Normal respiratory effort, no intercostal retractions, no accessory muscle use. Heart: RRR,  no murmur.  Lower extremities: no pretibial edema bilaterally  Skin: Not pale. Not jaundice Neurologic:  alert & oriented X3.  Speech normal, gait appropriate for age and unassisted Psych--  Cognition and judgment appear intact.  Cooperative with normal attention span and concentration.  Behavior appropriate. No anxious or depressed appearing.      Assessment    Assessment HTN Hyperlipidemia Leg cramps-- lipitor d/c in the past, cramps decreased,  back on lipitor as cramps are mild ; benefits > risks. CoQ10 helps, CKs normal. rx robaxin  03-2015 Prostate cancer-- prostatectomy 05-2007, urology stopped f/u,  PSA checked by PCP SKIN: ---[facility] ---Melanoma, R leg , excision~ July 2020 [facility] GI: GERD,  --Dysphagia-- Rx  EGD 02-2014 (declined,sx chronic, mild) --H. pylori positive, re-checked breathing test negative 06-2015 --Chronic mild constipation, fecal impaction 2012 (admited per pt) and 01-2015 (ER): on MiraLAX qd, senokot or lactulose  prn HOH   CP- saw cards, (-) stress test (EET) 10-2015   PLAN: HTN: At the last visit, HCTZ discontinued, ambulatory BPs have been checked  sporadically and they are normal.  BP today is satisfactory.  Continue amlodipine , metoprolol , check BMP and CBC. High cholesterol: Controlled on atorvastatin  Skin cancer, h/o: Per dermatology. Preventive care: Plans to get a flu shot, encouraged to proceed also with a COVID booster. RTC 3 to 4 months CPX

## 2023-10-21 NOTE — Assessment & Plan Note (Signed)
 HTN: At the last visit, HCTZ discontinued, ambulatory BPs have been checked sporadically and they are normal.  BP today is satisfactory.  Continue amlodipine , metoprolol , check BMP and CBC. High cholesterol: Controlled on atorvastatin  Skin cancer, h/o: Per dermatology. Preventive care: Plans to get a flu shot, encouraged to proceed also with a COVID booster. RTC 3 to 4 months CPX

## 2023-10-22 ENCOUNTER — Ambulatory Visit: Payer: Self-pay | Admitting: Internal Medicine

## 2023-10-26 ENCOUNTER — Other Ambulatory Visit (HOSPITAL_BASED_OUTPATIENT_CLINIC_OR_DEPARTMENT_OTHER): Payer: Self-pay

## 2023-10-26 MED ORDER — FLUZONE HIGH-DOSE 0.5 ML IM SUSY
0.5000 mL | PREFILLED_SYRINGE | Freq: Once | INTRAMUSCULAR | 0 refills | Status: AC
  Filled 2023-10-26: qty 0.5, 1d supply, fill #0

## 2023-11-08 ENCOUNTER — Other Ambulatory Visit: Payer: Self-pay | Admitting: Internal Medicine

## 2023-11-09 ENCOUNTER — Other Ambulatory Visit (HOSPITAL_BASED_OUTPATIENT_CLINIC_OR_DEPARTMENT_OTHER): Payer: Self-pay

## 2023-11-09 MED ORDER — COMIRNATY 30 MCG/0.3ML IM SUSY
0.3000 mL | PREFILLED_SYRINGE | Freq: Once | INTRAMUSCULAR | 0 refills | Status: AC
  Filled 2023-11-09: qty 0.3, 1d supply, fill #0

## 2023-11-23 ENCOUNTER — Other Ambulatory Visit (HOSPITAL_BASED_OUTPATIENT_CLINIC_OR_DEPARTMENT_OTHER): Payer: Self-pay

## 2023-11-23 MED ORDER — CLOBETASOL PROPIONATE 0.05 % EX CREA
1.0000 | TOPICAL_CREAM | Freq: Two times a day (BID) | CUTANEOUS | 2 refills | Status: AC | PRN
  Filled 2023-11-23: qty 60, 30d supply, fill #0
  Filled 2024-02-18: qty 60, 30d supply, fill #1

## 2023-12-25 ENCOUNTER — Other Ambulatory Visit (HOSPITAL_BASED_OUTPATIENT_CLINIC_OR_DEPARTMENT_OTHER): Payer: Self-pay

## 2024-01-04 ENCOUNTER — Other Ambulatory Visit: Payer: Self-pay | Admitting: Internal Medicine

## 2024-02-11 ENCOUNTER — Other Ambulatory Visit: Payer: Self-pay | Admitting: Internal Medicine

## 2024-02-12 ENCOUNTER — Other Ambulatory Visit (HOSPITAL_BASED_OUTPATIENT_CLINIC_OR_DEPARTMENT_OTHER): Payer: Self-pay

## 2024-02-12 MED ORDER — METHOCARBAMOL 500 MG PO TABS
500.0000 mg | ORAL_TABLET | Freq: Two times a day (BID) | ORAL | 0 refills | Status: AC | PRN
  Filled 2024-02-12: qty 60, 30d supply, fill #0

## 2024-02-18 ENCOUNTER — Other Ambulatory Visit (HOSPITAL_BASED_OUTPATIENT_CLINIC_OR_DEPARTMENT_OTHER): Payer: Self-pay

## 2024-02-26 ENCOUNTER — Encounter: Payer: Self-pay | Admitting: Internal Medicine

## 2024-02-26 ENCOUNTER — Ambulatory Visit (INDEPENDENT_AMBULATORY_CARE_PROVIDER_SITE_OTHER): Admitting: Internal Medicine

## 2024-02-26 VITALS — BP 134/70 | HR 62 | Temp 97.6°F | Resp 16 | Ht 64.0 in | Wt 143.0 lb

## 2024-02-26 DIAGNOSIS — E038 Other specified hypothyroidism: Secondary | ICD-10-CM

## 2024-02-26 DIAGNOSIS — R6889 Other general symptoms and signs: Secondary | ICD-10-CM | POA: Diagnosis not present

## 2024-02-26 DIAGNOSIS — I10 Essential (primary) hypertension: Secondary | ICD-10-CM

## 2024-02-26 DIAGNOSIS — Z8546 Personal history of malignant neoplasm of prostate: Secondary | ICD-10-CM

## 2024-02-26 DIAGNOSIS — E782 Mixed hyperlipidemia: Secondary | ICD-10-CM | POA: Diagnosis not present

## 2024-02-26 LAB — LIPID PANEL
Cholesterol: 162 mg/dL (ref 28–200)
HDL: 48.1 mg/dL
LDL Cholesterol: 85 mg/dL (ref 10–99)
NonHDL: 114.26
Total CHOL/HDL Ratio: 3
Triglycerides: 145 mg/dL (ref 10.0–149.0)
VLDL: 29 mg/dL (ref 0.0–40.0)

## 2024-02-26 LAB — ALT: ALT: 20 U/L (ref 3–53)

## 2024-02-26 LAB — PSA: PSA: 0 ng/mL — ABNORMAL LOW (ref 0.10–4.00)

## 2024-02-26 LAB — AST: AST: 20 U/L (ref 5–37)

## 2024-02-26 LAB — TSH: TSH: 5.08 u[IU]/mL (ref 0.35–5.50)

## 2024-02-26 LAB — T4, FREE: Free T4: 0.61 ng/dL (ref 0.60–1.60)

## 2024-02-26 NOTE — Patient Instructions (Signed)
 Please read your instructions carefully.   GO TO THE LAB :  Get the blood work    Go to the front desk for the checkout Please make an appointment for a physical exam in 5 to 6 months     Continue checking your blood pressure regularly Blood pressure goal:  between 110/65 and  130/80. If it is consistently higher or lower, let me know  Okay to take less omeprazole : Take 1 tablet every other day then 1 tablet twice a week. If the heartburn returns go back to daily omeprazole .

## 2024-02-26 NOTE — Assessment & Plan Note (Signed)
 Cold intolerance Chronic cold intolerance, could be age-related. Advised wearing gloves for warmth. Call if worse HTN Controlled.  Continue metoprolol  and amlodipine .  Last BMP okay Hyperlipidemia  Continue atorvastatin . Ordered lipid panel (FLP, AST, ALT). GERD GERD well-controlled with omeprazole , would like to take less medication, okay to decrease omeprazole  gradually, see AVS, discussed rebound reflux risk. Subclinical hypothyroidism Previous elevated TSH levels, recheck today. Chronic constipation Intermittent constipation managed with medicines as needed History of prostate cancer Asymptomatic.  Ordered PSA test. General health maintenance: Had a flu and a COVID-vaccine  RTC CPX 5 to 6 months

## 2024-02-26 NOTE — Progress Notes (Signed)
 "  Subjective:    Patient ID: Michael Johns, male    DOB: 01-15-1933, 89 y.o.   MRN: [id number]  DOS:  02/26/2024 Follow-up  Discussed the use of AI scribe software for clinical note transcription with the patient, who gave verbal consent to proceed.  History of Present Illness  He reports cold hands, most noticeable indoors such as at mass, where others comment on how cold he feels. He denies associated color changes, including redness or pallor. He has subclinical hypothyroidism with previously elevated TSH. He has prostate cancer. Current medications include metoprolol , amlodipine , atorvastatin , Robaxin , and omeprazole , which he takes daily for acid reflux with good symptom control.    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 Dr  Lloyd at [facility] in [place] .   POLYPECTOMY     PROSTATECTOMY  05/2007   LADS (-) Bone scan (-)   Rt middle ear surgery     sinus polyp removed     TONSILLECTOMY      Current Outpatient Medications  Medication Instructions   amLODipine  (NORVASC ) 5 mg, Oral, Daily   atorvastatin  (LIPITOR) 10 mg, Oral, Daily   clobetasol  cream (TEMOVATE ) 0.05 % 1 Application, Topical, 2 times daily PRN, Do not use on sensitive areas like the face/groin.   lactulose , encephalopathy,  (ENULOSE ) 10 GM/15ML SOLN Take 45mL by mouth daily as needed   methocarbamol  (ROBAXIN ) 500 mg, Oral, 2 times daily PRN   metoprolol  succinate (TOPROL -XL) 25 mg, Oral, Daily, Take with or immediately following a meal   Multiple Vitamin (MULTIVITAMIN) tablet Take 1 tablet by mouth M,W,F   omeprazole  (PRILOSEC) 20 mg, Oral, Daily   polyethylene glycol (MIRALAX / GLYCOLAX) 17 g, Every other day       Objective:   Physical Exam BP 134/70   Pulse 62   Temp 97.6 F (36.4 C) (Oral)   Resp 16   Ht 5' 4 (1.626 m)   Wt 143 lb (64.9 kg)   SpO2 99%   BMI 24.55 kg/m  General:   Well developed, NAD, BMI noted. HEENT:  Normocephalic . Face symmetric, atraumatic Lungs:  CTA B Normal respiratory effort, no intercostal retractions, no accessory muscle use. Heart: RRR,  no murmur.  Lower extremities: no pretibial edema bilaterally Upper extremities: Normal radial pulses, fingers well-perfused Skin: Not pale. Not jaundice Neurologic:  alert & oriented X3.  Speech normal, gait appropriate for age and unassisted Psych--  Cognition and judgment appear intact.  Cooperative with normal attention span and concentration.  Behavior appropriate. No anxious or depressed appearing.      Assessment    Assessment HTN Hyperlipidemia Leg cramps-- lipitor d/c in the past, cramps decreased, back on lipitor as cramps are mild ; benefits > risks.  CoQ10 helps, CKs normal. rx robaxin  03-2015 Prostate cancer-- prostatectomy 05-2007, urology stopped f/u,  PSA checked by PCP SKIN: ---SCC ---Melanoma, R leg , excision~ July 2020 [facility] GI: GERD,  --Dysphagia-- Rx  EGD 02-2014 (declined,sx chronic, mild) --H. pylori positive, re-checked breathing test negative 06-2015 --Chronic mild constipation, fecal impaction 2012 (admited per pt) and 01-2015 (ER): on MiraLAX qd, senokot or lactulose  prn HOH   CP- saw cards, (-) stress test (EET) 10-2015   Assessment & Plan Cold intolerance Chronic cold intolerance,  could be age-related. Advised wearing gloves for warmth. Call if worse HTN Controlled.  Continue metoprolol  and amlodipine .  Last BMP okay Hyperlipidemia  Continue atorvastatin . Ordered lipid panel (FLP, AST, ALT). GERD GERD well-controlled with omeprazole , would like to take less medication, okay to decrease omeprazole  gradually, see AVS, discussed rebound reflux risk. Subclinical hypothyroidism Previous elevated TSH levels, recheck today. Chronic constipation Intermittent constipation managed with medicines as needed History of prostate cancer Asymptomatic.  Ordered PSA test. General health maintenance: Had a flu and a COVID-vaccine  RTC CPX 5 to 6 months   "

## 2024-02-29 ENCOUNTER — Ambulatory Visit: Payer: Self-pay | Admitting: Internal Medicine

## 2024-07-27 ENCOUNTER — Encounter: Admitting: Internal Medicine
